# Patient Record
Sex: Female | Born: 1970
Health system: Southern US, Community
[De-identification: ages and names within clinical notes are randomized; demographics above are authoritative.]

## PROBLEM LIST (undated history)

## (undated) DIAGNOSIS — G2581 Restless legs syndrome: Secondary | ICD-10-CM

## (undated) DIAGNOSIS — M1990 Unspecified osteoarthritis, unspecified site: Secondary | ICD-10-CM

## (undated) DIAGNOSIS — K219 Gastro-esophageal reflux disease without esophagitis: Secondary | ICD-10-CM

## (undated) DIAGNOSIS — F319 Bipolar disorder, unspecified: Secondary | ICD-10-CM

## (undated) DIAGNOSIS — M719 Bursopathy, unspecified: Secondary | ICD-10-CM

## (undated) DIAGNOSIS — K589 Irritable bowel syndrome without diarrhea: Secondary | ICD-10-CM

## (undated) DIAGNOSIS — G43909 Migraine, unspecified, not intractable, without status migrainosus: Secondary | ICD-10-CM

## (undated) DIAGNOSIS — E559 Vitamin D deficiency, unspecified: Secondary | ICD-10-CM

## (undated) DIAGNOSIS — M797 Fibromyalgia: Principal | ICD-10-CM

## (undated) DIAGNOSIS — G4761 Periodic limb movement disorder: Secondary | ICD-10-CM

## (undated) DIAGNOSIS — G5622 Lesion of ulnar nerve, left upper limb: Secondary | ICD-10-CM

## (undated) HISTORY — DX: Restless legs syndrome: G25.81

## (undated) HISTORY — PX: ABDOMINAL HYSTERECTOMY: SHX81

## (undated) HISTORY — DX: Unspecified osteoarthritis, unspecified site: M19.90

## (undated) HISTORY — DX: Bursopathy, unspecified: M71.9

## (undated) HISTORY — DX: Periodic limb movement disorder: G47.61

## (undated) HISTORY — DX: Lesion of ulnar nerve, left upper limb: G56.22

## (undated) HISTORY — DX: Vitamin D deficiency, unspecified: E55.9

## (undated) HISTORY — DX: Gastro-esophageal reflux disease without esophagitis: K21.9

## (undated) HISTORY — DX: Fibromyalgia: M79.7

## (undated) HISTORY — DX: Bipolar disorder, unspecified: F31.9

## (undated) HISTORY — DX: Irritable bowel syndrome without diarrhea: K58.9

## (undated) HISTORY — DX: Migraine, unspecified, not intractable, without status migrainosus: G43.909

---

## 1999-03-05 ENCOUNTER — Ambulatory Visit: Admission: RE | Admit: 1999-03-05 | Discharge: 1999-03-05 | Payer: Self-pay | Admitting: Family Medicine

## 1999-12-13 ENCOUNTER — Other Ambulatory Visit: Admission: RE | Admit: 1999-12-13 | Discharge: 1999-12-13 | Payer: Self-pay | Admitting: Family Medicine

## 2000-04-02 ENCOUNTER — Other Ambulatory Visit: Admission: RE | Admit: 2000-04-02 | Discharge: 2000-04-02 | Payer: Self-pay | Admitting: Family Medicine

## 2000-05-27 ENCOUNTER — Encounter: Payer: Self-pay | Admitting: *Deleted

## 2000-05-27 ENCOUNTER — Ambulatory Visit (HOSPITAL_COMMUNITY): Admission: RE | Admit: 2000-05-27 | Discharge: 2000-05-27 | Payer: Self-pay | Admitting: *Deleted

## 2000-09-06 ENCOUNTER — Other Ambulatory Visit: Admission: RE | Admit: 2000-09-06 | Discharge: 2000-09-06 | Payer: Self-pay | Admitting: Family Medicine

## 2001-12-16 ENCOUNTER — Other Ambulatory Visit: Admission: RE | Admit: 2001-12-16 | Discharge: 2001-12-16 | Payer: Self-pay | Admitting: Obstetrics and Gynecology

## 2002-06-11 ENCOUNTER — Other Ambulatory Visit: Admission: RE | Admit: 2002-06-11 | Discharge: 2002-06-11 | Payer: Self-pay | Admitting: Obstetrics and Gynecology

## 2002-10-27 ENCOUNTER — Emergency Department (HOSPITAL_COMMUNITY): Admission: EM | Admit: 2002-10-27 | Discharge: 2002-10-27 | Payer: Self-pay | Admitting: Emergency Medicine

## 2003-06-18 ENCOUNTER — Other Ambulatory Visit: Admission: RE | Admit: 2003-06-18 | Discharge: 2003-06-18 | Payer: Self-pay | Admitting: Obstetrics and Gynecology

## 2003-10-06 ENCOUNTER — Emergency Department (HOSPITAL_COMMUNITY): Admission: EM | Admit: 2003-10-06 | Discharge: 2003-10-06 | Payer: Self-pay | Admitting: Emergency Medicine

## 2004-07-27 ENCOUNTER — Other Ambulatory Visit: Admission: RE | Admit: 2004-07-27 | Discharge: 2004-07-27 | Payer: Self-pay | Admitting: Obstetrics and Gynecology

## 2004-10-03 ENCOUNTER — Encounter (INDEPENDENT_AMBULATORY_CARE_PROVIDER_SITE_OTHER): Payer: Self-pay | Admitting: *Deleted

## 2004-10-03 ENCOUNTER — Ambulatory Visit (HOSPITAL_BASED_OUTPATIENT_CLINIC_OR_DEPARTMENT_OTHER): Admission: RE | Admit: 2004-10-03 | Discharge: 2004-10-03 | Payer: Self-pay | Admitting: Obstetrics and Gynecology

## 2004-10-03 ENCOUNTER — Ambulatory Visit (HOSPITAL_COMMUNITY): Admission: RE | Admit: 2004-10-03 | Discharge: 2004-10-03 | Payer: Self-pay | Admitting: Obstetrics and Gynecology

## 2004-10-24 ENCOUNTER — Encounter (INDEPENDENT_AMBULATORY_CARE_PROVIDER_SITE_OTHER): Payer: Self-pay | Admitting: *Deleted

## 2004-10-24 ENCOUNTER — Inpatient Hospital Stay (HOSPITAL_COMMUNITY): Admission: RE | Admit: 2004-10-24 | Discharge: 2004-10-27 | Payer: Self-pay | Admitting: Obstetrics and Gynecology

## 2005-08-31 ENCOUNTER — Emergency Department (HOSPITAL_COMMUNITY): Admission: EM | Admit: 2005-08-31 | Discharge: 2005-08-31 | Payer: Self-pay | Admitting: Emergency Medicine

## 2005-11-15 ENCOUNTER — Other Ambulatory Visit: Admission: RE | Admit: 2005-11-15 | Discharge: 2005-11-15 | Payer: Self-pay | Admitting: Obstetrics and Gynecology

## 2006-09-26 ENCOUNTER — Encounter: Admission: RE | Admit: 2006-09-26 | Discharge: 2006-09-26 | Payer: Self-pay | Admitting: Gastroenterology

## 2006-11-06 ENCOUNTER — Encounter: Admission: RE | Admit: 2006-11-06 | Discharge: 2006-11-06 | Payer: Self-pay | Admitting: Obstetrics and Gynecology

## 2007-08-23 ENCOUNTER — Emergency Department (HOSPITAL_COMMUNITY): Admission: EM | Admit: 2007-08-23 | Discharge: 2007-08-23 | Payer: Self-pay | Admitting: Emergency Medicine

## 2010-09-15 NOTE — Op Note (Signed)
Felicia Rivera, ROSSA                ACCOUNT NO.:  [id number]   MEDICAL RECORD NO.:  [id number]          PATIENT TYPE:  INP   LOCATION:  9317                          FACILITY:  [facility]   PHYSICIAN:  Huel Cote, M.D. DATE OF BIRTH:  [birth year]   DATE OF PROCEDURE:  10/24/2004  DATE OF DISCHARGE:                                 OPERATIVE REPORT   PREOPERATIVE DIAGNOSES:  1.  Adenocarcinoma in situ of cervix.  2.  Dysmenorrhea.  3.  Menorrhagia.   POSTOPERATIVE DIAGNOSES:  1.  Adenocarcinoma in situ of cervix.  2.  Dysmenorrhea.  3.  Menorrhagia.   PROCEDURE:  Total abdominal hysterectomy.   SURGEON:  Huel Cote, M.D.   ASSISTANT:  Malachi Pro. Ambrose Mantle, M.D.   ANESTHESIA:  General.   SPECIMENS:  Uterus and cervix were sent.   ESTIMATED BLOOD LOSS:  100 cc.   URINE OUTPUT:  125 cc.   FLUIDS REPLACED:  1700 cc.   FINDINGS:  There was a small uterus.  Ovaries, tubes, appendix, and pelvis  were all normal.  There was no pathology noted.  No endometriosis noted.   DESCRIPTION OF PROCEDURE:  The patient was taken to the operating room where  general anesthesia was obtained without difficulty.  She was then prepped  and draped in the usual sterile fashion in the dorsal supine position.  A  Pfannenstiel incision was then made with a scalpel and carried through to  the underlying layer of fascia with Bovie dissection and sharp dissection.  The fascia was nicked in the midline and the incision was extended laterally  with Mayo scissors.  The inferior aspect was grasped with Kocher clamps,  elevated, and dissected off the rectus muscles.  In a similar fashion, the  superior aspect was dissected off the rectus muscles.  The rectus muscles  were separated and the peritoneal cavity entered bluntly.  The peritoneal  incision was then extended both superiorly and inferiorly with careful  attention to avoid both bowel and bladder.  The wound retractor, Alexis self-  retaining  retractor, was then placed in the incision and the bowel packed  away with moist laparotomy sponges.  The uterus and ovaries were inspected  and found to be normal in appearance.  Therefore, the uterus was grasped at  each cornu with a Kelly clamp.  The round ligament was transected with Bovie  cautery.  A window was made in the broad ligament and the Kleppinger clamps  placed across the utero-ovarian ligaments.  The utero-ovarian ligaments were  then transected bilaterally and suture ligated x2 with 0 Vicryl.  These were  found to be hemostatic.  The uterine arteries were then skeletonized  bilaterally and clamped with Kleppinger clamps.  The bladder flap was  created with Metzenbaum scissors and pushed away from the underlying cervix.  The uterine arteries were then transected and secured with a suture ligature  of 0 Vicryl.  Additional clamps of the uterosacral ligament and remaining  paracervical tissue were taken with slightly curved Kleppinger clamps.  Each  step was secured with a suture of 0 Vicryl.  The cervix was then clamped  across with slightly curved Zeplin clamps and completely amputated.  The  cuff was then closed with 0 Vicryl suture ligatures and the midline closed  with a figure-of-eight suture as well.  There was no active bleeding noted  at this point.  The ureters were inspected and found to be normal in caliber  and well away from the surgical field and, therefore, the uterosacral  ligaments were reapproximated with a suture of 0 Vicryl.  All sutures were  then removed from the abdomen.  All instruments were removed including the  retractor and one area of bleeding controlled on the omentum with Bovie  cautery.  The fascia was then closed with 0 Vicryl in a running fashion.  The subcutaneous tissue was reapproximated with 0 plain and the skin was  closed with staples.  Sponge, laparotomy pad, and needle counts were correct  x2.  The patient was taken to the recovery  room in stable condition.       KR/MEDQ  D:  10/24/2004  T:  10/24/2004  Job:  [id number]

## 2010-09-15 NOTE — Discharge Summary (Signed)
Felicia Rivera, PEPPERMAN                ACCOUNT NO.:  [id number]   MEDICAL RECORD NO.:  [id number]          PATIENT TYPE:  INP   LOCATION:  9317                          FACILITY:  [facility]   PHYSICIAN:  Huel Cote, M.D. DATE OF BIRTH:  June 18, 1970   DATE OF ADMISSION:  10/24/2004  DATE OF DISCHARGE:  10/27/2004                                 DISCHARGE SUMMARY   DISCHARGE DIAGNOSES:  1.  Adenocarcinoma in situ of the cervix status post cold knife conization      with a questionable margin.  2.  Menorrhagia and dysmenorrhea.  3.  Status post total abdominal hysterectomy.   DISCHARGE MEDICATIONS:  1.  Percocet one to two tablets p.o. every 4 hours p.r.n.  2.  Motrin 600 mg p.o. every 6 hours p.r.n.  3.  The patient also will resume several preoperative medications, Effexor      and Klonopin, as she had previously took them.   DISCHARGE FOLLOWUP:  The patient is to follow up in the office in  approximately 2 weeks for incision check.   HOSPITAL COURSE:  The patient is a 40 year old nulligravida female who  underwent a total abdominal hysterectomy on October 24, 2004 for a finding of  adenocarcinoma in situ of the cervix which was questionably still present on  a cold knife conization. The patient also had a significant history of  menorrhagia and dysmenorrhea and for this reason had requested an abdominal  hysterectomy to treat both that condition and the possible residual  adenocarcinoma in situ. She underwent a total abdominal hysterectomy without  difficulty and was admitted for routine postoperative care. She did  extremely well and on postoperative day #3 was tolerating regular diet,  ambulating without difficulty. Abdomen was soft. Her incision was clear with  Steri-Strips. Her postoperative hemoglobin was 10.7 and she was felt stable  for discharge home. She was discharged home with follow-up as previously  stated and will be seen in the office at that time.      Huel Cote, M.D.  Electronically Signed     KR/MEDQ  D:  12/12/2004  T:  12/12/2004  Job:  [id number]

## 2010-09-15 NOTE — H&P (Signed)
NAME:  Felicia, Rivera NO.:  [id number]   MEDICAL RECORD NO.:  [id number]           PATIENT TYPE:   LOCATION:                                 FACILITY:   PHYSICIAN:  Huel Cote, M.D.      DATE OF BIRTH:   DATE OF ADMISSION:  10/24/2004  DATE OF DISCHARGE:                                HISTORY & PHYSICAL   HISTORY OF PRESENT ILLNESS:  The patient is a 40 year old nulligravida  female who has come again for a scheduled total abdominal hysterectomy,  given a finding of adenocarcinoma in situ on her cervix.  The patient has  had this followed up with a cold knife conization which had a questionable  margin in the deep endocervix area.  Previous to her cold knife cone, the  patient had expressed a desire to proceeding with hysterectomy, irregardless  of her pathological diagnosis on the conization, not only for prevention of  any further cervical cancer issues, but also for a long history of  dysmenorrhea and menorrhagia, which has not responded well to medical  treatment.   PAST MEDICAL HISTORY:  1.  Significant for migraines.  2.  Fibromyalgia.  3.  Restless leg syndrome.   PAST SURGICAL HISTORY:  1.  She has had her wisdom teeth removed.  2.  The cold knife conization on October 03, 2004.   OBSTETRICAL HISTORY:  None.   GYNECOLOGICAL HISTORY:  The AGUS Pap smear was noted with colposcopy and  cold knife conization confirming adenocarcinoma in situ of the endocervix.   FAMILY HISTORY:  Significant for breast cancer in her mother in her 48s.   CURRENT MEDICATIONS:  1.  Effexor.  2.  Klonopin.  3.  Celebrex.  4.  Darvocet.  5.  Midrin.  6.  Lo/Ovral continuously.   ALLERGIES:  She has no known drug allergies.   PHYSICAL EXAMINATION:  VITAL SIGNS:  Her weight is 120 pounds.  Blood  pressure is 115/70.  CARDIAC:  Regular rate and rhythm.  LUNGS:  Clear.  ABDOMEN:  Soft and nontender.  PELVIC:  She has normal genitalia noted.  The uterus is small  and  anteverted.  No adnexal masses.  She had a cold knife conization, which was  well healing at her postoperative appointment; however, the cervix does  deviate substantially to the patient's right, making visualization somewhat  difficult.   The patient was counseled of the risks and benefits of proceeding with  surgery including bleeding, infection, possible damage to bowel and bladder.  At the time of her conization, it was noted that her cervix was very soft in  consistency and was difficult to place downward traction on without pulling  through.  Also, with her nulligravida status and the desire to acquire  negative margins on her cervical tissue, the decision was made to proceed  abdominally.  The patient understands the risks and benefits, and will  proceed as stated.       KR/MEDQ  D:  10/19/2004  T:  10/19/2004  Job:  [id number]

## 2010-09-15 NOTE — H&P (Signed)
NAMECARTER, Felicia Rivera NO.:  [id number]   MEDICAL RECORD NO.:  [id number]          PATIENT TYPE:  AMB   LOCATION:  [facility]                         FACILITY:  [facility]   PHYSICIAN:  Huel Cote, M.D. DATE OF BIRTH:  [birth year]   DATE OF ADMISSION:  10/03/2004  DATE OF DISCHARGE:                                HISTORY & PHYSICAL   Surgery is scheduled for October 03, 2004 at 10:30 a.m.   The patient is a 40 year old nulligravida female who is coming in for a cold  knife conization, given a recent finding of adenocarcinoma in situ on a  colposcopy specimen.  The patient had a Pap smear, which was consistent with  AGUS and had a follow-up colposcopy and endometrial biopsy.  Her endometrial  biopsy returned normal; however, she did have adenocarcinoma in situ on a  cervical biopsy.  On this same specimen, she had an insufficient  endocervical curettage.  For that reason, we are proceeding with a cold  knife conization to rule out any invasive disease prior to a probable  treatment with hysterectomy, per the patient's request.  The patient also  has a history of significant dysmenorrhea and menorrhagia; for which she is  on continuous birth control, and for this reason, the patient desires to  proceed with hysterectomy to treat both her cycle problems and  adenocarcinoma in situ.   PAST MEDICAL HISTORY:  1.  Migraines.  2.  Fibromyalgia.  3.  Restless legs syndrome.   PAST SURGICAL HISTORY:  She has only wisdom teeth removal.   OBSTETRICAL HISTORY:  None.   GYNECOLOGIC HISTORY:  History of the AGUS Pap smear and history of low-grade  SIL on previous Pap smears with a current workup consistent with  adenocarcinoma in situ.   FAMILY HISTORY:  Patient has breast cancer in her mother in her 8s.  No  colon cancer or heart disease.   CURRENT MEDICATIONS:  Include Effexor, Klonopin, Celebrex, Darvocet, Midrin,  and Lo-Ovral continuously.   She has no known drug  allergies.   PHYSICAL EXAMINATION:  VITAL SIGNS:  Weight is 120 pounds.  Blood pressure  120/80.  LUNGS:  Clear.  CARDIAC:  Regular rate and rhythm.  ABDOMEN:  Soft and nontender.  PELVIC:  She has normal external genitalia.  Uterus is small and anteverted.  Adnexa have no masses.  Her cervical colposcopy was performed with a biopsy  done in approximately the 7 o'clock position, which revealed the  adenocarcinoma in situ.  The cervix was deviated to the patient's right,  making visualization somewhat difficult.  Given her recent findings of  adenocarcinoma in situ, all issues were discussed with the patient in  detail, and she strongly wants to proceed with hysterectomy.   This was case was discussed with Dr. De Blanch, who agreed that  she should have a cold knife conization prior to hysterectomy to rule out  any invasive disease further back in the canal, given an inadequate  endocervical curettage.  Patient was counseled of the risks and benefits of  proceeding with cold knife conization, including bleeding, infection,  and  desires to proceed with the surgery as stated.  We will undergo this  procedure, and pending the pathology, likely be scheduling a hysterectomy in  the near future.       KR/MEDQ  D:  10/02/2004  T:  10/02/2004  Job:  [id number]

## 2010-09-15 NOTE — Op Note (Signed)
NAMECYNETHIA, Felicia Rivera                ACCOUNT NO.:  [id number]   MEDICAL RECORD NO.:  [id number]          PATIENT TYPE:  AMB   LOCATION:  N[facility]C                         FACILITY:  [facility]   PHYSICIAN:  Huel Cote, M.D. DATE OF BIRTH:  [birth year]   DATE OF PROCEDURE:  10/03/2004  DATE OF DISCHARGE:                                 OPERATIVE REPORT   PREOPERATIVE DIAGNOSIS:  Adenocarcinoma in situ of cervix.   POSTOPERATIVE DIAGNOSIS:  Adenocarcinoma in situ of cervix, pending final  pathology.   PROCEDURE:  Cold knife conization.   SURGEON:  Dr. Huel Cote.   ANESTHESIA:  General.   SPECIMENS:  Ectocervical colon and endocervical fragments were sent.   ESTIMATED BLOOD LOSS:  100 cc.   COMPLICATIONS:  None.   FINDINGS:  Cervix was small in caliber and the ectocervical specimen  obtained in entirety. The endocervix itself was noted to be quite friable  and mushy in content and was very difficult to obtain one cohesive piece.  Therefore, multiple fragments were obtained and sent to final pathology.  These were mixed with endocervical mucus as well.   PROCEDURE:  The patient was taken to operating room where LMA anesthesia was  obtained without difficulty. She was then prepped and draped in the normal  sterile fashion in dorsal lithotomy position.  A speculum was placed within  the vagina. The cervix identified and grasped with single-tooth tenaculum on  the anterior lip. Two stay sutures of 0 Vicryl were placed, both at the 2  o'clock and 10 o'clock position, and used for retraction on the cervix. The  cervix itself was then incised with the scalpel and a cone shaped specimen  surrounding the ectocervical os. The ectocervical portion came off in one  specimen. However, the endocervical portion, as stated, was quite difficult  to remove in entirety due to its consistency. Therefore, the ectocervical  specimen was removed, handed off to pathology, and remaining endocervical  fragments were taken both sharply and bluntly with pickups. These were sent  also and the cone base carefully inspected. There was some active bleeding  in the base which was controlled with a suture on the anterior lip of the  cervix. The remainder of the base was cauterized with rollerball cautery  extensively. One additional area of bleeding was controlled with figure-of-  eight suture of 0 Vicryl. At the conclusion of the  procedure, there was no active bleeding noted.  An additional layer of  Monsel solution was placed within the cervical bed to assist with any  postoperative bleeding. At this point, the stay sutures were removed and no  active bleeding noted. Therefore, the patient was awakened and taken to the  recovery room in stable condition.       KR/MEDQ  D:  10/03/2004  T:  10/03/2004  Job:  [id number]

## 2010-11-01 ENCOUNTER — Emergency Department (HOSPITAL_COMMUNITY)
Admission: EM | Admit: 2010-11-01 | Discharge: 2010-11-01 | Disposition: A | Discharge status: Home / Self Care | Payer: BC Managed Care – PPO | Attending: Emergency Medicine | Admitting: Emergency Medicine

## 2010-11-01 ENCOUNTER — Emergency Department (HOSPITAL_COMMUNITY): Payer: BC Managed Care – PPO

## 2010-11-01 DIAGNOSIS — K5900 Constipation, unspecified: Secondary | ICD-10-CM | POA: Insufficient documentation

## 2010-11-01 DIAGNOSIS — R109 Unspecified abdominal pain: Secondary | ICD-10-CM | POA: Insufficient documentation

## 2010-11-01 DIAGNOSIS — R197 Diarrhea, unspecified: Secondary | ICD-10-CM | POA: Insufficient documentation

## 2010-11-01 DIAGNOSIS — F341 Dysthymic disorder: Secondary | ICD-10-CM | POA: Insufficient documentation

## 2010-11-01 DIAGNOSIS — IMO0001 Reserved for inherently not codable concepts without codable children: Secondary | ICD-10-CM | POA: Insufficient documentation

## 2010-11-01 DIAGNOSIS — R112 Nausea with vomiting, unspecified: Secondary | ICD-10-CM | POA: Insufficient documentation

## 2010-11-01 DIAGNOSIS — K5289 Other specified noninfective gastroenteritis and colitis: Secondary | ICD-10-CM | POA: Insufficient documentation

## 2010-11-01 LAB — DIFFERENTIAL
Basophils Absolute: 0 10*3/uL (ref 0.0–0.1)
Eosinophils Relative: 2 % (ref 0–5)
Lymphocytes Relative: 13 % (ref 12–46)
Lymphs Abs: 1.6 10*3/uL (ref 0.7–4.0)
Monocytes Absolute: 0.7 10*3/uL (ref 0.1–1.0)
Neutro Abs: 9.5 10*3/uL — ABNORMAL HIGH (ref 1.7–7.7)

## 2010-11-01 LAB — CBC
HCT: 39.5 % (ref 36.0–46.0)
Hemoglobin: 13.1 g/dL (ref 12.0–15.0)
MCV: 89.6 fL (ref 78.0–100.0)
RDW: 12.3 % (ref 11.5–15.5)
WBC: 12 10*3/uL — ABNORMAL HIGH (ref 4.0–10.5)

## 2010-11-01 LAB — COMPREHENSIVE METABOLIC PANEL
Alkaline Phosphatase: 65 U/L (ref 39–117)
BUN: 12 mg/dL (ref 6–23)
Chloride: 104 mEq/L (ref 96–112)
Creatinine, Ser: 1.04 mg/dL (ref 0.50–1.10)
GFR calc Af Amer: >60 mL/min (ref >60)
GFR calc non Af Amer: 59 mL/min — ABNORMAL LOW (ref >60)
Glucose, Bld: 131 mg/dL — ABNORMAL HIGH (ref 70–99)
Potassium: 3.5 mEq/L (ref 3.5–5.1)
Total Bilirubin: 0.4 mg/dL (ref 0.3–1.2)

## 2011-01-23 LAB — URINALYSIS, ROUTINE W REFLEX MICROSCOPIC
Ketones, ur: 15 — AB
Nitrite: NEGATIVE
Protein, ur: NEGATIVE
Urobilinogen, UA: 0.2

## 2011-01-23 LAB — DIFFERENTIAL
Basophils Relative: 0
Eosinophils Absolute: 0.1
Lymphs Abs: 0.8
Neutro Abs: 11.3 — ABNORMAL HIGH
Neutrophils Relative %: 88 — ABNORMAL HIGH

## 2011-01-23 LAB — BASIC METABOLIC PANEL
BUN: 16
Calcium: 10.1
Chloride: 104
Creatinine, Ser: 1
GFR calc Af Amer: >60

## 2011-01-23 LAB — CBC
MCV: 90
Platelets: 236
WBC: 12.8 — ABNORMAL HIGH

## 2011-05-23 ENCOUNTER — Other Ambulatory Visit: Payer: Self-pay | Admitting: Obstetrics and Gynecology

## 2011-05-23 DIAGNOSIS — Z1231 Encounter for screening mammogram for malignant neoplasm of breast: Secondary | ICD-10-CM

## 2011-06-07 ENCOUNTER — Ambulatory Visit
Admission: RE | Admit: 2011-06-07 | Discharge: 2011-06-07 | Disposition: A | Discharge status: Home / Self Care | Payer: BC Managed Care – PPO | Source: Ambulatory Visit | Attending: Obstetrics and Gynecology | Admitting: Obstetrics and Gynecology

## 2011-06-07 DIAGNOSIS — Z1231 Encounter for screening mammogram for malignant neoplasm of breast: Secondary | ICD-10-CM

## 2011-06-27 ENCOUNTER — Other Ambulatory Visit: Payer: Self-pay | Admitting: Orthopaedic Surgery

## 2011-06-27 DIAGNOSIS — M25511 Pain in right shoulder: Secondary | ICD-10-CM

## 2011-07-05 ENCOUNTER — Other Ambulatory Visit: Payer: BC Managed Care – PPO

## 2012-05-27 ENCOUNTER — Encounter (HOSPITAL_COMMUNITY): Payer: Self-pay | Admitting: Emergency Medicine

## 2012-05-27 ENCOUNTER — Emergency Department (HOSPITAL_COMMUNITY)
Admission: EM | Admit: 2012-05-27 | Discharge: 2012-05-27 | Disposition: A | Discharge status: Home / Self Care | Payer: BC Managed Care – PPO | Attending: Emergency Medicine | Admitting: Emergency Medicine

## 2012-05-27 DIAGNOSIS — R5381 Other malaise: Secondary | ICD-10-CM | POA: Diagnosis not present

## 2012-05-27 DIAGNOSIS — R404 Transient alteration of awareness: Secondary | ICD-10-CM | POA: Diagnosis not present

## 2012-05-27 DIAGNOSIS — R109 Unspecified abdominal pain: Secondary | ICD-10-CM | POA: Insufficient documentation

## 2012-05-27 DIAGNOSIS — Z8541 Personal history of malignant neoplasm of cervix uteri: Secondary | ICD-10-CM | POA: Insufficient documentation

## 2012-05-27 DIAGNOSIS — E860 Dehydration: Secondary | ICD-10-CM

## 2012-05-27 DIAGNOSIS — Z79899 Other long term (current) drug therapy: Secondary | ICD-10-CM | POA: Insufficient documentation

## 2012-05-27 LAB — CBC
Platelets: 181 10*3/uL (ref 150–400)
RBC: 4.17 MIL/uL (ref 3.87–5.11)
RDW: 12.5 % (ref 11.5–15.5)
WBC: 6.4 10*3/uL (ref 4.0–10.5)

## 2012-05-27 LAB — URINALYSIS, ROUTINE W REFLEX MICROSCOPIC
Glucose, UA: NEGATIVE mg/dL
Leukocytes, UA: NEGATIVE
Protein, ur: NEGATIVE mg/dL
Specific Gravity, Urine: 1.011 (ref 1.005–1.030)
Urobilinogen, UA: 0.2 mg/dL (ref 0.0–1.0)

## 2012-05-27 LAB — COMPREHENSIVE METABOLIC PANEL
ALT: 5 U/L (ref 0–35)
AST: 15 U/L (ref 0–37)
Albumin: 3.9 g/dL (ref 3.5–5.2)
Alkaline Phosphatase: 58 U/L (ref 39–117)
Chloride: 104 mEq/L (ref 96–112)
Potassium: 3.8 mEq/L (ref 3.5–5.1)
Total Bilirubin: 0.3 mg/dL (ref 0.3–1.2)

## 2012-05-27 MED ORDER — SODIUM CHLORIDE 0.9 % IV BOLUS (SEPSIS)
1000.0000 mL | Freq: Once | INTRAVENOUS | Status: AC
  Administered 2012-05-27: 1000 mL via INTRAVENOUS

## 2012-05-27 MED ORDER — SODIUM CHLORIDE 0.9 % IV BOLUS (SEPSIS): 500.0000 mL | Freq: Once | INTRAVENOUS | Status: DC

## 2012-05-27 MED ORDER — BENZTROPINE MESYLATE 1 MG PO TABS: 0.5000 mg | ORAL_TABLET | Freq: Every day | ORAL | Status: DC

## 2012-05-27 NOTE — ED Notes (Signed)
XBM:[id number]<KG> Expected date:<BR> Expected time:<BR> Means of arrival:<BR> Comments:<BR> ems-weakness

## 2012-05-27 NOTE — ED Notes (Signed)
MD at bedside. 

## 2012-05-27 NOTE — ED Notes (Signed)
Attempted a urine sample but it was unsuccessful will try again. Pt is aware that a urine sample is needed.

## 2012-05-27 NOTE — ED Notes (Signed)
PER EMS- pt picked up from [facility], with c/o dehydration. Pt has also been difficulty voiding x17 hours, and feels weak and dizzy since this morning.

## 2012-05-27 NOTE — ED Provider Notes (Addendum)
History     CSN: [id number]  Arrival date & time 05/27/12  1415   First MD Initiated Contact with Patient 05/27/12 1457      Chief Complaint  Patient presents with  . Dehydration    (Consider location/radiation/quality/duration/timing/severity/associated sxs/prior treatment) The history is provided by the patient.  pt states since this morning has had pain left flank. Constant. Dull. Moderate. No specific exacerbating or alleviating factors. Non radiating. States has had in past, but unsure of cause. No injury or strain. No skin changes, lesions or erythema to area. No hx kidney stones or kidney infection. Pain is not pleuritic. No cough or uri c/o. No chest pain. No vaginal discharge or bleeding. Hx hysterectomy due to cervical ca, states clear for 5 years. No dysuria or hematuria. No nv.   History reviewed. No pertinent past medical history.  History reviewed. No pertinent past surgical history.  No family history on file.  History  Substance Use Topics  . Smoking status: Not on file  . Smokeless tobacco: Not on file  . Alcohol Use: Not on file    OB History    Grav Para Term Preterm Abortions TAB SAB Ect Mult Living                  Review of Systems  Constitutional: Negative for fever and chills.  HENT: Negative for neck pain.   Eyes: Negative for redness.  Respiratory: Negative for cough and shortness of breath.   Cardiovascular: Negative for chest pain and leg swelling.  Gastrointestinal: Negative for vomiting, abdominal pain and diarrhea.  Genitourinary: Positive for flank pain. Negative for dysuria, hematuria, vaginal bleeding and vaginal discharge.  Musculoskeletal: Negative for back pain.  Skin: Negative for rash.  Neurological: Negative for headaches.  Hematological: Does not bruise/bleed easily.  Psychiatric/Behavioral: Negative for confusion.    Allergies  Review of patient's allergies indicates no known allergies.  Home Medications   Current  Outpatient Rx  Name  Route  Sig  Dispense  Refill  . BENZTROPINE MESYLATE 1 MG PO TABS   Oral   Take 1 mg by mouth 2 (two) times daily.         Marland Kitchen CLONAZEPAM 1 MG PO TABS   Oral   Take 1 mg by mouth 3 (three) times daily as needed. Anxiety         . LAMOTRIGINE 200 MG PO TABS   Oral   Take 200 mg by mouth daily.         Marland Kitchen LORATADINE 10 MG PO TABS   Oral   Take 10 mg by mouth daily.         . QUETIAPINE FUMARATE 300 MG PO TABS   Oral   Take 300 mg by mouth at bedtime.         . TOPIRAMATE 25 MG PO TABS   Oral   Take 75 mg by mouth at bedtime.           BP 104/74  Pulse 86  Temp 98.2 F (36.8 C) (Oral)  Resp 18  SpO2 100%  Physical Exam  Nursing note and vitals reviewed. Constitutional: She is oriented to person, place, and time. She appears well-developed. No distress.  HENT:  Nose: Nose normal.  Mouth/Throat: Oropharynx is clear and moist.  Eyes: Conjunctivae normal are normal. Pupils are equal, round, and reactive to light. No scleral icterus.  Neck: Neck supple. No tracheal deviation present. No thyromegaly present.  Cardiovascular: Normal rate, regular rhythm,  normal heart sounds and intact distal pulses.   Pulmonary/Chest: Effort normal and breath sounds normal. No respiratory distress.  Abdominal: Soft. Normal appearance and bowel sounds are normal. She exhibits no distension and no mass. There is no tenderness. There is no rebound and no guarding.  Genitourinary:       No cva tenderness.  Musculoskeletal: She exhibits no edema and no tenderness.  Neurological: She is alert and oriented to person, place, and time.  Skin: Skin is warm and dry. No rash noted. She is not diaphoretic.       No skin changes or rash in area of pain  Psychiatric: She has a normal mood and affect.    ED Course  Procedures (including critical care time)   Results for orders placed during the hospital encounter of 05/27/12  URINALYSIS, ROUTINE W REFLEX MICROSCOPIC       Component Value Range   Color, Urine YELLOW  YELLOW   APPearance CLOUDY (*) CLEAR   Specific Gravity, Urine 1.011  1.005 - 1.030   pH 6.5  5.0 - 8.0   Glucose, UA NEGATIVE  NEGATIVE mg/dL   Hgb urine dipstick NEGATIVE  NEGATIVE   Bilirubin Urine NEGATIVE  NEGATIVE   Ketones, ur NEGATIVE  NEGATIVE mg/dL   Protein, ur NEGATIVE  NEGATIVE mg/dL   Urobilinogen, UA 0.2  0.0 - 1.0 mg/dL   Nitrite NEGATIVE  NEGATIVE   Leukocytes, UA NEGATIVE  NEGATIVE  CBC      Component Value Range   WBC 6.4  4.0 - 10.5 K/uL   RBC 4.17  3.87 - 5.11 MIL/uL   Hemoglobin 12.6  12.0 - 15.0 g/dL   HCT 16.1  09.6 - 04.5 %   MCV 90.4  78.0 - 100.0 fL   MCH 30.2  26.0 - 34.0 pg   MCHC 33.4  30.0 - 36.0 g/dL   RDW 40.9  81.1 - 91.4 %   Platelets 181  150 - 400 K/uL  COMPREHENSIVE METABOLIC PANEL      Component Value Range   Sodium 138  135 - 145 mEq/L   Potassium 3.8  3.5 - 5.1 mEq/L   Chloride 104  96 - 112 mEq/L   CO2 24  19 - 32 mEq/L   Glucose, Bld 95  70 - 99 mg/dL   BUN 8  6 - 23 mg/dL   Creatinine, Ser 7.82 (*) 0.50 - 1.10 mg/dL   Calcium 9.2  8.4 - 95.6 mg/dL   Total Protein 7.1  6.0 - 8.3 g/dL   Albumin 3.9  3.5 - 5.2 g/dL   AST 15  0 - 37 U/L   ALT 5  0 - 35 U/L   Alkaline Phosphatase 58  39 - 117 U/L   Total Bilirubin 0.3  0.3 - 1.2 mg/dL   GFR calc non Af Amer 48 (*) >90 mL/min   GFR calc Af Amer 56 (*) >90 mL/min       MDM  Iv ns bolus.   Labs.  Reviewed nursing notes and prior charts for additional history.    2.5 liters fluid given in ed.  Pt taking po fluids.  Urinated x 2 in ed. No nvd on recheck. Denies any abd or flank pain. abd soft nt on recheck.  Tolerating fluids well, states feels much better, ready for d/c.   Given only recent new med is cogentin, and ?whether antichol effects related to current presentation (pt takes 1 mg bid), discussed decreasing dose to .5  mg qhs, and close f/u with her doctor.      Suzi Roots, MD 05/27/12 1856  Suzi Roots,  MD 05/27/12 [phone]

## 2012-06-18 DIAGNOSIS — T887XXA Unspecified adverse effect of drug or medicament, initial encounter: Secondary | ICD-10-CM | POA: Diagnosis not present

## 2012-06-18 DIAGNOSIS — H6120 Impacted cerumen, unspecified ear: Secondary | ICD-10-CM | POA: Diagnosis not present

## 2012-06-18 DIAGNOSIS — R42 Dizziness and giddiness: Secondary | ICD-10-CM | POA: Diagnosis not present

## 2012-06-18 DIAGNOSIS — I951 Orthostatic hypotension: Secondary | ICD-10-CM | POA: Diagnosis not present

## 2012-07-03 DIAGNOSIS — F3161 Bipolar disorder, current episode mixed, mild: Secondary | ICD-10-CM | POA: Diagnosis not present

## 2012-07-17 DIAGNOSIS — F3161 Bipolar disorder, current episode mixed, mild: Secondary | ICD-10-CM | POA: Diagnosis not present

## 2012-07-31 DIAGNOSIS — F3161 Bipolar disorder, current episode mixed, mild: Secondary | ICD-10-CM | POA: Diagnosis not present

## 2012-08-14 DIAGNOSIS — F3161 Bipolar disorder, current episode mixed, mild: Secondary | ICD-10-CM | POA: Diagnosis not present

## 2012-09-04 DIAGNOSIS — F3161 Bipolar disorder, current episode mixed, mild: Secondary | ICD-10-CM | POA: Diagnosis not present

## 2012-09-17 DIAGNOSIS — M25579 Pain in unspecified ankle and joints of unspecified foot: Secondary | ICD-10-CM | POA: Diagnosis not present

## 2012-09-17 DIAGNOSIS — R5381 Other malaise: Secondary | ICD-10-CM | POA: Diagnosis not present

## 2012-09-17 DIAGNOSIS — IMO0001 Reserved for inherently not codable concepts without codable children: Secondary | ICD-10-CM | POA: Diagnosis not present

## 2012-09-17 DIAGNOSIS — R5383 Other fatigue: Secondary | ICD-10-CM | POA: Diagnosis not present

## 2012-09-17 DIAGNOSIS — G4700 Insomnia, unspecified: Secondary | ICD-10-CM | POA: Diagnosis not present

## 2012-09-25 DIAGNOSIS — F3161 Bipolar disorder, current episode mixed, mild: Secondary | ICD-10-CM | POA: Diagnosis not present

## 2012-12-23 ENCOUNTER — Other Ambulatory Visit: Payer: Self-pay

## 2012-12-23 DIAGNOSIS — Z1231 Encounter for screening mammogram for malignant neoplasm of breast: Secondary | ICD-10-CM

## 2013-01-14 ENCOUNTER — Ambulatory Visit
Admission: RE | Admit: 2013-01-14 | Discharge: 2013-01-14 | Disposition: A | Discharge status: Home / Self Care | Payer: Medicare Other | Source: Ambulatory Visit

## 2013-01-14 DIAGNOSIS — Z1231 Encounter for screening mammogram for malignant neoplasm of breast: Secondary | ICD-10-CM

## 2013-03-18 DIAGNOSIS — IMO0001 Reserved for inherently not codable concepts without codable children: Secondary | ICD-10-CM | POA: Diagnosis not present

## 2013-03-18 DIAGNOSIS — M25579 Pain in unspecified ankle and joints of unspecified foot: Secondary | ICD-10-CM | POA: Diagnosis not present

## 2013-03-18 DIAGNOSIS — M357 Hypermobility syndrome: Secondary | ICD-10-CM | POA: Diagnosis not present

## 2013-03-18 DIAGNOSIS — R5381 Other malaise: Secondary | ICD-10-CM | POA: Diagnosis not present

## 2013-03-30 DIAGNOSIS — Z8541 Personal history of malignant neoplasm of cervix uteri: Secondary | ICD-10-CM | POA: Diagnosis not present

## 2013-03-30 DIAGNOSIS — Z0000 Encounter for general adult medical examination without abnormal findings: Secondary | ICD-10-CM | POA: Diagnosis not present

## 2013-03-30 DIAGNOSIS — Z01419 Encounter for gynecological examination (general) (routine) without abnormal findings: Secondary | ICD-10-CM | POA: Diagnosis not present

## 2013-03-30 DIAGNOSIS — R87620 Atypical squamous cells of undetermined significance on cytologic smear of vagina (ASC-US): Secondary | ICD-10-CM | POA: Diagnosis not present

## 2013-03-31 DIAGNOSIS — K219 Gastro-esophageal reflux disease without esophagitis: Secondary | ICD-10-CM | POA: Diagnosis not present

## 2013-03-31 DIAGNOSIS — K589 Irritable bowel syndrome without diarrhea: Secondary | ICD-10-CM | POA: Diagnosis not present

## 2013-03-31 DIAGNOSIS — R112 Nausea with vomiting, unspecified: Secondary | ICD-10-CM | POA: Diagnosis not present

## 2013-03-31 DIAGNOSIS — R197 Diarrhea, unspecified: Secondary | ICD-10-CM | POA: Diagnosis not present

## 2013-08-27 DIAGNOSIS — M461 Sacroiliitis, not elsewhere classified: Secondary | ICD-10-CM | POA: Diagnosis not present

## 2013-08-27 DIAGNOSIS — R5381 Other malaise: Secondary | ICD-10-CM | POA: Diagnosis not present

## 2013-08-27 DIAGNOSIS — IMO0001 Reserved for inherently not codable concepts without codable children: Secondary | ICD-10-CM | POA: Diagnosis not present

## 2013-08-27 DIAGNOSIS — R5383 Other fatigue: Secondary | ICD-10-CM | POA: Diagnosis not present

## 2013-08-27 DIAGNOSIS — G4700 Insomnia, unspecified: Secondary | ICD-10-CM | POA: Diagnosis not present

## 2013-09-02 DIAGNOSIS — G43009 Migraine without aura, not intractable, without status migrainosus: Secondary | ICD-10-CM | POA: Diagnosis not present

## 2013-09-02 DIAGNOSIS — J309 Allergic rhinitis, unspecified: Secondary | ICD-10-CM | POA: Diagnosis not present

## 2013-09-02 DIAGNOSIS — R21 Rash and other nonspecific skin eruption: Secondary | ICD-10-CM | POA: Diagnosis not present

## 2013-09-24 DIAGNOSIS — J45909 Unspecified asthma, uncomplicated: Secondary | ICD-10-CM | POA: Diagnosis not present

## 2013-09-24 DIAGNOSIS — J310 Chronic rhinitis: Secondary | ICD-10-CM | POA: Diagnosis not present

## 2013-09-24 DIAGNOSIS — H1045 Other chronic allergic conjunctivitis: Secondary | ICD-10-CM | POA: Diagnosis not present

## 2013-09-24 DIAGNOSIS — R1110 Vomiting, unspecified: Secondary | ICD-10-CM | POA: Diagnosis not present

## 2013-09-28 DIAGNOSIS — L708 Other acne: Secondary | ICD-10-CM | POA: Diagnosis not present

## 2013-09-28 DIAGNOSIS — L259 Unspecified contact dermatitis, unspecified cause: Secondary | ICD-10-CM | POA: Diagnosis not present

## 2013-09-28 DIAGNOSIS — D235 Other benign neoplasm of skin of trunk: Secondary | ICD-10-CM | POA: Diagnosis not present

## 2013-09-28 DIAGNOSIS — L719 Rosacea, unspecified: Secondary | ICD-10-CM | POA: Diagnosis not present

## 2014-01-26 ENCOUNTER — Other Ambulatory Visit: Payer: Self-pay

## 2014-01-26 DIAGNOSIS — Z1231 Encounter for screening mammogram for malignant neoplasm of breast: Secondary | ICD-10-CM

## 2014-02-16 ENCOUNTER — Ambulatory Visit
Admission: RE | Admit: 2014-02-16 | Discharge: 2014-02-16 | Disposition: A | Discharge status: Home / Self Care | Payer: Medicare Other | Source: Ambulatory Visit

## 2014-02-16 DIAGNOSIS — Z1231 Encounter for screening mammogram for malignant neoplasm of breast: Secondary | ICD-10-CM

## 2014-02-19 DIAGNOSIS — R5383 Other fatigue: Secondary | ICD-10-CM | POA: Diagnosis not present

## 2014-02-19 DIAGNOSIS — R635 Abnormal weight gain: Secondary | ICD-10-CM | POA: Diagnosis not present

## 2014-02-24 DIAGNOSIS — R109 Unspecified abdominal pain: Secondary | ICD-10-CM | POA: Diagnosis not present

## 2014-02-25 DIAGNOSIS — G4700 Insomnia, unspecified: Secondary | ICD-10-CM | POA: Diagnosis not present

## 2014-02-25 DIAGNOSIS — M357 Hypermobility syndrome: Secondary | ICD-10-CM | POA: Diagnosis not present

## 2014-02-25 DIAGNOSIS — M461 Sacroiliitis, not elsewhere classified: Secondary | ICD-10-CM | POA: Diagnosis not present

## 2014-02-25 DIAGNOSIS — M6080 Other myositis, unspecified site: Secondary | ICD-10-CM | POA: Diagnosis not present

## 2014-04-02 DIAGNOSIS — Z8541 Personal history of malignant neoplasm of cervix uteri: Secondary | ICD-10-CM | POA: Diagnosis not present

## 2014-04-02 DIAGNOSIS — Z0142 Encounter for cervical smear to confirm findings of recent normal smear following initial abnormal smear: Secondary | ICD-10-CM | POA: Diagnosis not present

## 2014-04-02 DIAGNOSIS — Z0000 Encounter for general adult medical examination without abnormal findings: Secondary | ICD-10-CM | POA: Diagnosis not present

## 2014-04-02 DIAGNOSIS — Z01419 Encounter for gynecological examination (general) (routine) without abnormal findings: Secondary | ICD-10-CM | POA: Diagnosis not present

## 2014-04-02 DIAGNOSIS — Z124 Encounter for screening for malignant neoplasm of cervix: Secondary | ICD-10-CM | POA: Diagnosis not present

## 2014-04-02 DIAGNOSIS — K5900 Constipation, unspecified: Secondary | ICD-10-CM | POA: Diagnosis not present

## 2014-08-30 DIAGNOSIS — K210 Gastro-esophageal reflux disease with esophagitis: Secondary | ICD-10-CM | POA: Diagnosis not present

## 2014-08-30 DIAGNOSIS — R5383 Other fatigue: Secondary | ICD-10-CM | POA: Diagnosis not present

## 2014-08-30 DIAGNOSIS — M797 Fibromyalgia: Secondary | ICD-10-CM | POA: Diagnosis not present

## 2014-08-30 DIAGNOSIS — G4700 Insomnia, unspecified: Secondary | ICD-10-CM | POA: Diagnosis not present

## 2014-12-22 DIAGNOSIS — L820 Inflamed seborrheic keratosis: Secondary | ICD-10-CM | POA: Diagnosis not present

## 2014-12-22 DIAGNOSIS — K130 Diseases of lips: Secondary | ICD-10-CM | POA: Diagnosis not present

## 2015-01-13 ENCOUNTER — Other Ambulatory Visit: Payer: Self-pay

## 2015-01-13 DIAGNOSIS — Z1231 Encounter for screening mammogram for malignant neoplasm of breast: Secondary | ICD-10-CM

## 2015-02-23 DIAGNOSIS — J4520 Mild intermittent asthma, uncomplicated: Secondary | ICD-10-CM | POA: Diagnosis not present

## 2015-02-23 DIAGNOSIS — J310 Chronic rhinitis: Secondary | ICD-10-CM | POA: Diagnosis not present

## 2015-02-23 DIAGNOSIS — H1045 Other chronic allergic conjunctivitis: Secondary | ICD-10-CM | POA: Diagnosis not present

## 2015-02-23 DIAGNOSIS — J019 Acute sinusitis, unspecified: Secondary | ICD-10-CM | POA: Diagnosis not present

## 2015-02-24 ENCOUNTER — Ambulatory Visit
Admission: RE | Admit: 2015-02-24 | Discharge: 2015-02-24 | Disposition: A | Discharge status: Home / Self Care | Payer: Medicare Other | Source: Ambulatory Visit

## 2015-02-24 DIAGNOSIS — Z1231 Encounter for screening mammogram for malignant neoplasm of breast: Secondary | ICD-10-CM

## 2015-03-01 DIAGNOSIS — M7071 Other bursitis of hip, right hip: Secondary | ICD-10-CM | POA: Diagnosis not present

## 2015-03-01 DIAGNOSIS — G4709 Other insomnia: Secondary | ICD-10-CM | POA: Diagnosis not present

## 2015-03-01 DIAGNOSIS — R5381 Other malaise: Secondary | ICD-10-CM | POA: Diagnosis not present

## 2015-03-16 DIAGNOSIS — E7800 Pure hypercholesterolemia, unspecified: Secondary | ICD-10-CM | POA: Diagnosis not present

## 2015-03-16 DIAGNOSIS — F319 Bipolar disorder, unspecified: Secondary | ICD-10-CM | POA: Diagnosis not present

## 2015-03-16 DIAGNOSIS — J4520 Mild intermittent asthma, uncomplicated: Secondary | ICD-10-CM | POA: Diagnosis not present

## 2015-03-16 DIAGNOSIS — R1110 Vomiting, unspecified: Secondary | ICD-10-CM | POA: Diagnosis not present

## 2015-03-16 DIAGNOSIS — Z0000 Encounter for general adult medical examination without abnormal findings: Secondary | ICD-10-CM | POA: Diagnosis not present

## 2015-03-16 DIAGNOSIS — G43909 Migraine, unspecified, not intractable, without status migrainosus: Secondary | ICD-10-CM | POA: Diagnosis not present

## 2015-03-16 DIAGNOSIS — M797 Fibromyalgia: Secondary | ICD-10-CM | POA: Diagnosis not present

## 2015-04-07 DIAGNOSIS — Z6822 Body mass index (BMI) 22.0-22.9, adult: Secondary | ICD-10-CM | POA: Diagnosis not present

## 2015-04-07 DIAGNOSIS — R875 Abnormal microbiological findings in specimens from female genital organs: Secondary | ICD-10-CM | POA: Diagnosis not present

## 2015-04-07 DIAGNOSIS — Z01419 Encounter for gynecological examination (general) (routine) without abnormal findings: Secondary | ICD-10-CM | POA: Diagnosis not present

## 2015-04-07 DIAGNOSIS — D069 Carcinoma in situ of cervix, unspecified: Secondary | ICD-10-CM | POA: Diagnosis not present

## 2015-04-07 DIAGNOSIS — R87610 Atypical squamous cells of undetermined significance on cytologic smear of cervix (ASC-US): Secondary | ICD-10-CM | POA: Diagnosis not present

## 2015-05-31 DIAGNOSIS — N289 Disorder of kidney and ureter, unspecified: Secondary | ICD-10-CM | POA: Diagnosis not present

## 2015-08-29 DIAGNOSIS — M7071 Other bursitis of hip, right hip: Secondary | ICD-10-CM | POA: Diagnosis not present

## 2015-08-29 DIAGNOSIS — M25562 Pain in left knee: Secondary | ICD-10-CM | POA: Diagnosis not present

## 2015-08-29 DIAGNOSIS — M79642 Pain in left hand: Secondary | ICD-10-CM | POA: Diagnosis not present

## 2015-08-31 DIAGNOSIS — J4520 Mild intermittent asthma, uncomplicated: Secondary | ICD-10-CM | POA: Diagnosis not present

## 2015-08-31 DIAGNOSIS — J310 Chronic rhinitis: Secondary | ICD-10-CM | POA: Diagnosis not present

## 2015-08-31 DIAGNOSIS — H1045 Other chronic allergic conjunctivitis: Secondary | ICD-10-CM | POA: Diagnosis not present

## 2015-09-25 DIAGNOSIS — M545 Low back pain: Secondary | ICD-10-CM | POA: Diagnosis not present

## 2016-01-31 ENCOUNTER — Other Ambulatory Visit: Payer: Self-pay | Admitting: Family Medicine

## 2016-01-31 DIAGNOSIS — Z1231 Encounter for screening mammogram for malignant neoplasm of breast: Secondary | ICD-10-CM

## 2016-02-28 ENCOUNTER — Encounter: Payer: Self-pay | Admitting: *Deleted

## 2016-02-28 DIAGNOSIS — G5622 Lesion of ulnar nerve, left upper limb: Secondary | ICD-10-CM

## 2016-02-28 DIAGNOSIS — K219 Gastro-esophageal reflux disease without esophagitis: Secondary | ICD-10-CM

## 2016-02-28 DIAGNOSIS — G43909 Migraine, unspecified, not intractable, without status migrainosus: Secondary | ICD-10-CM

## 2016-02-28 DIAGNOSIS — G2581 Restless legs syndrome: Secondary | ICD-10-CM

## 2016-02-28 DIAGNOSIS — F319 Bipolar disorder, unspecified: Secondary | ICD-10-CM

## 2016-02-28 DIAGNOSIS — K589 Irritable bowel syndrome without diarrhea: Secondary | ICD-10-CM

## 2016-02-28 DIAGNOSIS — E559 Vitamin D deficiency, unspecified: Secondary | ICD-10-CM

## 2016-02-28 DIAGNOSIS — M797 Fibromyalgia: Secondary | ICD-10-CM

## 2016-02-28 HISTORY — DX: Fibromyalgia: M79.7

## 2016-02-28 HISTORY — DX: Restless legs syndrome: G25.81

## 2016-02-28 HISTORY — DX: Vitamin D deficiency, unspecified: E55.9

## 2016-02-28 HISTORY — DX: Irritable bowel syndrome, unspecified: K58.9

## 2016-02-28 HISTORY — DX: Lesion of ulnar nerve, left upper limb: G56.22

## 2016-02-28 HISTORY — DX: Bipolar disorder, unspecified: F31.9

## 2016-02-28 HISTORY — DX: Migraine, unspecified, not intractable, without status migrainosus: G43.909

## 2016-02-28 HISTORY — DX: Gastro-esophageal reflux disease without esophagitis: K21.9

## 2016-02-28 NOTE — Progress Notes (Signed)
*IMAGE* Office Visit Note  Patient: Felicia Rivera             Date of Birth: May 28, 1970           MRN: [id number]             PCP: Lynne Logan, MD Referring: No ref. provider found Visit Date: 02/29/2016 Occupation:@GUAROCC @    Subjective:  Follow-up Follow-up on fibromyalgia.  History of Present Illness: Felicia Rivera is a 45 y.o. female last seen 08/29/2015 for fibromyalgia. Patient at 23 out of 18 tender points. She was also complaining of bilateral greater trochanter bursa pain. She ended up using Voltaren gel. She has ongoing left hip joint pain at that visit that was radiating to the groin. She did range of motion exercises including dancing which helped her groin pain. She was also complaining of left thumb pain but we did an x-ray 3 views of the left thumb and it proved to be negative. There is no fracture.   Today patient states having some right shoulder joint pain to the right anterior shoulder. It started last night. Decreased range of motion secondary to pain. Currently she rates her discomfort as 7 on a scale of 0-10.  Also complaining of right wrist pain that radiates down to her right thumb. She did get a brace but that's a cock-up splint and that is not giving her good relief yet. The pain comes and goes. It's been happening off and on over 2 weeks. She cannot really recall the mechanism of injury. 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  Patient is also complaining of left hip pain. It radiates from the left lateral back down to her left SI joint and the left greater trochanteric bursa area.    Activities of Daily Living:  Patient reports morning stiffness for 15 minutes.   Patient Reports nocturnal pain.  Difficulty dressing/grooming: Denies Difficulty climbing stairs: Reports Difficulty getting out of chair: Reports Difficulty using hands for taps,  buttons, cutlery, and/or writing: Reports   Review of Systems  Constitutional: Positive for fatigue.  HENT: Negative for mouth sores and mouth dryness.   Eyes: Negative for dryness.  Respiratory: Negative for shortness of breath.   Gastrointestinal: Negative for constipation and diarrhea.  Musculoskeletal: Positive for myalgias and myalgias.  Skin: Negative for sensitivity to sunlight.  Psychiatric/Behavioral: Positive for sleep disturbance. Negative for decreased concentration.    PMFS History:  Patient Active Problem List   Diagnosis Date Noted  . Fibromyalgia 02/28/2016  . Ulnar neuropathy at elbow, left 02/28/2016  . Bipolar disorder ([place]) 02/28/2016  . Vitamin D deficiency 02/28/2016  . Migraine 02/28/2016  . RLS (restless legs syndrome) 02/28/2016  . IBS (irritable bowel syndrome) 02/28/2016  . GERD (gastroesophageal reflux disease) 02/28/2016    Past Medical History:  Diagnosis Date  . Bipolar 1 disorder ([place]) 02/28/2016  . Bipolar disorder ([place]) 02/28/2016  . Fibromyalgia 02/28/2016  . GERD (gastroesophageal reflux disease) 02/28/2016  . IBS (irritable bowel syndrome) 02/28/2016  . Migraine 02/28/2016  . RLS (restless legs syndrome) 02/28/2016  . Ulnar neuropathy at elbow, left 02/28/2016  . Vitamin D deficiency 02/28/2016    History reviewed. No pertinent family history. Past Surgical History:  Procedure Laterality Date  . ABDOMINAL HYSTERECTOMY     Social History   Social History Narrative  . No narrative on file  Objective: Vital Signs: BP 104/78 (BP Location: Right Arm, Patient Position: Sitting, Cuff Size: Large)   Pulse 95   Resp 13   Ht 5\' 4"  (1.626 m)   Wt 123 lb (55.8 kg)   BMI 21.11 kg/m    Physical Exam  Constitutional: She is oriented to person, place, and time. She appears well-developed and well-nourished.  HENT:  Head: Normocephalic and atraumatic.  Eyes: EOM are normal. Pupils are equal, round, and reactive to light.   Cardiovascular: Normal rate, regular rhythm and normal heart sounds.  Exam reveals no gallop and no friction rub.   No murmur heard. Pulmonary/Chest: Effort normal and breath sounds normal. She has no wheezes. She has no rales.  Abdominal: Soft. Bowel sounds are normal. She exhibits no distension. There is no tenderness. There is no guarding. No hernia.  Musculoskeletal: Normal range of motion. She exhibits no edema, tenderness or deformity.  Lymphadenopathy:    She has no cervical adenopathy.  Neurological: She is alert and oriented to person, place, and time. Coordination normal.  Skin: Skin is warm and dry. Capillary refill takes less than 2 seconds. No rash noted.  Psychiatric: She has a normal mood and affect. Her behavior is normal.  Nursing note and vitals reviewed.    Musculoskeletal Exam:  Full range of motion of all joints  grips strength is equal and strong bilaterally Fibromyalgia tender points are 18 out of 18 positive.  CDAI Exam: No CDAI exam completed.    Investigation: No additional findings.   Imaging: No results found.  Speciality Comments: No specialty comments available.    Procedures:  No procedures performed Allergies: Cogentin [benztropine]; Hydrocodone; Lyrica [pregabalin]; and Omnicef [cefdinir]   Assessment / Plan: Visit Diagnoses: Fibromyalgia  Fatigue, unspecified type  Insomnia, unspecified type  Bilateral sacroiliitis (HCC)  Trochanteric bursitis of both hips   Patient rates her fibromyalgia pain is 7 on a scale of 0-10 and fatigue is rated 9 on a scale of 10.  She has trochanteric pain bilaterally sacroiliitis bilaterally. I offered her cortisone shot but she declines. She wants to use Voltaren gel which is fine.  She is not using a muscle relaxer and I encouraged her to use her muscle relaxer. She does not have it on the med list because she cannot recall the name of the muscle relaxer. She thinks it might be  methocarbamol.  With the use of Voltaren gel and methocarbamol she may receive all the benefit that she needs.  We also discussed that her knees occasionally hurt and that could be contributing to her hip pain and back pain lumbar spine pain ankle and feet pain. She may benefit from Visco supplementation in the future. She has to qualify for the Visco supplementation we discussed conservative treatments to use. She does not need to lose weight but she does need to have updated x-rays of her knees, cortisone injection in one knee at one time, feel NSAIDS.  Return to clinic in 5 months as scheduled    Orders: No orders of the defined types were placed in this encounter.  Meds ordered this encounter  Medications  . diclofenac sodium (VOLTAREN) 1 % GEL    Sig: Voltaren Gel 3 grams to 3 large joints upto TID 3 TUBES with 3 refills    Dispense:  3 Tube    Refill:  3    Voltaren Gel 3 grams to 3 large joints upto TID 3 TUBES with 3 refills  . ARNUITY ELLIPTA 100 MCG/ACT  AEPB    Face-to-face time spent with patient was 30 minutes. 50% of time was spent in counseling and coordination of care.  Follow-Up Instructions: Return in about 5 months (around 07/29/2016) for Fibromyalga, B. SI JT pain, B. gr tr bursa pain, B trap muscle pain.

## 2016-02-29 ENCOUNTER — Encounter: Payer: Self-pay | Admitting: Rheumatology

## 2016-02-29 ENCOUNTER — Ambulatory Visit (INDEPENDENT_AMBULATORY_CARE_PROVIDER_SITE_OTHER): Payer: Medicare Other | Admitting: Rheumatology

## 2016-02-29 VITALS — BP 104/78 | HR 95 | Resp 13 | Ht 64.0 in | Wt 123.0 lb

## 2016-02-29 DIAGNOSIS — M461 Sacroiliitis, not elsewhere classified: Secondary | ICD-10-CM | POA: Diagnosis not present

## 2016-02-29 DIAGNOSIS — G4700 Insomnia, unspecified: Secondary | ICD-10-CM

## 2016-02-29 DIAGNOSIS — M797 Fibromyalgia: Secondary | ICD-10-CM | POA: Diagnosis not present

## 2016-02-29 DIAGNOSIS — M7062 Trochanteric bursitis, left hip: Secondary | ICD-10-CM

## 2016-02-29 DIAGNOSIS — R5383 Other fatigue: Secondary | ICD-10-CM

## 2016-02-29 DIAGNOSIS — M7061 Trochanteric bursitis, right hip: Secondary | ICD-10-CM

## 2016-02-29 MED ORDER — DICLOFENAC SODIUM 1 % TD GEL: TRANSDERMAL | 3 refills | Status: DC

## 2016-03-06 DIAGNOSIS — J4520 Mild intermittent asthma, uncomplicated: Secondary | ICD-10-CM | POA: Diagnosis not present

## 2016-03-06 DIAGNOSIS — J310 Chronic rhinitis: Secondary | ICD-10-CM | POA: Diagnosis not present

## 2016-03-06 DIAGNOSIS — H1045 Other chronic allergic conjunctivitis: Secondary | ICD-10-CM | POA: Diagnosis not present

## 2016-03-07 ENCOUNTER — Ambulatory Visit
Admission: RE | Admit: 2016-03-07 | Discharge: 2016-03-07 | Disposition: A | Discharge status: Home / Self Care | Payer: Medicare Other | Source: Ambulatory Visit | Attending: Family Medicine | Admitting: Family Medicine

## 2016-03-07 DIAGNOSIS — Z1231 Encounter for screening mammogram for malignant neoplasm of breast: Secondary | ICD-10-CM

## 2016-05-07 DIAGNOSIS — Z01419 Encounter for gynecological examination (general) (routine) without abnormal findings: Secondary | ICD-10-CM | POA: Diagnosis not present

## 2016-05-07 DIAGNOSIS — D069 Carcinoma in situ of cervix, unspecified: Secondary | ICD-10-CM | POA: Diagnosis not present

## 2016-05-07 DIAGNOSIS — Z130 Encounter for screening for diseases of the blood and blood-forming organs and certain disorders involving the immune mechanism: Secondary | ICD-10-CM | POA: Diagnosis not present

## 2016-05-07 DIAGNOSIS — Z6822 Body mass index (BMI) 22.0-22.9, adult: Secondary | ICD-10-CM | POA: Diagnosis not present

## 2016-05-07 DIAGNOSIS — R875 Abnormal microbiological findings in specimens from female genital organs: Secondary | ICD-10-CM | POA: Diagnosis not present

## 2016-07-25 DIAGNOSIS — M461 Sacroiliitis, not elsewhere classified: Secondary | ICD-10-CM | POA: Insufficient documentation

## 2016-07-25 DIAGNOSIS — M7062 Trochanteric bursitis, left hip: Secondary | ICD-10-CM | POA: Insufficient documentation

## 2016-07-25 DIAGNOSIS — G4709 Other insomnia: Secondary | ICD-10-CM | POA: Insufficient documentation

## 2016-07-25 DIAGNOSIS — M7061 Trochanteric bursitis, right hip: Secondary | ICD-10-CM | POA: Insufficient documentation

## 2016-07-25 DIAGNOSIS — R5383 Other fatigue: Secondary | ICD-10-CM | POA: Insufficient documentation

## 2016-07-25 NOTE — Progress Notes (Signed)
Office Visit Note  Patient: Felicia Rivera             Date of Birth: May 27, 1970           MRN: [id number]             PCP: Lynne Logan, MD Referring: Donald Prose, MD Visit Date: 07/31/2016 Occupation: @GUAROCC @    Subjective:  Follow-up   History of Present Illness: Felicia Rivera is a 46 y.o. female  Follow up on fibromyalgia. Has been flaring with her fibromyalgia. 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 This extra activity has allowed her fibromyalgia flare. Her pain is rated 8 on a scale of 0-10.  She thought she was on a muscle relaxer but it turns out that she is not on any. We discussed the importance of using the right medication for the right reason and she is agreeable to try muscle relaxer   Patient is also having ongoing fatigue. She states that her mother and friends feel like she might need to have her hormones checked. She has not discussed this with her doctor yet and she plans to do so soon.  For her fatigue, I offered to check her vitamin D levels and thyroid panel and she is agreeable. She also needs updated CBC with differential and CMP with GFR which we'll get today.  Activities of Daily Living:  Patient reports morning stiffness for 60 minutes.   Patient Reports nocturnal pain.  Difficulty dressing/grooming: Reports Difficulty climbing stairs: Reports Difficulty getting out of chair: Reports Difficulty using hands for taps, buttons, cutlery, and/or writing: Reports   Review of Systems  Constitutional: Positive for fatigue.  HENT: Negative for mouth sores and mouth dryness.   Eyes: Negative for dryness.  Respiratory: Negative for shortness of breath.   Gastrointestinal: Negative for constipation and diarrhea.  Musculoskeletal: Positive for myalgias and myalgias.  Skin: Negative for sensitivity to sunlight.  Psychiatric/Behavioral: Positive for sleep disturbance. Negative for decreased concentration.    PMFS  History:  Patient Active Problem List   Diagnosis Date Noted  . Other fatigue 07/25/2016  . Other insomnia 07/25/2016  . Bilateral sacroiliitis ([place]) 07/25/2016  . Trochanteric bursitis of both hips 07/25/2016  . Fibromyalgia 02/28/2016  . Ulnar neuropathy at elbow, left 02/28/2016  . Bipolar disorder ([place]) 02/28/2016  . Vitamin D deficiency 02/28/2016  . Migraine 02/28/2016  . RLS (restless legs syndrome) 02/28/2016  . IBS (irritable bowel syndrome) 02/28/2016  . GERD (gastroesophageal reflux disease) 02/28/2016    Past Medical History:  Diagnosis Date  . Bipolar 1 disorder ([place]) 02/28/2016  . Bipolar disorder ([place]) 02/28/2016  . Fibromyalgia 02/28/2016  . GERD (gastroesophageal reflux disease) 02/28/2016  . IBS (irritable bowel syndrome) 02/28/2016  . Migraine 02/28/2016  . RLS (restless legs syndrome) 02/28/2016  . Ulnar neuropathy at elbow, left 02/28/2016  . Vitamin D deficiency 02/28/2016    History reviewed. No pertinent family history. Past Surgical History:  Procedure Laterality Date  . ABDOMINAL HYSTERECTOMY     Social History   Social History Narrative  . No narrative on file     Objective: Vital Signs: BP 108/70   Pulse 79   Resp 13   Ht 5' 4"  (1.626 m)   Wt 115 lb (52.2 kg)   BMI 19.74 kg/m    Physical Exam  Constitutional: She is oriented to person, place, and time. She appears well-developed and well-nourished.  HENT:  Head: Normocephalic and atraumatic.  Eyes: EOM are normal. Pupils are equal, round, and reactive to light.  Cardiovascular: Normal rate, regular rhythm and normal heart sounds.  Exam reveals no gallop and no friction rub.   No murmur heard. Pulmonary/Chest: Effort normal and breath sounds normal. She has no wheezes. She has no rales.  Abdominal: Soft. Bowel sounds are normal. She exhibits no distension. There is no tenderness. There is no guarding. No hernia.  Musculoskeletal: Normal range of motion. She exhibits no edema,  tenderness or deformity.  Lymphadenopathy:    She has no cervical adenopathy.  Neurological: She is alert and oriented to person, place, and time. Coordination normal.  Skin: Skin is warm and dry. Capillary refill takes less than 2 seconds. No rash noted.  Psychiatric: She has a normal mood and affect. Her behavior is normal.  Nursing note and vitals reviewed.    Musculoskeletal Exam: FROM of all joints Grip strength & equal strong bilaterally FMS active w/ generalized pain 8 of 0 - 10 pain   CDAI Exam: CDAI Homunculus Exam:   Joint Counts:  CDAI Tender Joint count: 0 CDAI Swollen Joint count: 0  Global Assessments:  Patient Global Assessment: 8 Provider Global Assessment: 8  CDAI Calculated Score: 16    Investigation: No additional findings. No visits with results within 6 Month(s) from this visit.  Latest known visit with results is:  Admission on 05/27/2012, Discharged on 05/27/2012  Component Date Value Ref Range Status  . Color, Urine 05/27/2012 YELLOW  YELLOW Final  . APPearance 05/27/2012 CLOUDY* CLEAR Final  . Specific Gravity, Urine 05/27/2012 1.011  1.005 - 1.030 Final  . pH 05/27/2012 6.5  5.0 - 8.0 Final  . Glucose, UA 05/27/2012 NEGATIVE  NEGATIVE mg/dL Final  . Hgb urine dipstick 05/27/2012 NEGATIVE  NEGATIVE Final  . Bilirubin Urine 05/27/2012 NEGATIVE  NEGATIVE Final  . Ketones, ur 05/27/2012 NEGATIVE  NEGATIVE mg/dL Final  . Protein, ur 05/27/2012 NEGATIVE  NEGATIVE mg/dL Final  . Urobilinogen, UA 05/27/2012 0.2  0.0 - 1.0 mg/dL Final  . Nitrite 05/27/2012 NEGATIVE  NEGATIVE Final  . Leukocytes, UA 05/27/2012 NEGATIVE  NEGATIVE Final  . WBC 05/27/2012 6.4  4.0 - 10.5 K/uL Final  . RBC 05/27/2012 4.17  3.87 - 5.11 MIL/uL Final  . Hemoglobin 05/27/2012 12.6  12.0 - 15.0 g/dL Final  . HCT 05/27/2012 37.7  36.0 - 46.0 % Final  . MCV 05/27/2012 90.4  78.0 - 100.0 fL Final  . MCH 05/27/2012 30.2  26.0 - 34.0 pg Final  . MCHC 05/27/2012 33.4  30.0 -  36.0 g/dL Final  . RDW 05/27/2012 12.5  11.5 - 15.5 % Final  . Platelets 05/27/2012 181  150 - 400 K/uL Final  . Sodium 05/27/2012 138  135 - 145 mEq/L Final  . Potassium 05/27/2012 3.8  3.5 - 5.1 mEq/L Final  . Chloride 05/27/2012 104  96 - 112 mEq/L Final  . CO2 05/27/2012 24  19 - 32 mEq/L Final  . Glucose, Bld 05/27/2012 95  70 - 99 mg/dL Final  . BUN 05/27/2012 8  6 - 23 mg/dL Final  . Creatinine, Ser 05/27/2012 1.34* 0.50 - 1.10 mg/dL Final  . Calcium 05/27/2012 9.2  8.4 - 10.5 mg/dL Final  . Total Protein 05/27/2012 7.1  6.0 - 8.3 g/dL Final  . Albumin 05/27/2012 3.9  3.5 - 5.2 g/dL Final  . AST 05/27/2012 15  0 - 37 U/L Final  . ALT 05/27/2012 5  0 - 35 U/L Final  .  Alkaline Phosphatase 05/27/2012 58  39 - 117 U/L Final  . Total Bilirubin 05/27/2012 0.3  0.3 - 1.2 mg/dL Final  . GFR calc non Af Amer 05/27/2012 48* >90 mL/min Final  . GFR calc Af Amer 05/27/2012 56* >90 mL/min Final   Comment:                                 The eGFR has been calculated                          using the CKD EPI equation.                          This calculation has not been                          validated in all clinical                          situations.                          eGFR's persistently                          <90 mL/min signify                          possible Chronic Kidney Disease.     Imaging: No results found.  Speciality Comments: No specialty comments available.    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  Other fatigue - Plan: CBC with Differential/Platelet, COMPLETE METABOLIC PANEL WITH GFR, VITAMIN D 25 Hydroxy (Vit-D Deficiency, Fractures), Thyroid Panel With TSH  Other insomnia  Bilateral sacroiliitis  (HCC)  Trochanteric bursitis of both hips   Plan: #1: Fibromyalgia. Has an exacerbation lately due to extra activity (preparing for a ballroom dancing gala event and patient has tremendous amount of work for that.).  #2: Fatigue and insomnia. I'll order vitamin D and thyroid panel to see if the fatigue might be coming from any of those common issues She'll need a CBC with differential and CMP with GFR today.  #3: New prescription: Baclofen 10 mg 1 by mouth up to 3 times a day when necessary muscle spasms. Dispense 30 days supply with 5 refills  #4: Return to clinic in 5 months   Orders: Orders Placed This Encounter  Procedures  . CBC with Differential/Platelet  . COMPLETE METABOLIC PANEL WITH GFR  . VITAMIN D 25 Hydroxy (Vit-D Deficiency, Fractures)  . Thyroid Panel With TSH   Meds ordered this encounter  Medications  . baclofen (LIORESAL) 10 MG tablet    Sig: Take 1 tablet (10 mg total) by mouth 3 (three) times daily.    Dispense:  90 tablet    Refill:  5    Order Specific Question:   Supervising Provider    Answer:   Bo Merino [phone]    Face-to-face time spent with patient was 30 minutes. 50% of time was spent in counseling  and coordination of care.  Follow-Up Instructions: Return in about 5 months (around 12/31/2016) for [place], [place].   Eliezer Lofts, PA-C  Note - This record has been created using [employer].  Chart creation errors have been sought, but may not always  have been located. Such creation errors do not reflect on  the standard of medical care.

## 2016-07-31 ENCOUNTER — Ambulatory Visit (INDEPENDENT_AMBULATORY_CARE_PROVIDER_SITE_OTHER): Payer: Medicare Other | Admitting: Rheumatology

## 2016-07-31 ENCOUNTER — Encounter: Payer: Self-pay | Admitting: Rheumatology

## 2016-07-31 ENCOUNTER — Ambulatory Visit: Payer: Medicare Other | Admitting: Rheumatology

## 2016-07-31 VITALS — BP 108/70 | HR 79 | Resp 13 | Ht 64.0 in | Wt 115.0 lb

## 2016-07-31 DIAGNOSIS — R5383 Other fatigue: Secondary | ICD-10-CM

## 2016-07-31 DIAGNOSIS — M797 Fibromyalgia: Secondary | ICD-10-CM

## 2016-07-31 DIAGNOSIS — M7062 Trochanteric bursitis, left hip: Secondary | ICD-10-CM | POA: Diagnosis not present

## 2016-07-31 DIAGNOSIS — M7061 Trochanteric bursitis, right hip: Secondary | ICD-10-CM

## 2016-07-31 DIAGNOSIS — G4709 Other insomnia: Secondary | ICD-10-CM

## 2016-07-31 DIAGNOSIS — M461 Sacroiliitis, not elsewhere classified: Secondary | ICD-10-CM | POA: Diagnosis not present

## 2016-07-31 LAB — CBC WITH DIFFERENTIAL/PLATELET
Basophils Absolute: 65 cells/uL (ref 0–200)
Basophils Relative: 1 %
EOS ABS: 195 {cells}/uL (ref 15–500)
Eosinophils Relative: 3 %
HEMATOCRIT: 40.3 % (ref 35.0–45.0)
HEMOGLOBIN: 13.2 g/dL (ref 11.7–15.5)
LYMPHS ABS: 1625 {cells}/uL (ref 850–3900)
LYMPHS PCT: 25 %
MCH: 30.8 pg (ref 27.0–33.0)
MCHC: 32.8 g/dL (ref 32.0–36.0)
MCV: 94.2 fL (ref 80.0–100.0)
MONO ABS: 455 {cells}/uL (ref 200–950)
MPV: 11.3 fL (ref 7.5–12.5)
Monocytes Relative: 7 %
Neutro Abs: 4160 cells/uL (ref 1500–7800)
Neutrophils Relative %: 64 %
Platelets: 226 10*3/uL (ref 140–400)
RBC: 4.28 MIL/uL (ref 3.80–5.10)
RDW: 13.7 % (ref 11.0–15.0)
WBC: 6.5 10*3/uL (ref 3.8–10.8)

## 2016-07-31 LAB — COMPLETE METABOLIC PANEL WITH GFR
ALT: 10 U/L (ref 6–29)
AST: 19 U/L (ref 10–35)
Albumin: 4.3 g/dL (ref 3.6–5.1)
Alkaline Phosphatase: 47 U/L (ref 33–115)
BUN: 9 mg/dL (ref 7–25)
CALCIUM: 10.2 mg/dL (ref 8.6–10.2)
CHLORIDE: 110 mmol/L (ref 98–110)
CO2: 19 mmol/L — AB (ref 20–31)
CREATININE: 1.42 mg/dL — AB (ref 0.50–1.10)
GFR, Est African American: 51 mL/min — ABNORMAL LOW (ref >=60)
GFR, Est Non African American: 45 mL/min — ABNORMAL LOW (ref >=60)
GLUCOSE: 105 mg/dL — AB (ref 65–99)
POTASSIUM: 5.4 mmol/L — AB (ref 3.5–5.3)
Sodium: 142 mmol/L (ref 135–146)
Total Bilirubin: 0.4 mg/dL (ref 0.2–1.2)
Total Protein: 7.2 g/dL (ref 6.1–8.1)

## 2016-07-31 MED ORDER — BACLOFEN 10 MG PO TABS: 10.0000 mg | ORAL_TABLET | Freq: Three times a day (TID) | ORAL | 5 refills | Status: DC

## 2016-08-01 LAB — THYROID PANEL WITH TSH
FREE THYROXINE INDEX: 1.5 (ref 1.4–3.8)
T3 Uptake: 31 % (ref 22–35)
T4, Total: 4.7 ug/dL (ref 4.5–12.0)
TSH: 2.2 m[IU]/L

## 2016-08-01 LAB — VITAMIN D 25 HYDROXY (VIT D DEFICIENCY, FRACTURES): Vit D, 25-Hydroxy: 34 ng/mL (ref 30–100)

## 2016-08-04 ENCOUNTER — Other Ambulatory Visit: Payer: Self-pay | Admitting: Rheumatology

## 2016-08-06 NOTE — Telephone Encounter (Addendum)
Last Visit: 07/31/16 Next Visit: 01/02/17  Okay to refill Topiramate?

## 2016-08-08 DIAGNOSIS — F319 Bipolar disorder, unspecified: Secondary | ICD-10-CM | POA: Diagnosis not present

## 2016-08-08 DIAGNOSIS — J4520 Mild intermittent asthma, uncomplicated: Secondary | ICD-10-CM | POA: Diagnosis not present

## 2016-08-08 DIAGNOSIS — Z23 Encounter for immunization: Secondary | ICD-10-CM | POA: Diagnosis not present

## 2016-08-08 DIAGNOSIS — F411 Generalized anxiety disorder: Secondary | ICD-10-CM | POA: Diagnosis not present

## 2016-08-08 DIAGNOSIS — N951 Menopausal and female climacteric states: Secondary | ICD-10-CM | POA: Diagnosis not present

## 2016-08-08 DIAGNOSIS — Z1389 Encounter for screening for other disorder: Secondary | ICD-10-CM | POA: Diagnosis not present

## 2016-12-19 ENCOUNTER — Encounter: Payer: Self-pay | Admitting: Rheumatology

## 2016-12-19 ENCOUNTER — Ambulatory Visit (INDEPENDENT_AMBULATORY_CARE_PROVIDER_SITE_OTHER): Payer: Medicare Other | Admitting: Rheumatology

## 2016-12-19 VITALS — BP 104/60 | HR 70 | Resp 14 | Ht 64.0 in | Wt 120.0 lb

## 2016-12-19 DIAGNOSIS — M19042 Primary osteoarthritis, left hand: Secondary | ICD-10-CM

## 2016-12-19 DIAGNOSIS — R5383 Other fatigue: Secondary | ICD-10-CM

## 2016-12-19 DIAGNOSIS — M19041 Primary osteoarthritis, right hand: Secondary | ICD-10-CM

## 2016-12-19 DIAGNOSIS — G4709 Other insomnia: Secondary | ICD-10-CM | POA: Diagnosis not present

## 2016-12-19 DIAGNOSIS — M797 Fibromyalgia: Secondary | ICD-10-CM

## 2016-12-19 MED ORDER — DICLOFENAC SODIUM 1 % TD GEL: TRANSDERMAL | 3 refills | Status: DC

## 2016-12-19 NOTE — Progress Notes (Signed)
Office Visit Note  Patient: Felicia Rivera   Y she uses          Date of Birth: 1971/01/29           MRN: [id number]             PCP: Donald Prose, MD Referring: Donald Prose, MD Visit Date: 12/19/2016 Occupation: @GUAROCC @    Subjective:  Medication Management   History of Present Illness: Felicia Rivera is a 46 y.o. female  Was last seen in our office on 07/31/2016 for fibromyalgia.  Patient states that she is doing about the same with her fibromyalgia. She rates her discomfort a little less than 8 on this visit versus the last visit. She does not need any injections at this time for her trigger points. She is alert how to manage her fibromyalgia quite well. She does need a refill on Voltaren gel. She states that that really helps her manage her discomfort well. She uses it as prescribed on a when necessary basis  She does well with her sleep as well as her fatigue. She paces her activities to not overdo it under do it and thereby managing her fibromyalgia better.  Activities of Daily Living:  Patient reports morning stiffness for 15 minutes.   Patient Reports nocturnal pain.  Difficulty dressing/grooming: Denies Difficulty climbing stairs: Denies Difficulty getting out of chair: Denies Difficulty using hands for taps, buttons, cutlery, and/or writing: Reports   Review of Systems  Constitutional: Positive for fatigue.  HENT: Negative for mouth sores and mouth dryness.   Eyes: Negative for dryness.  Respiratory: Negative for shortness of breath.   Gastrointestinal: Negative for constipation and diarrhea.  Musculoskeletal: Positive for myalgias and myalgias.  Skin: Negative for sensitivity to sunlight.  Psychiatric/Behavioral: Positive for sleep disturbance. Negative for decreased concentration.    PMFS History:  Patient Active Problem List   Diagnosis Date Noted  . Other fatigue 07/25/2016  . Other insomnia 07/25/2016  . Bilateral sacroiliitis ([place]) 07/25/2016  .  Trochanteric bursitis of both hips 07/25/2016  . Fibromyalgia 02/28/2016  . Ulnar neuropathy at elbow, left 02/28/2016  . Bipolar disorder ([place]) 02/28/2016  . Vitamin D deficiency 02/28/2016  . Migraine 02/28/2016  . RLS (restless legs syndrome) 02/28/2016  . IBS (irritable bowel syndrome) 02/28/2016  . GERD (gastroesophageal reflux disease) 02/28/2016    Past Medical History:  Diagnosis Date  . Bipolar 1 disorder ([place]) 02/28/2016  . Bipolar disorder ([place]) 02/28/2016  . Fibromyalgia 02/28/2016  . GERD (gastroesophageal reflux disease) 02/28/2016  . IBS (irritable bowel syndrome) 02/28/2016  . Migraine 02/28/2016  . RLS (restless legs syndrome) 02/28/2016  . Ulnar neuropathy at elbow, left 02/28/2016  . Vitamin D deficiency 02/28/2016    No family history on file. Past Surgical History:  Procedure Laterality Date  . ABDOMINAL HYSTERECTOMY     Social History   Social History Narrative  . No narrative on file     Objective: Vital Signs: BP 104/60   Pulse 70   Resp 14   Ht 5' 4"  (1.626 m)   Wt 120 lb (54.4 kg)   BMI 20.60 kg/m    Physical Exam  Constitutional: She is oriented to person, place, and time. She appears well-developed and well-nourished.  HENT:  Head: Normocephalic and atraumatic.  Eyes: Pupils are equal, round, and reactive to light. EOM are normal.  Cardiovascular: Normal rate, regular rhythm and normal heart sounds.  Exam reveals no gallop and no friction rub.  No murmur heard. Pulmonary/Chest: Effort normal and breath sounds normal. She has no wheezes. She has no rales.  Abdominal: Soft. Bowel sounds are normal. She exhibits no distension. There is no tenderness. There is no guarding. No hernia.  Musculoskeletal: Normal range of motion. She exhibits no edema, tenderness or deformity.  Lymphadenopathy:    She has no cervical adenopathy.  Neurological: She is alert and oriented to person, place, and time. Coordination normal.  Skin: Skin is warm and  dry. Capillary refill takes less than 2 seconds. No rash noted.  Psychiatric: She has a normal mood and affect. Her behavior is normal.  Nursing note and vitals reviewed.    Musculoskeletal Exam:  Full range of motion of all joints Grip strength is equal and strong bilaterally Fiber myalgia tender points are 4 out of 18 positive  CDAI Exam: CDAI Homunculus Exam:   Joint Counts:  CDAI Tender Joint count: 0 CDAI Swollen Joint count: 0  Global Assessments:  Patient Global Assessment: 7 Provider Global Assessment: 7  CDAI Calculated Score: 14    Investigation: No additional findings. Office Visit on 07/31/2016  Component Date Value Ref Range Status  . WBC 07/31/2016 6.5  3.8 - 10.8 K/uL Final  . RBC 07/31/2016 4.28  3.80 - 5.10 MIL/uL Final  . Hemoglobin 07/31/2016 13.2  11.7 - 15.5 g/dL Final  . HCT 07/31/2016 40.3  35.0 - 45.0 % Final  . MCV 07/31/2016 94.2  80.0 - 100.0 fL Final  . MCH 07/31/2016 30.8  27.0 - 33.0 pg Final  . MCHC 07/31/2016 32.8  32.0 - 36.0 g/dL Final  . RDW 07/31/2016 13.7  11.0 - 15.0 % Final  . Platelets 07/31/2016 226  140 - 400 K/uL Final  . MPV 07/31/2016 11.3  7.5 - 12.5 fL Final  . Neutro Abs 07/31/2016 4160  1,500 - 7,800 cells/uL Final  . Lymphs Abs 07/31/2016 1625  850 - 3,900 cells/uL Final  . Monocytes Absolute 07/31/2016 455  200 - 950 cells/uL Final  . Eosinophils Absolute 07/31/2016 195  15 - 500 cells/uL Final  . Basophils Absolute 07/31/2016 65  0 - 200 cells/uL Final  . Neutrophils Relative % 07/31/2016 64  % Final  . Lymphocytes Relative 07/31/2016 25  % Final  . Monocytes Relative 07/31/2016 7  % Final  . Eosinophils Relative 07/31/2016 3  % Final  . Basophils Relative 07/31/2016 1  % Final  . Smear Review 07/31/2016 Criteria for review not met   Final  . Sodium 07/31/2016 142  135 - 146 mmol/L Final  . Potassium 07/31/2016 5.4* 3.5 - 5.3 mmol/L Final  . Chloride 07/31/2016 110  98 - 110 mmol/L Final  . CO2 07/31/2016 19* 20  - 31 mmol/L Final  . Glucose, Bld 07/31/2016 105* 65 - 99 mg/dL Final  . BUN 07/31/2016 9  7 - 25 mg/dL Final  . Creat 07/31/2016 1.42* 0.50 - 1.10 mg/dL Final  . Total Bilirubin 07/31/2016 0.4  0.2 - 1.2 mg/dL Final  . Alkaline Phosphatase 07/31/2016 47  33 - 115 U/L Final  . AST 07/31/2016 19  10 - 35 U/L Final  . ALT 07/31/2016 10  6 - 29 U/L Final  . Total Protein 07/31/2016 7.2  6.1 - 8.1 g/dL Final  . Albumin 07/31/2016 4.3  3.6 - 5.1 g/dL Final  . Calcium 07/31/2016 10.2  8.6 - 10.2 mg/dL Final  . GFR, Est African American 07/31/2016 51* >=60 mL/min Final  . GFR, Est Non African  American 07/31/2016 45* >=60 mL/min Final  . Vit D, 25-Hydroxy 07/31/2016 34  30 - 100 ng/mL Final   Comment: Vitamin D Status           25-OH Vitamin D        Deficiency                <20 ng/mL        Insufficiency         20 - 29 ng/mL        Optimal             > or = 30 ng/mL   For 25-OH Vitamin D testing on patients on D2-supplementation and patients for whom quantitation of D2 and D3 fractions is required, the QuestAssureD 25-OH VIT D, (D2,D3), LC/MS/MS is recommended: order code [id number] (patients > 2 yrs).   . T4, Total 07/31/2016 4.7  4.5 - 12.0 ug/dL Final  . T3 Uptake 07/31/2016 31  22 - 35 % Final  . Free Thyroxine Index 07/31/2016 1.5  1.4 - 3.8 Final  . TSH 07/31/2016 2.20  mIU/L Final   Comment:   Reference Range   > or = 20 Years  0.40-4.50   Pregnancy Range First trimester  0.26-2.66 Second trimester 0.55-2.73 Third trimester  0.43-2.91        Imaging: No results found.  Speciality Comments: No specialty comments available.    Procedures:  No procedures performed Allergies: Cogentin [benztropine]; Hydrocodone; Lyrica [pregabalin]; and Omnicef [cefdinir]   Assessment / Plan:     Visit Diagnoses: Fibromyalgia  Other fatigue  Other insomnia  Primary osteoarthritis of both hands   Plan: #1: Fibromyalgia. Active disease with generalized pain and 4 out of 18  tender points.  #2: Fatigue and insomnia. Ongoing. Patient managing it well now.  #3: OA of bilateral hands. Occasional pain. Uses Voltaren gel.  #4: Return to clinic in December 2018( per patient's request.)   Orders: No orders of the defined types were placed in this encounter.  Meds ordered this encounter  Medications  . diclofenac sodium (VOLTAREN) 1 % GEL    Sig: Voltaren Gel 3 grams to 3 large joints upto TID 5 TUBES with 3 refills    Dispense:  5 Tube    Refill:  3    Voltaren Gel 3 grams to 3 large joints upto TID 5 TUBES with 3 refills    Order Specific Question:   Supervising Provider    Answer:   Bo Merino [phone]    Face-to-face time spent with patient was 30 minutes. 50% of time was spent in counseling and coordination of care.  Follow-Up Instructions: Return in about 4 months (around 04/20/2017) for [place], [place].   Eliezer Lofts, PA-C  Note - This record has been created using [employer].  Chart creation errors have been sought, but may not always  have been located. Such creation errors do not reflect on  the standard of medical care.

## 2017-01-02 ENCOUNTER — Ambulatory Visit: Payer: Medicare Other | Admitting: Rheumatology

## 2017-01-16 ENCOUNTER — Other Ambulatory Visit: Payer: Self-pay | Admitting: Rheumatology

## 2017-01-16 NOTE — Telephone Encounter (Signed)
Last Visit: 12/19/16 Next Visit: 05/14/17  Okay to refill per Dr. Estanislado Pandy

## 2017-01-27 ENCOUNTER — Other Ambulatory Visit: Payer: Self-pay | Admitting: Rheumatology

## 2017-01-28 NOTE — Telephone Encounter (Signed)
Last Visit: 12/19/16 Next Visit: 05/14/17  Okay to refill per Dr. Estanislado Pandy

## 2017-02-01 DIAGNOSIS — M797 Fibromyalgia: Secondary | ICD-10-CM | POA: Diagnosis not present

## 2017-02-01 DIAGNOSIS — Z23 Encounter for immunization: Secondary | ICD-10-CM | POA: Diagnosis not present

## 2017-02-14 ENCOUNTER — Other Ambulatory Visit: Payer: Self-pay | Admitting: Family Medicine

## 2017-02-14 DIAGNOSIS — Z1231 Encounter for screening mammogram for malignant neoplasm of breast: Secondary | ICD-10-CM

## 2017-03-06 DIAGNOSIS — H1045 Other chronic allergic conjunctivitis: Secondary | ICD-10-CM | POA: Diagnosis not present

## 2017-03-06 DIAGNOSIS — J310 Chronic rhinitis: Secondary | ICD-10-CM | POA: Diagnosis not present

## 2017-03-06 DIAGNOSIS — J4520 Mild intermittent asthma, uncomplicated: Secondary | ICD-10-CM | POA: Diagnosis not present

## 2017-03-13 ENCOUNTER — Ambulatory Visit
Admission: RE | Admit: 2017-03-13 | Discharge: 2017-03-13 | Disposition: A | Discharge status: Home / Self Care | Payer: Medicare Other | Source: Ambulatory Visit | Attending: Family Medicine | Admitting: Family Medicine

## 2017-03-13 DIAGNOSIS — Z1231 Encounter for screening mammogram for malignant neoplasm of breast: Secondary | ICD-10-CM

## 2017-04-16 HISTORY — PX: OTHER SURGICAL HISTORY: SHX169

## 2017-04-30 NOTE — Progress Notes (Signed)
Office Visit Note  Patient: Felicia Rivera             Date of Birth: 1971/04/16           MRN: [id number]             PCP: Donald Prose, MD Referring: Donald Prose, MD Visit Date: 05/14/2017 Occupation: @GUAROCC @    Subjective:  Neck pain and lower back pain    History of Present Illness: Felicia Rivera is a 47 y.o. female with a history of fibromyalgia and trochanteric bursitis.  Patient states that she has been having a fibromyalgia flare over the past few weeks worsened by a recent URI.  She states she is having pain in her lower back, neck, and trapezius muscles.  She states she continues to take Baclofen.  She also goes to acupuncture once weekly and has a massage once a month.  She also ballroom dances for exercise about 1-2 times per week.   She continues to have significant insomnia and fatigue.  She states the Seroquel has not been helping her sleep.  Her psychiatrist is trying to wean her off of Klonopin.  She continues to have frequent tension headaches, but she denies any recent migraines.  She is still taking Topamax and sumatriptan as needed.  She states that her bilateral knees hurt occasionally but she denies any joint swelling.  She also has occasional hand pain.  She uses Voltaren gel on her joints, which helps.   Activities of Daily Living:  Patient reports morning stiffness for 2-4 hours.   Patient Reports nocturnal pain.  Difficulty dressing/grooming: Denies Difficulty climbing stairs: Reports Difficulty getting out of chair: Denies Difficulty using hands for taps, buttons, cutlery, and/or writing: Reports   Review of Systems  Constitutional: Positive for fatigue. Negative for weakness.  HENT: Negative for mouth sores, mouth dryness and nose dryness.   Eyes: Negative for redness, visual disturbance and dryness.  Respiratory: Negative for cough, hemoptysis, shortness of breath and difficulty breathing.   Cardiovascular: Negative for chest pain, palpitations,  hypertension, irregular heartbeat and swelling in legs/feet.  Gastrointestinal: Positive for constipation. Negative for blood in stool and diarrhea.  Endocrine: Negative for increased urination.  Genitourinary: Negative for painful urination.  Musculoskeletal: Positive for arthralgias, joint pain, myalgias, muscle weakness, morning stiffness, muscle tenderness and myalgias. Negative for joint swelling.  Skin: Negative for color change, pallor, rash, hair loss, nodules/bumps, redness, skin tightness, ulcers and sensitivity to sunlight.  Neurological: Positive for headaches. Negative for dizziness and numbness.  Hematological: Negative for swollen glands.  Psychiatric/Behavioral: Positive for depressed mood and sleep disturbance. The patient is nervous/anxious.     PMFS History:  Patient Active Problem List   Diagnosis Date Noted  . Other fatigue 07/25/2016  . Other insomnia 07/25/2016  . Bilateral sacroiliitis ([place]) 07/25/2016  . Trochanteric bursitis of both hips 07/25/2016  . Fibromyalgia 02/28/2016  . Ulnar neuropathy at elbow, left 02/28/2016  . Bipolar disorder ([place]) 02/28/2016  . Vitamin D deficiency 02/28/2016  . Migraine 02/28/2016  . RLS (restless legs syndrome) 02/28/2016  . IBS (irritable bowel syndrome) 02/28/2016  . GERD (gastroesophageal reflux disease) 02/28/2016    Past Medical History:  Diagnosis Date  . Bipolar 1 disorder ([place]) 02/28/2016  . Bipolar disorder ([place]) 02/28/2016  . Fibromyalgia 02/28/2016  . GERD (gastroesophageal reflux disease) 02/28/2016  . IBS (irritable bowel syndrome) 02/28/2016  . Migraine 02/28/2016  . RLS (restless legs syndrome) 02/28/2016  . Ulnar neuropathy at elbow, left  02/28/2016  . Vitamin D deficiency 02/28/2016    Family History  Problem Relation Age of Onset  . Diabetes Mother   . Glaucoma Mother   . Heart Problems Mother   . Hypertension Mother   . Congestive Heart Failure Mother   . Hypertension Father   . High  Cholesterol Father   . Heart Problems Father    Past Surgical History:  Procedure Laterality Date  . ABDOMINAL HYSTERECTOMY    . dental work  04/16/2017   Social History   Social History Narrative  . Not on file     Objective: Vital Signs: BP 107/74 (BP Location: Left Arm, Patient Position: Sitting, Cuff Size: Normal)   Pulse 92   Resp 13   Ht 5\' 4"  (1.626 m)   Wt 109 lb (49.4 kg)   BMI 18.71 kg/m    Physical Exam  Constitutional: She is oriented to person, place, and time. She appears well-developed and well-nourished.  HENT:  Head: Normocephalic and atraumatic.  Eyes: Conjunctivae and EOM are normal.  Neck: Normal range of motion.  Cardiovascular: Normal rate, regular rhythm, normal heart sounds and intact distal pulses.  Pulmonary/Chest: Effort normal and breath sounds normal.  Abdominal: Soft. Bowel sounds are normal.  Lymphadenopathy:    She has no cervical adenopathy.  Neurological: She is alert and oriented to person, place, and time.  Skin: Skin is warm and dry. Capillary refill takes less than 2 seconds.  Psychiatric: She has a normal mood and affect. Her behavior is normal.  Nursing note and vitals reviewed.    Musculoskeletal Exam: C-spine, thoracic, and lumbar limited ROM due to discomfort.  Shoulder joints, elbow joints, wrist joints, MCPs, PIPs, and DIPs good ROM with no synovitis.  Hip joints, knee joints, ankle joints, MTPs, PIPs, and DIPs good ROM with no synovitis.  Mild trochanteric bursa tenderness.  She has generalized hyperalgesia on exam.  18 out of 18 tender points.  No SI joint tenderness.   CDAI Exam: No CDAI exam completed.    Investigation: No additional findings. CBC Latest Ref Rng & Units 07/31/2016 05/27/2012 11/01/2010  WBC 3.8 - 10.8 K/uL 6.5 6.4 12.0(H)  Hemoglobin 11.7 - 15.5 g/dL 13.2 12.6 13.1  Hematocrit 35.0 - 45.0 % 40.3 37.7 39.5  Platelets 140 - 400 K/uL 226 181 228   CMP Latest Ref Rng & Units 07/31/2016 05/27/2012 11/01/2010    Glucose 65 - 99 mg/dL 105(H) 95 131(H)  BUN 7 - 25 mg/dL 9 8 12   Creatinine 0.50 - 1.10 mg/dL 1.42(H) 1.34(H) 1.04  Sodium 135 - 146 mmol/L 142 138 137  Potassium 3.5 - 5.3 mmol/L 5.4(H) 3.8 3.5  Chloride 98 - 110 mmol/L 110 104 104  CO2 20 - 31 mmol/L 19(L) 24 22  Calcium 8.6 - 10.2 mg/dL 10.2 9.2 9.4  Total Protein 6.1 - 8.1 g/dL 7.2 7.1 8.1  Total Bilirubin 0.2 - 1.2 mg/dL 0.4 0.3 0.4  Alkaline Phos 33 - 115 U/L 47 58 65  AST 10 - 35 U/L 19 15 19   ALT 6 - 29 U/L 10 5 16     Imaging: No results found.  Speciality Comments: No specialty comments available.    Procedures:  No procedures performed Allergies: Cogentin [benztropine]; Hydrocodone; Lyrica [pregabalin]; and Omnicef [cefdinir]   Assessment / Plan:     Visit Diagnoses: Fibromyalgia: She has been having increased generalized pain over the past few weeks.  She has significant muscle tension and tenderness in the trapezius region bilaterally.  She also is having neck stiffness.  She takes Baclofen 1 tablet three times a day as needed.  She is also having increased insomnia and fatigue.  She does not feel the Seroquel has been helping her sleep.  Her psychiatrist is trying to wean her off of Klonopin.  She continues to get acupuncture once weekly and massages once monthly.  She does ballroom dancing as a form of exercise.  We discussed the importance of good sleep hygiene and decreasing caffeine intake to help with insomnia.  She is also going to work on eating a healthier diet more regularly.   Ulnar neuropathy at elbow, left: She continues to have symptoms of neuropathy in her left hand.   Trochanteric bursitis of both hips: Only mild tenderness at this time.      Other fatigue: Chronic and related to insomnia.    Other insomnia: she takes Seroquel to help her sleep at night.  She is tapering off Klonopin. Discussed good sleep hygiene.     History of migraine: She continues to take Topamax daily and Sumatriptan as needed.    History of bipolar disorder  History of vitamin D deficiency   History of IBS  RLS (restless legs syndrome)  History of gastroesophageal reflux (GERD)    Orders: No orders of the defined types were placed in this encounter.  No orders of the defined types were placed in this encounter.   Face-to-face time spent with patient was 30 minutes. Greater than 50% of time was spent in counseling and coordination of care.  Follow-Up Instructions: Return in about 6 months (around 11/11/2017) for Fibromyalgia.   Bo Merino, MD  Note - This record has been created using [employer].  Chart creation errors have been sought, but may not always  have been located. Such creation errors do not reflect on  the standard of medical care.

## 2017-05-11 ENCOUNTER — Other Ambulatory Visit: Payer: Self-pay | Admitting: Rheumatology

## 2017-05-13 NOTE — Telephone Encounter (Signed)
Last Visit: 12/19/16 Next Visit: 05/14/17  Okay to refill per Dr. Estanislado Pandy

## 2017-05-14 ENCOUNTER — Ambulatory Visit (INDEPENDENT_AMBULATORY_CARE_PROVIDER_SITE_OTHER): Payer: Medicare Other | Admitting: Rheumatology

## 2017-05-14 ENCOUNTER — Encounter: Payer: Self-pay | Admitting: Rheumatology

## 2017-05-14 ENCOUNTER — Encounter (INDEPENDENT_AMBULATORY_CARE_PROVIDER_SITE_OTHER): Payer: Self-pay

## 2017-05-14 VITALS — BP 107/74 | HR 92 | Resp 13 | Ht 64.0 in | Wt 109.0 lb

## 2017-05-14 DIAGNOSIS — M797 Fibromyalgia: Secondary | ICD-10-CM | POA: Diagnosis not present

## 2017-05-14 DIAGNOSIS — M7061 Trochanteric bursitis, right hip: Secondary | ICD-10-CM

## 2017-05-14 DIAGNOSIS — G5622 Lesion of ulnar nerve, left upper limb: Secondary | ICD-10-CM | POA: Diagnosis not present

## 2017-05-14 DIAGNOSIS — Z8639 Personal history of other endocrine, nutritional and metabolic disease: Secondary | ICD-10-CM

## 2017-05-14 DIAGNOSIS — Z8659 Personal history of other mental and behavioral disorders: Secondary | ICD-10-CM

## 2017-05-14 DIAGNOSIS — G4709 Other insomnia: Secondary | ICD-10-CM | POA: Diagnosis not present

## 2017-05-14 DIAGNOSIS — M461 Sacroiliitis, not elsewhere classified: Secondary | ICD-10-CM | POA: Diagnosis not present

## 2017-05-14 DIAGNOSIS — Z8719 Personal history of other diseases of the digestive system: Secondary | ICD-10-CM

## 2017-05-14 DIAGNOSIS — M7062 Trochanteric bursitis, left hip: Secondary | ICD-10-CM | POA: Diagnosis not present

## 2017-05-14 DIAGNOSIS — G2581 Restless legs syndrome: Secondary | ICD-10-CM | POA: Diagnosis not present

## 2017-05-14 DIAGNOSIS — R5383 Other fatigue: Secondary | ICD-10-CM | POA: Diagnosis not present

## 2017-05-14 DIAGNOSIS — Z8669 Personal history of other diseases of the nervous system and sense organs: Secondary | ICD-10-CM

## 2017-05-17 DIAGNOSIS — Z0000 Encounter for general adult medical examination without abnormal findings: Secondary | ICD-10-CM | POA: Diagnosis not present

## 2017-05-17 DIAGNOSIS — Z23 Encounter for immunization: Secondary | ICD-10-CM | POA: Diagnosis not present

## 2017-05-17 DIAGNOSIS — Z1389 Encounter for screening for other disorder: Secondary | ICD-10-CM | POA: Diagnosis not present

## 2017-05-17 DIAGNOSIS — G43909 Migraine, unspecified, not intractable, without status migrainosus: Secondary | ICD-10-CM | POA: Diagnosis not present

## 2017-05-17 DIAGNOSIS — M797 Fibromyalgia: Secondary | ICD-10-CM | POA: Diagnosis not present

## 2017-05-17 DIAGNOSIS — F319 Bipolar disorder, unspecified: Secondary | ICD-10-CM | POA: Diagnosis not present

## 2017-05-17 DIAGNOSIS — E559 Vitamin D deficiency, unspecified: Secondary | ICD-10-CM | POA: Diagnosis not present

## 2017-05-17 DIAGNOSIS — E7800 Pure hypercholesterolemia, unspecified: Secondary | ICD-10-CM | POA: Diagnosis not present

## 2017-05-17 DIAGNOSIS — J4520 Mild intermittent asthma, uncomplicated: Secondary | ICD-10-CM | POA: Diagnosis not present

## 2017-06-03 DIAGNOSIS — Z113 Encounter for screening for infections with a predominantly sexual mode of transmission: Secondary | ICD-10-CM | POA: Diagnosis not present

## 2017-06-03 DIAGNOSIS — Z124 Encounter for screening for malignant neoplasm of cervix: Secondary | ICD-10-CM | POA: Diagnosis not present

## 2017-06-03 DIAGNOSIS — Z01419 Encounter for gynecological examination (general) (routine) without abnormal findings: Secondary | ICD-10-CM | POA: Diagnosis not present

## 2017-06-03 DIAGNOSIS — D069 Carcinoma in situ of cervix, unspecified: Secondary | ICD-10-CM | POA: Diagnosis not present

## 2017-06-04 DIAGNOSIS — Z124 Encounter for screening for malignant neoplasm of cervix: Secondary | ICD-10-CM | POA: Diagnosis not present

## 2017-07-28 ENCOUNTER — Other Ambulatory Visit: Payer: Self-pay | Admitting: Rheumatology

## 2017-07-29 NOTE — Telephone Encounter (Signed)
Last Visit: 05/14/17 Next visit: 11/19/17  Okay to refill per Dr. Estanislado Pandy

## 2017-08-05 ENCOUNTER — Other Ambulatory Visit: Payer: Self-pay | Admitting: Rheumatology

## 2017-08-12 DIAGNOSIS — F319 Bipolar disorder, unspecified: Secondary | ICD-10-CM | POA: Diagnosis not present

## 2017-10-07 DIAGNOSIS — F319 Bipolar disorder, unspecified: Secondary | ICD-10-CM | POA: Diagnosis not present

## 2017-11-05 NOTE — Progress Notes (Signed)
Office Visit Note  Patient: Felicia Rivera             Date of Birth: 04-27-1971           MRN: [id number]             PCP: Donald Prose, MD Referring: Donald Prose, MD Visit Date: 11/19/2017 Occupation: @GUAROCC @    Subjective:  Right foot pain   History of Present Illness: Felicia Rivera is a 47 y.o. female with history of fibromyalgia.  She takes baclofen 10 mg 1 tablet by mouth 3 times daily as needed for muscle spasms.  She is on Topamax 25 mg 3 tablets by mouth daily.  She uses Voltaren gel PRN.  She continues to have generalized muscle aches and muscle tenderness due to fibromyalgia.  She is Biofreeze on a daily basis for trapezius muscle tension muscle tenderness.  She states that about 2 months ago she had a flare that lasted 2 weeks.  She denies any recent full-blown flare since then.  She states she has not had any recent migraines.  She reports that she continues to ballroom dance on a regular basis.  She states she has a performance coming up in mid-September.  She states she continues to have chronic fatigue and insomnia.  She also is having tenderness of bilateral trochanteric bursa.   Activities of Daily Living:  Patient reports morning stiffness for 1 hour.   Patient Reports nocturnal pain.  Difficulty dressing/grooming: Denies Difficulty climbing stairs: Denies Difficulty getting out of chair: Denies Difficulty using hands for taps, buttons, cutlery, and/or writing: Reports   Review of Systems  Constitutional: Positive for fatigue.  HENT: Positive for mouth dryness. Negative for mouth sores and nose dryness.   Eyes: Positive for dryness. Negative for pain and visual disturbance.  Respiratory: Negative for cough, hemoptysis, shortness of breath and difficulty breathing.   Cardiovascular: Negative for chest pain, palpitations, hypertension and swelling in legs/feet.  Gastrointestinal: Positive for constipation. Negative for blood in stool and diarrhea.  Endocrine:  Negative for increased urination.  Genitourinary: Negative for painful urination.  Musculoskeletal: Positive for arthralgias, joint pain, myalgias, morning stiffness, muscle tenderness and myalgias. Negative for joint swelling and muscle weakness.  Skin: Negative for color change, pallor, rash, hair loss, nodules/bumps, skin tightness, ulcers and sensitivity to sunlight.  Allergic/Immunologic: Negative for susceptible to infections.  Neurological: Negative for dizziness, numbness, headaches and weakness.  Hematological: Negative for swollen glands.  Psychiatric/Behavioral: Positive for depressed mood and sleep disturbance. The patient is nervous/anxious.     PMFS History:  Patient Active Problem List   Diagnosis Date Noted  . Other fatigue 07/25/2016  . Other insomnia 07/25/2016  . Bilateral sacroiliitis ([place]) 07/25/2016  . Trochanteric bursitis of both hips 07/25/2016  . Fibromyalgia 02/28/2016  . Ulnar neuropathy at elbow, left 02/28/2016  . Bipolar disorder ([place]) 02/28/2016  . Vitamin D deficiency 02/28/2016  . Migraine 02/28/2016  . RLS (restless legs syndrome) 02/28/2016  . IBS (irritable bowel syndrome) 02/28/2016  . GERD (gastroesophageal reflux disease) 02/28/2016    Past Medical History:  Diagnosis Date  . Bipolar 1 disorder ([place]) 02/28/2016  . Bipolar disorder ([place]) 02/28/2016  . Fibromyalgia 02/28/2016  . GERD (gastroesophageal reflux disease) 02/28/2016  . IBS (irritable bowel syndrome) 02/28/2016  . Migraine 02/28/2016  . RLS (restless legs syndrome) 02/28/2016  . Ulnar neuropathy at elbow, left 02/28/2016  . Vitamin D deficiency 02/28/2016    Family History  Problem Relation Age of Onset  .  Diabetes Mother   . Glaucoma Mother   . Heart Problems Mother   . Hypertension Mother   . Congestive Heart Failure Mother   . Rheum arthritis Mother        possibly  . Hypertension Father   . High Cholesterol Father   . Heart Problems Father    Past Surgical  History:  Procedure Laterality Date  . ABDOMINAL HYSTERECTOMY    . dental work  04/16/2017   x2   Social History   Social History Narrative  . Not on file     Objective: Vital Signs: BP 95/67 (BP Location: Left Arm, Patient Position: Sitting, Cuff Size: Normal)   Pulse (!) 109   Resp 14   Ht 5\' 4"  (1.626 m)   Wt 112 lb (50.8 kg)   BMI 19.22 kg/m    Physical Exam  Constitutional: She is oriented to person, place, and time. She appears well-developed and well-nourished.  HENT:  Head: Normocephalic and atraumatic.  Eyes: Conjunctivae and EOM are normal.  Neck: Normal range of motion.  Cardiovascular: Normal rate, regular rhythm, normal heart sounds and intact distal pulses.  Pulmonary/Chest: Effort normal and breath sounds normal.  Abdominal: Soft. Bowel sounds are normal.  Lymphadenopathy:    She has no cervical adenopathy.  Neurological: She is alert and oriented to person, place, and time.  Skin: Skin is warm and dry. Capillary refill takes less than 2 seconds.  Psychiatric: She has a normal mood and affect. Her behavior is normal.  Nursing note and vitals reviewed.    Musculoskeletal Exam: C-spine limited range of motion with discomfort. Tension and tenderness in bilateral trapezius muscles. Thoracic and lumbar spine good range of motion.  No midline spinal tenderness.  She has tenderness of bilateral SI joints.  Shoulder joints, elbow joints, wrist joints, MCPs, PIPs, and DIPs good ROM with no synovitis.  She is complete fist formation bilaterally.  Hip joints, knee joints, ankle joints, MTPs, PIPs, DIPs good range of motion no synovitis.  No warmth or effusion of knee joints.  Tenderness of bilateral trochanteric bursa.    CDAI Exam: No CDAI exam completed.    Investigation: No additional findings.   Imaging: Xr Foot Complete Right  Result Date: 11/19/2017 Minimal MTP PIP DIP narrowing was noted.  No fracture was noted.  No intertarsal or tibiotalar joint space  narrowing was noted. Impression: Unremarkable x-ray of the foot.   Speciality Comments: No specialty comments available.    Procedures:  No procedures performed Allergies: Cogentin [benztropine]; Hydrocodone; Lyrica [pregabalin]; and Omnicef [cefdinir]   Assessment / Plan:     Visit Diagnoses: Fibromyalgia: She continues to have generalized muscle aches and muscle tenderness due to fibromyalgia.  She had a fibromyalgia flare about 2 months ago lasted for 2 weeks.  She has not had any recent fibromyalgia flare since.  She has tenderness and tension in bilateral trapezius muscles.  She declined trigger point injections today.  She uses Biofreeze, heating pad, and a TENS unit daily.  She also gets massages periodically.  She continues to take baclofen 10 mg as needed for muscle spasms.  She also takes Topamax 25 mg 3 times daily.  She uses Voltaren gel topically as needed.  She does not need any refills of her medications today.  She was encouraged to continue to perform a exercise and stretches on a daily basis.  She continues to ballroom dance on a weekly basis.  We discussed stress relieving habits and good sleep hygiene.  Trochanteric bursitis of both hips: She has tenderness of bilateral trochanteric bursa.  Ulnar neuropathy at elbow, left: Chronic.  Other fatigue: Chronic.  Other insomnia: She continues to have insomnia.  She has very interrupted sleep at night.  Good sleep hygiene was discussed.  Pain in right foot -She has been having increased pain at the head of the second metatarsal.  No synovitis was noted.  A x-ray of the right foot was obtained today.  X-ray was unremarkable.  She was recommended to use Voltaren gel.   We discussed proper fitting shoes. Plan: XR Foot Complete Right   Other medical conditions are listed as follows:   History of bipolar disorder  History of vitamin D deficiency  RLS (restless legs syndrome)  History of gastroesophageal reflux  (GERD)  History of IBS  History of migraine - She continues to take Topamax daily and Sumatriptan as needed.      Orders: Orders Placed This Encounter  Procedures  . XR Foot Complete Right   No orders of the defined types were placed in this encounter.   Face-to-face time spent with patient was 30 minutes. Greater than 50% of time was spent in counseling and coordination of care.  Follow-Up Instructions: Return in about 6 months (around 05/22/2018) for Fibromyalgia.   Hazel Sams PA-C  I examined and evaluated the patient with Hazel Sams PA.  Patient had no synovitis on examination.  She had tenderness over right second digit of her foot.  The x-ray was unremarkable.  The plan of care was discussed as noted above.   Bo Merino, MD  Note - This record has been created using Editor, commissioning.  Chart creation errors have been sought, but may not always  have been located. Such creation errors do not reflect on  the standard of medical care.

## 2017-11-19 ENCOUNTER — Encounter: Payer: Self-pay | Admitting: Rheumatology

## 2017-11-19 ENCOUNTER — Ambulatory Visit (INDEPENDENT_AMBULATORY_CARE_PROVIDER_SITE_OTHER): Payer: Medicare Other | Admitting: Rheumatology

## 2017-11-19 ENCOUNTER — Ambulatory Visit (INDEPENDENT_AMBULATORY_CARE_PROVIDER_SITE_OTHER): Payer: Medicare Other

## 2017-11-19 VITALS — BP 95/67 | HR 109 | Resp 14 | Ht 64.0 in | Wt 112.0 lb

## 2017-11-19 DIAGNOSIS — Z8659 Personal history of other mental and behavioral disorders: Secondary | ICD-10-CM

## 2017-11-19 DIAGNOSIS — M79671 Pain in right foot: Secondary | ICD-10-CM | POA: Diagnosis not present

## 2017-11-19 DIAGNOSIS — G2581 Restless legs syndrome: Secondary | ICD-10-CM

## 2017-11-19 DIAGNOSIS — M7062 Trochanteric bursitis, left hip: Secondary | ICD-10-CM | POA: Diagnosis not present

## 2017-11-19 DIAGNOSIS — Z8669 Personal history of other diseases of the nervous system and sense organs: Secondary | ICD-10-CM

## 2017-11-19 DIAGNOSIS — Z8639 Personal history of other endocrine, nutritional and metabolic disease: Secondary | ICD-10-CM | POA: Diagnosis not present

## 2017-11-19 DIAGNOSIS — Z8719 Personal history of other diseases of the digestive system: Secondary | ICD-10-CM | POA: Diagnosis not present

## 2017-11-19 DIAGNOSIS — G5622 Lesion of ulnar nerve, left upper limb: Secondary | ICD-10-CM

## 2017-11-19 DIAGNOSIS — R5383 Other fatigue: Secondary | ICD-10-CM | POA: Diagnosis not present

## 2017-11-19 DIAGNOSIS — M7061 Trochanteric bursitis, right hip: Secondary | ICD-10-CM

## 2017-11-19 DIAGNOSIS — G4709 Other insomnia: Secondary | ICD-10-CM | POA: Diagnosis not present

## 2017-11-19 DIAGNOSIS — M797 Fibromyalgia: Secondary | ICD-10-CM | POA: Diagnosis not present

## 2017-12-10 DIAGNOSIS — F319 Bipolar disorder, unspecified: Secondary | ICD-10-CM | POA: Diagnosis not present

## 2018-01-23 ENCOUNTER — Other Ambulatory Visit: Payer: Self-pay | Admitting: Rheumatology

## 2018-01-23 NOTE — Telephone Encounter (Signed)
Last Visit: 11/19/17 Next Visit: 05/21/18  Okay to refill per Dr. Estanislado Pandy

## 2018-01-31 ENCOUNTER — Other Ambulatory Visit: Payer: Self-pay | Admitting: Family Medicine

## 2018-01-31 DIAGNOSIS — Z1231 Encounter for screening mammogram for malignant neoplasm of breast: Secondary | ICD-10-CM

## 2018-02-19 DIAGNOSIS — Z23 Encounter for immunization: Secondary | ICD-10-CM | POA: Diagnosis not present

## 2018-03-05 DIAGNOSIS — J4520 Mild intermittent asthma, uncomplicated: Secondary | ICD-10-CM | POA: Diagnosis not present

## 2018-03-05 DIAGNOSIS — H1045 Other chronic allergic conjunctivitis: Secondary | ICD-10-CM | POA: Diagnosis not present

## 2018-03-05 DIAGNOSIS — J310 Chronic rhinitis: Secondary | ICD-10-CM | POA: Diagnosis not present

## 2018-03-11 DIAGNOSIS — F319 Bipolar disorder, unspecified: Secondary | ICD-10-CM | POA: Diagnosis not present

## 2018-03-19 ENCOUNTER — Ambulatory Visit
Admission: RE | Admit: 2018-03-19 | Discharge: 2018-03-19 | Disposition: A | Discharge status: Home / Self Care | Payer: Medicare Other | Source: Ambulatory Visit | Attending: Family Medicine | Admitting: Family Medicine

## 2018-03-19 DIAGNOSIS — Z1231 Encounter for screening mammogram for malignant neoplasm of breast: Secondary | ICD-10-CM

## 2018-05-08 NOTE — Progress Notes (Signed)
Office Visit Note  Patient: Felicia Rivera             Date of Birth: [birth year]           MRN: [id number]             PCP: Donald Prose, MD Referring: Donald Prose, MD Visit Date: 05/21/2018 Occupation: @GUAROCC @  Subjective:  Lower back pain   History of Present Illness: Felicia Rivera is a 48 y.o. female with history of fibromyalgia.  She continues to take Baclofen 10 mg TID PRN.  She takes Topamax 25 mg 3 tablets at bedtime.  She has chronic fatigue and insomnia.  She reports she has had a cough for over 1 month and was taking Mucinex for symptomatic relief. She states she was evaluated by her PCP.  Her cough has resolved.  She has been more sedentary lately due to recovering from the cough.  She states she is having increased generalized muscle aches and muscle tenderness.  She states her pain is a 7/10.  She continues to have trochanteric bursitis bilaterally.  She uses a heating pad and biofreeze.  She uses a TENS unit occasionally. She has been stretching on a regular basis. She has restarted dancing on a regular basis.  She has tried to work on core strengthening to improve her lower back pain.   Activities of Daily Living:  Patient reports morning stiffness for 10-15 minutes.   Patient Reports nocturnal pain.  Difficulty dressing/grooming: Denies Difficulty climbing stairs: Denies Difficulty getting out of chair: Denies Difficulty using hands for taps, buttons, cutlery, and/or writing: Reports  Review of Systems  Constitutional: Positive for fatigue.  HENT: Positive for mouth dryness. Negative for mouth sores and nose dryness.   Eyes: Negative for pain, visual disturbance and dryness.  Respiratory: Negative for cough, hemoptysis, shortness of breath and difficulty breathing.   Cardiovascular: Negative for chest pain, palpitations, hypertension and swelling in legs/feet.  Gastrointestinal: Negative for blood in stool, constipation and diarrhea.  Endocrine: Negative for  increased urination.  Genitourinary: Negative for painful urination.  Musculoskeletal: Positive for arthralgias, joint pain, myalgias, muscle tenderness and myalgias. Negative for joint swelling, muscle weakness and morning stiffness.  Skin: Negative for color change, pallor, rash, hair loss, nodules/bumps, skin tightness, ulcers and sensitivity to sunlight.  Allergic/Immunologic: Negative for susceptible to infections.  Neurological: Negative for dizziness, numbness, headaches and weakness.  Hematological: Negative for swollen glands.  Psychiatric/Behavioral: Positive for sleep disturbance. Negative for depressed mood. The patient is not nervous/anxious.     PMFS History:  Patient Active Problem List   Diagnosis Date Noted  . Other fatigue 07/25/2016  . Other insomnia 07/25/2016  . Bilateral sacroiliitis ([place]) 07/25/2016  . Trochanteric bursitis of both hips 07/25/2016  . Fibromyalgia 02/28/2016  . Ulnar neuropathy at elbow, left 02/28/2016  . Bipolar disorder ([place]) 02/28/2016  . Vitamin D deficiency 02/28/2016  . Migraine 02/28/2016  . RLS (restless legs syndrome) 02/28/2016  . IBS (irritable bowel syndrome) 02/28/2016  . GERD (gastroesophageal reflux disease) 02/28/2016    Past Medical History:  Diagnosis Date  . Bipolar 1 disorder ([place]) 02/28/2016  . Bipolar disorder ([place]) 02/28/2016  . Fibromyalgia 02/28/2016  . GERD (gastroesophageal reflux disease) 02/28/2016  . IBS (irritable bowel syndrome) 02/28/2016  . Migraine 02/28/2016  . RLS (restless legs syndrome) 02/28/2016  . Ulnar neuropathy at elbow, left 02/28/2016  . Vitamin D deficiency 02/28/2016    Family History  Problem Relation Age of Onset  .  Diabetes Mother   . Glaucoma Mother   . Heart Problems Mother   . Hypertension Mother   . Congestive Heart Failure Mother   . Rheum arthritis Mother        possibly  . Hypertension Father   . High Cholesterol Father   . Heart Problems Father    Past Surgical  History:  Procedure Laterality Date  . ABDOMINAL HYSTERECTOMY    . dental work  04/16/2017   x2   Social History   Social History Narrative  . Not on file    There is no immunization history on file for this patient.   Objective: Vital Signs: BP 97/66 (BP Location: Left Arm, Patient Position: Sitting, Cuff Size: Normal)   Pulse 88   Resp 14   Ht 5' 3.25" (1.607 m)   Wt 106 lb 12.8 oz (48.4 kg)   BMI 18.77 kg/m    Physical Exam Vitals signs and nursing note reviewed.  Constitutional:      Appearance: She is well-developed.  HENT:     Head: Normocephalic and atraumatic.  Eyes:     Conjunctiva/sclera: Conjunctivae normal.  Neck:     Musculoskeletal: Normal range of motion.  Cardiovascular:     Rate and Rhythm: Normal rate and regular rhythm.     Heart sounds: Normal heart sounds.  Pulmonary:     Effort: Pulmonary effort is normal.     Breath sounds: Normal breath sounds.  Abdominal:     General: Bowel sounds are normal.     Palpations: Abdomen is soft.  Lymphadenopathy:     Cervical: No cervical adenopathy.  Skin:    General: Skin is warm and dry.     Capillary Refill: Capillary refill takes less than 2 seconds.  Neurological:     Mental Status: She is alert and oriented to person, place, and time.  Psychiatric:        Behavior: Behavior normal.      Musculoskeletal Exam: C-spine limited ROM with lateral rotation with some discomfort.  Thoracic and lumbar spine good ROM.  Discomfort with lumbar ROM. Shoulder joints, elbow joints, wrist joints, MCPs, PIPs,and DIPs good ROM with no synovitis.  Mild PIP and DIP synovial thickening consistent with osteoarthritis.  Hip joints, knee joints, ankle joints, MTPs, PIPs, and DIPs good ROM with no synovitis.  No warmth or effusion of knee joints.  Mild tenderness over bilateral trochanteric bursa.   CDAI Exam: CDAI Score: Not documented Patient Global Assessment: Not documented; Provider Global Assessment: Not  documented Swollen: Not documented; Tender: Not documented Joint Exam   Not documented   There is currently no information documented on the homunculus. Go to the Rheumatology activity and complete the homunculus joint exam.  Investigation: No additional findings.  Imaging: No results found.  Recent Labs: Lab Results  Component Value Date   WBC 6.5 07/31/2016   HGB 13.2 07/31/2016   PLT 226 07/31/2016   NA 142 07/31/2016   K 5.4 (H) 07/31/2016   CL 110 07/31/2016   CO2 19 (L) 07/31/2016   GLUCOSE 105 (H) 07/31/2016   BUN 9 07/31/2016   CREATININE 1.42 (H) 07/31/2016   BILITOT 0.4 07/31/2016   ALKPHOS 47 07/31/2016   AST 19 07/31/2016   ALT 10 07/31/2016   PROT 7.2 07/31/2016   ALBUMIN 4.3 07/31/2016   CALCIUM 10.2 07/31/2016   GFRAA 51 (L) 07/31/2016    Speciality Comments: No specialty comments available.  Procedures:  No procedures performed Allergies: Cogentin [benztropine];  Hydrocodone; Lyrica [pregabalin]; and Omnicef [cefdinir]   Assessment / Plan:     Visit Diagnoses: Fibromyalgia -she continues to have generalized hyperalgesia on exam.  She has positive tender points on exam today.  She has been more sedentary recently due to having a cough for over 1 month.  She is having increased muscle aches and muscle tenderness. She takes baclofen 10 mg 3 times daily as needed for muscle spasms.  She has been having increased lower back pain.  We discussed the importance of core strengthening and performing lower back exercises.  She was given a handout of exercises that she can perform at home.  She is been having some increased discomfort in the left knee joint but no warmth or effusion was noted.  She was given a handout of knee exercises that she can perform at home.  We discussed the importance of quad strengthening.   She continues to have chronic fatigue and insomnia.  She takes Topamax 25 mg 3 tablets by mouth daily.  She continues to use Biofreeze and salon pause  patches on a regular basis.  She uses a heating pad to help with muscle tension.  She continues to use the TENS unit on a regular basis.  She is not needing refills of her medications at this time.  She was encouraged to stay active and exercise on a regular basis.  Good sleep hygiene was discussed.  She will follow-up in the office in 6 months.  Biofreeze, heating pad, and a TENS unit daily  Trochanteric bursitis of both hips: She has mild tenderness over bilateral trochanteric bursa.  She perform stretching exercises on a regular basis.  Chronic pain of left knee: She has been having some increased discomfort in the left knee joint.  She has been dancing on a regular basis.  No warmth or effusion was noted on exam today.  She has no mechanical symptoms.  She was given a handout of knee exercises that she can perform at home.  She was encouraged to use Voltaren gel topically for pain relief.  Ulnar neuropathy at elbow, left: She experiences intermittent discomfort.  Other fatigue: She has chronic fatigue related to insomnia.  She takes Topamax 25 mg 3 tablets by mouth at bedtime.  Other insomnia: She continues to have chronic insomnia.  She is interrupted sleep at night due to the discomfort she experiences.  Other medical conditions are listed as follows:  History of vitamin D deficiency  RLS (restless legs syndrome)  History of gastroesophageal reflux (GERD)  History of bipolar disorder  History of IBS  History of migraine   Orders: No orders of the defined types were placed in this encounter.  No orders of the defined types were placed in this encounter.   Face-to-face time spent with patient was 30 minutes. Greater than 50% of time was spent in counseling and coordination of care.  Follow-Up Instructions: Return in about 6 months (around 11/19/2018) for Fibromyalgia.   [name], PA-C  Note - This record has been created using Dragon software.  Chart creation errors  have been sought, but may not always  have been located. Such creation errors do not reflect on  the standard of medical care.

## 2018-05-15 DIAGNOSIS — F319 Bipolar disorder, unspecified: Secondary | ICD-10-CM | POA: Diagnosis not present

## 2018-05-20 DIAGNOSIS — F319 Bipolar disorder, unspecified: Secondary | ICD-10-CM | POA: Diagnosis not present

## 2018-05-20 DIAGNOSIS — E559 Vitamin D deficiency, unspecified: Secondary | ICD-10-CM | POA: Diagnosis not present

## 2018-05-20 DIAGNOSIS — Z0000 Encounter for general adult medical examination without abnormal findings: Secondary | ICD-10-CM | POA: Diagnosis not present

## 2018-05-20 DIAGNOSIS — J4520 Mild intermittent asthma, uncomplicated: Secondary | ICD-10-CM | POA: Diagnosis not present

## 2018-05-20 DIAGNOSIS — E7800 Pure hypercholesterolemia, unspecified: Secondary | ICD-10-CM | POA: Diagnosis not present

## 2018-05-20 DIAGNOSIS — G43909 Migraine, unspecified, not intractable, without status migrainosus: Secondary | ICD-10-CM | POA: Diagnosis not present

## 2018-05-20 DIAGNOSIS — M797 Fibromyalgia: Secondary | ICD-10-CM | POA: Diagnosis not present

## 2018-05-20 DIAGNOSIS — Z1389 Encounter for screening for other disorder: Secondary | ICD-10-CM | POA: Diagnosis not present

## 2018-05-21 ENCOUNTER — Encounter: Payer: Self-pay | Admitting: Physician Assistant

## 2018-05-21 ENCOUNTER — Ambulatory Visit (INDEPENDENT_AMBULATORY_CARE_PROVIDER_SITE_OTHER): Payer: Medicare Other | Admitting: Physician Assistant

## 2018-05-21 VITALS — BP 97/66 | HR 88 | Resp 14 | Ht 63.25 in | Wt 106.8 lb

## 2018-05-21 DIAGNOSIS — G8929 Other chronic pain: Secondary | ICD-10-CM

## 2018-05-21 DIAGNOSIS — G4709 Other insomnia: Secondary | ICD-10-CM | POA: Diagnosis not present

## 2018-05-21 DIAGNOSIS — Z8719 Personal history of other diseases of the digestive system: Secondary | ICD-10-CM | POA: Diagnosis not present

## 2018-05-21 DIAGNOSIS — M797 Fibromyalgia: Secondary | ICD-10-CM

## 2018-05-21 DIAGNOSIS — R5383 Other fatigue: Secondary | ICD-10-CM

## 2018-05-21 DIAGNOSIS — Z8659 Personal history of other mental and behavioral disorders: Secondary | ICD-10-CM | POA: Diagnosis not present

## 2018-05-21 DIAGNOSIS — Z8639 Personal history of other endocrine, nutritional and metabolic disease: Secondary | ICD-10-CM

## 2018-05-21 DIAGNOSIS — M7061 Trochanteric bursitis, right hip: Secondary | ICD-10-CM | POA: Diagnosis not present

## 2018-05-21 DIAGNOSIS — Z8669 Personal history of other diseases of the nervous system and sense organs: Secondary | ICD-10-CM

## 2018-05-21 DIAGNOSIS — M25562 Pain in left knee: Secondary | ICD-10-CM

## 2018-05-21 DIAGNOSIS — G5622 Lesion of ulnar nerve, left upper limb: Secondary | ICD-10-CM

## 2018-05-21 DIAGNOSIS — G2581 Restless legs syndrome: Secondary | ICD-10-CM | POA: Diagnosis not present

## 2018-05-21 DIAGNOSIS — M7062 Trochanteric bursitis, left hip: Secondary | ICD-10-CM

## 2018-05-21 NOTE — Patient Instructions (Signed)
Back Exercises If you have pain in your back, do these exercises 2-3 times each day or as told by your doctor. When the pain goes away, do the exercises once each day, but repeat the steps more times for each exercise (do more repetitions). If you do not have pain in your back, do these exercises once each day or as told by your doctor. Exercises Single Knee to Chest Do these steps 3-5 times in a row for each leg: 1. Lie on your back on a firm bed or the floor with your legs stretched out. 2. Bring one knee to your chest. 3. Hold your knee to your chest by grabbing your knee or thigh. 4. Pull on your knee until you feel a gentle stretch in your lower back. 5. Keep doing the stretch for 10-30 seconds. 6. Slowly let go of your leg and straighten it. Pelvic Tilt Do these steps 5-10 times in a row: 1. Lie on your back on a firm bed or the floor with your legs stretched out. 2. Bend your knees so they point up to the ceiling. Your feet should be flat on the floor. 3. Tighten your lower belly (abdomen) muscles to press your lower back against the floor. This will make your tailbone point up to the ceiling instead of pointing down to your feet or the floor. 4. Stay in this position for 5-10 seconds while you gently tighten your muscles and breathe evenly. Cat-Cow Do these steps until your lower back bends more easily: 1. Get on your hands and knees on a firm surface. Keep your hands under your shoulders, and keep your knees under your hips. You may put padding under your knees. 2. Let your head hang down, and make your tailbone point down to the floor so your lower back is round like the back of a cat. 3. Stay in this position for 5 seconds. 4. Slowly lift your head and make your tailbone point up to the ceiling so your back hangs low (sags) like the back of a cow. 5. Stay in this position for 5 seconds.  Press-Ups Do these steps 5-10 times in a row: 1. Lie on your belly (face-down) on the  floor. 2. Place your hands near your head, about shoulder-width apart. 3. While you keep your back relaxed and keep your hips on the floor, slowly straighten your arms to raise the top half of your body and lift your shoulders. Do not use your back muscles. To make yourself more comfortable, you may change where you place your hands. 4. Stay in this position for 5 seconds. 5. Slowly return to lying flat on the floor.  Bridges Do these steps 10 times in a row: 1. Lie on your back on a firm surface. 2. Bend your knees so they point up to the ceiling. Your feet should be flat on the floor. 3. Tighten your butt muscles and lift your butt off of the floor until your waist is almost as high as your knees. If you do not feel the muscles working in your butt and the back of your thighs, slide your feet 1-2 inches farther away from your butt. 4. Stay in this position for 3-5 seconds. 5. Slowly lower your butt to the floor, and let your butt muscles relax. If this exercise is too easy, try doing it with your arms crossed over your chest. Belly Crunches Do these steps 5-10 times in a row: 1. Lie on your back on a  firm bed or the floor with your legs stretched out. 2. Bend your knees so they point up to the ceiling. Your feet should be flat on the floor. 3. Cross your arms over your chest. 4. Tip your chin a little bit toward your chest but do not bend your neck. 5. Tighten your belly muscles and slowly raise your chest just enough to lift your shoulder blades a tiny bit off of the floor. 6. Slowly lower your chest and your head to the floor. Back Lifts Do these steps 5-10 times in a row: 1. Lie on your belly (face-down) with your arms at your sides, and rest your forehead on the floor. 2. Tighten the muscles in your legs and your butt. 3. Slowly lift your chest off of the floor while you keep your hips on the floor. Keep the back of your head in line with the curve in your back. Look at the floor  while you do this. 4. Stay in this position for 3-5 seconds. 5. Slowly lower your chest and your face to the floor. Contact a doctor if:  Your back pain gets a lot worse when you do an exercise.  Your back pain does not lessen 2 hours after you exercise. If you have any of these problems, stop doing the exercises. Do not do them again unless your doctor says it is okay. Get help right away if:  You have sudden, very bad back pain. If this happens, stop doing the exercises. Do not do them again unless your doctor says it is okay. This information is not intended to replace advice given to you by your health care provider. Make sure you discuss any questions you have with your health care provider. Document Released: 05/19/2010 Document Revised: 01/08/2018 Document Reviewed: 06/10/2014 Elsevier Interactive Patient Education  2019 [employer]. Knee Exercises              Ask your health care provider which exercises are safe for you. Do exercises exactly as told by your health care provider and adjust them as directed. It is normal to feel mild stretching, pulling, tightness, or discomfort as you do these exercises, but you should stop right away if you feel sudden pain or your pain gets worse.Do not begin these exercises until told by your health care provider. STRETCHING AND RANGE OF MOTION EXERCISES These exercises warm up your muscles and joints and improve the movement and flexibility of your knee. These exercises also help to relieve pain, numbness, and tingling. Exercise A: Knee Extension, Prone 1. Lie on your abdomen on a bed. 2. Place your left / right knee just beyond the edge of the surface so your knee is not on the bed. You can put a towel under your left / right thigh just above your knee for comfort. 3. Relax your leg muscles and allow gravity to straighten your knee. You should feel a stretch behind your left / right knee. 4. Hold this position for __________  seconds. 5. Scoot up so your knee is supported between repetitions. Repeat __________ times. Complete this stretch __________ times a day. Exercise B: Knee Flexion, Active 1. Lie on your back with both knees straight. If this causes back discomfort, bend your left / right knee so your foot is flat on the floor. 2. Slowly slide your left / right heel back toward your buttocks until you feel a gentle stretch in the front of your knee or thigh. 3. Hold this position for __________  seconds. 4. Slowly slide your left / right heel back to the starting position. Repeat __________ times. Complete this exercise __________ times a day. Exercise C: Quadriceps, Prone 1. Lie on your abdomen on a firm surface, such as a bed or padded floor. 2. Bend your left / right knee and hold your ankle. If you cannot reach your ankle or pant leg, loop a belt around your foot and grab the belt instead. 3. Gently pull your heel toward your buttocks. Your knee should not slide out to the side. You should feel a stretch in the front of your thigh and knee. 4. Hold this position for __________ seconds. Repeat __________ times. Complete this stretch __________ times a day. Exercise D: Hamstring, Supine 1. Lie on your back. 2. Loop a belt or towel over the ball of your left / right foot. The ball of your foot is on the walking surface, right under your toes. 3. Straighten your left / right knee and slowly pull on the belt to raise your leg until you feel a gentle stretch behind your knee. ? Do not let your left / right knee bend while you do this. ? Keep your other leg flat on the floor. 4. Hold this position for __________ seconds. Repeat __________ times. Complete this stretch __________ times a day. STRENGTHENING EXERCISES These exercises build strength and endurance in your knee. Endurance is the ability to use your muscles for a long time, even after they get tired. Exercise E: Quadriceps, Isometric 1. Lie on your  back with your left / right leg extended and your other knee bent. Put a rolled towel or small pillow under your knee if told by your health care provider. 2. Slowly tense the muscles in the front of your left / right thigh. You should see your kneecap slide up toward your hip or see increased dimpling just above the knee. This motion will push the back of the knee toward the floor. 3. For __________ seconds, keep the muscle as tight as you can without increasing your pain. 4. Relax the muscles slowly and completely. Repeat __________ times. Complete this exercise __________ times a day. Exercise F: Straight Leg Raises - Quadriceps 1. Lie on your back with your left / right leg extended and your other knee bent. 2. Tense the muscles in the front of your left / right thigh. You should see your kneecap slide up or see increased dimpling just above the knee. Your thigh may even shake a bit. 3. Keep these muscles tight as you raise your leg 4-6 inches (10-15 cm) off the floor. Do not let your knee bend. 4. Hold this position for __________ seconds. 5. Keep these muscles tense as you lower your leg. 6. Relax your muscles slowly and completely after each repetition. Repeat __________ times. Complete this exercise __________ times a day. Exercise G: Hamstring, Isometric 1. Lie on your back on a firm surface. 2. Bend your left / right knee approximately __________ degrees. 3. Dig your left / right heel into the surface as if you are trying to pull it toward your buttocks. Tighten the muscles in the back of your thighs to dig as hard as you can without increasing any pain. 4. Hold this position for __________ seconds. 5. Release the tension gradually and allow your muscles to relax completely for __________ seconds after each repetition. Repeat __________ times. Complete this exercise __________ times a day. Exercise H: Hamstring Curls If told by your health care provider, do  this exercise while wearing  ankle weights. Begin with __________ weights. Then increase the weight by 1 lb (0.5 kg) increments. Do not wear ankle weights that are more than __________. 1. Lie on your abdomen with your legs straight. 2. Bend your left / right knee as far as you can without feeling pain. Keep your hips flat against the floor. 3. Hold this position for __________ seconds. 4. Slowly lower your leg to the starting position. Repeat __________ times. Complete this exercise __________ times a day. Exercise I: Squats (Quadriceps) 1. Stand in front of a table, with your feet and knees pointing straight ahead. You may rest your hands on the table for balance but not for support. 2. Slowly bend your knees and lower your hips like you are going to sit in a chair. ? Keep your weight over your heels, not over your toes. ? Keep your lower legs upright so they are parallel with the table legs. ? Do not let your hips go lower than your knees. ? Do not bend lower than told by your health care provider. ? If your knee pain increases, do not bend as low. 3. Hold the squat position for __________ seconds. 4. Slowly push with your legs to return to standing. Do not use your hands to pull yourself to standing. Repeat __________ times. Complete this exercise __________ times a day. Exercise J: Wall Slides (Quadriceps) 1. Lean your back against a smooth wall or door while you walk your feet out 18-24 inches (46-61 cm) from it. 2. Place your feet hip-width apart. 3. Slowly slide down the wall or door until your knees bend __________ degrees. Keep your knees over your heels, not over your toes. Keep your knees in line with your hips. 4. Hold for __________ seconds. Repeat __________ times. Complete this exercise __________ times a day. Exercise K: Straight Leg Raises - Hip Abductors 1. Lie on your side with your left / right leg in the top position. Lie so your head, shoulder, knee, and hip line up. You may bend your bottom knee  to help you keep your balance. 2. Roll your hips slightly forward so your hips are stacked directly over each other and your left / right knee is facing forward. 3. Leading with your heel, lift your top leg 4-6 inches (10-15 cm). You should feel the muscles in your outer hip lifting. ? Do not let your foot drift forward. ? Do not let your knee roll toward the ceiling. 4. Hold this position for __________ seconds. 5. Slowly return your leg to the starting position. 6. Let your muscles relax completely after each repetition. Repeat __________ times. Complete this exercise __________ times a day. Exercise L: Straight Leg Raises - Hip Extensors 1. Lie on your abdomen on a firm surface. You can put a pillow under your hips if that is more comfortable. 2. Tense the muscles in your buttocks and lift your left / right leg about 4-6 inches (10-15 cm). Keep your knee straight as you lift your leg. 3. Hold this position for __________ seconds. 4. Slowly lower your leg to the starting position. 5. Let your leg relax completely after each repetition. Repeat __________ times. Complete this exercise __________ times a day. This information is not intended to replace advice given to you by your health care provider. Make sure you discuss any questions you have with your health care provider. Document Released: 02/28/2005 Document Revised: 01/09/2016 Document Reviewed: 02/20/2015 Elsevier Interactive Patient Education  2019 [employer]  

## 2018-07-22 ENCOUNTER — Other Ambulatory Visit: Payer: Self-pay | Admitting: Rheumatology

## 2018-07-22 NOTE — Telephone Encounter (Signed)
Last Visit: 05/21/18 Next visit: 11/19/18  Okay to refill per Dr. Estanislado Pandy

## 2018-08-14 DIAGNOSIS — F319 Bipolar disorder, unspecified: Secondary | ICD-10-CM | POA: Diagnosis not present

## 2018-11-07 DIAGNOSIS — H1045 Other chronic allergic conjunctivitis: Secondary | ICD-10-CM | POA: Diagnosis not present

## 2018-11-07 DIAGNOSIS — J310 Chronic rhinitis: Secondary | ICD-10-CM | POA: Diagnosis not present

## 2018-11-07 DIAGNOSIS — J4540 Moderate persistent asthma, uncomplicated: Secondary | ICD-10-CM | POA: Diagnosis not present

## 2018-11-07 NOTE — Progress Notes (Deleted)
Office Visit Note  Patient: Felicia Rivera             Date of Birth: [birth year]           MRN: [id number]             PCP: Donald Prose, MD Referring: Donald Prose, MD Visit Date: 11/19/2018 Occupation: @GUAROCC @  Subjective:  No chief complaint on file.   History of Present Illness: Felicia Rivera is a 48 y.o. female ***   Activities of Daily Living:  Patient reports morning stiffness for *** {minute/hour:19697}.   Patient {ACTIONS;DENIES/REPORTS:21021675::"Denies"} nocturnal pain.  Difficulty dressing/grooming: {ACTIONS;DENIES/REPORTS:21021675::"Denies"} Difficulty climbing stairs: {ACTIONS;DENIES/REPORTS:21021675::"Denies"} Difficulty getting out of chair: {ACTIONS;DENIES/REPORTS:21021675::"Denies"} Difficulty using hands for taps, buttons, cutlery, and/or writing: {ACTIONS;DENIES/REPORTS:21021675::"Denies"}  No Rheumatology ROS completed.   PMFS History:  Patient Active Problem List   Diagnosis Date Noted  . Other fatigue 07/25/2016  . Other insomnia 07/25/2016  . Bilateral sacroiliitis ([place]) 07/25/2016  . Trochanteric bursitis of both hips 07/25/2016  . Fibromyalgia 02/28/2016  . Ulnar neuropathy at elbow, left 02/28/2016  . Bipolar disorder ([place]) 02/28/2016  . Vitamin D deficiency 02/28/2016  . Migraine 02/28/2016  . RLS (restless legs syndrome) 02/28/2016  . IBS (irritable bowel syndrome) 02/28/2016  . GERD (gastroesophageal reflux disease) 02/28/2016    Past Medical History:  Diagnosis Date  . Bipolar 1 disorder ([place]) 02/28/2016  . Bipolar disorder ([place]) 02/28/2016  . Fibromyalgia 02/28/2016  . GERD (gastroesophageal reflux disease) 02/28/2016  . IBS (irritable bowel syndrome) 02/28/2016  . Migraine 02/28/2016  . RLS (restless legs syndrome) 02/28/2016  . Ulnar neuropathy at elbow, left 02/28/2016  . Vitamin D deficiency 02/28/2016    Family History  Problem Relation Age of Onset  . Diabetes Mother   . Glaucoma Mother   . Heart Problems Mother   .  Hypertension Mother   . Congestive Heart Failure Mother   . Rheum arthritis Mother        possibly  . Hypertension Father   . High Cholesterol Father   . Heart Problems Father    Past Surgical History:  Procedure Laterality Date  . ABDOMINAL HYSTERECTOMY    . dental work  04/16/2017   x2   Social History   Social History Narrative  . Not on file    There is no immunization history on file for this patient.   Objective: Vital Signs: There were no vitals taken for this visit.   Physical Exam   Musculoskeletal Exam: ***  CDAI Exam: CDAI Score: - Patient Global: -; Provider Global: - Swollen: -; Tender: - Joint Exam   No joint exam has been documented for this visit   There is currently no information documented on the homunculus. Go to the Rheumatology activity and complete the homunculus joint exam.  Investigation: No additional findings.  Imaging: No results found.  Recent Labs: Lab Results  Component Value Date   WBC 6.5 07/31/2016   HGB 13.2 07/31/2016   PLT 226 07/31/2016   NA 142 07/31/2016   K 5.4 (H) 07/31/2016   CL 110 07/31/2016   CO2 19 (L) 07/31/2016   GLUCOSE 105 (H) 07/31/2016   BUN 9 07/31/2016   CREATININE 1.42 (H) 07/31/2016   BILITOT 0.4 07/31/2016   ALKPHOS 47 07/31/2016   AST 19 07/31/2016   ALT 10 07/31/2016   PROT 7.2 07/31/2016   ALBUMIN 4.3 07/31/2016   CALCIUM 10.2 07/31/2016   GFRAA 51 (L) 07/31/2016    Speciality Comments: No  specialty comments available.  Procedures:  No procedures performed Allergies: Cogentin [benztropine], Hydrocodone, Lyrica [pregabalin], and Omnicef [cefdinir]   Assessment / Plan:     Visit Diagnoses: No diagnosis found.  Orders: No orders of the defined types were placed in this encounter.  No orders of the defined types were placed in this encounter.   Face-to-face time spent with patient was *** minutes. Greater than 50% of time was spent in counseling and coordination of care.   Follow-Up Instructions: No follow-ups on file.   [name], PA-C  Note - This record has been created using Dragon software.  Chart creation errors have been sought, but may not always  have been located. Such creation errors do not reflect on  the standard of medical care.

## 2018-11-13 DIAGNOSIS — F319 Bipolar disorder, unspecified: Secondary | ICD-10-CM | POA: Diagnosis not present

## 2018-11-19 ENCOUNTER — Ambulatory Visit: Payer: Medicare Other | Admitting: Physician Assistant

## 2018-11-19 NOTE — Progress Notes (Signed)
Office Visit Note  Patient: Felicia Rivera             Date of Birth: Aug 28, 1970           MRN: [id number]             PCP: Donald Prose, MD Referring: Donald Prose, MD Visit Date: 11/27/2018 Occupation: @GUAROCC @  Subjective:  Generalized pain   History of Present Illness: Felicia Rivera is a 48 y.o. female with history of fibromyalgia and osteoarthritis.  She has been having more frequent and severe fibromyalgia flares.  She has not had a massage or acupuncture since February 2020 due to the pandemic.  She continues to take Topamax 25 mg 2 tablets by mouth at bedtime.  She has cut back on the frequency of taking baclofen and is only taking it as needed for muscle spasms.  She continues to use her TENS unit on a regular basis but does any new electrodes.  She is having trapezius muscle tension and muscle tenderness bilaterally.  She has bilateral trochanteric bursitis.  She has been performing home exercises.  She continues to have chronic fatigue despite sleeping about 10 hours per night  Activities of Daily Living:  Patient reports joint stiffness all day  Patient Reports nocturnal pain.  Difficulty dressing/grooming: Denies Difficulty climbing stairs: Denies Difficulty getting out of chair: Denies Difficulty using hands for taps, buttons, cutlery, and/or writing: Denies  Review of Systems  Constitutional: Positive for fatigue.  HENT: Positive for mouth dryness. Negative for mouth sores and nose dryness.   Eyes: Negative for pain, visual disturbance and dryness.  Respiratory: Negative for cough, hemoptysis, shortness of breath and difficulty breathing.   Cardiovascular: Positive for palpitations. Negative for chest pain, hypertension and swelling in legs/feet.  Gastrointestinal: Positive for constipation. Negative for blood in stool and diarrhea.  Endocrine: Negative for increased urination.  Genitourinary: Negative for painful urination.  Musculoskeletal: Positive for myalgias,  morning stiffness, muscle tenderness and myalgias. Negative for arthralgias, joint pain, joint swelling and muscle weakness.  Skin: Negative for color change, pallor, rash, hair loss, nodules/bumps, skin tightness, ulcers and sensitivity to sunlight.  Allergic/Immunologic: Negative for susceptible to infections.  Neurological: Negative for dizziness, numbness, headaches and weakness.  Hematological: Negative for swollen glands.  Psychiatric/Behavioral: Positive for depressed mood and sleep disturbance. The patient is nervous/anxious.     PMFS History:  Patient Active Problem List   Diagnosis Date Noted   Other fatigue 07/25/2016   Other insomnia 07/25/2016   Bilateral sacroiliitis ([place]) 07/25/2016   Trochanteric bursitis of both hips 07/25/2016   Fibromyalgia 02/28/2016   Ulnar neuropathy at elbow, left 02/28/2016   Bipolar disorder ([place]) 02/28/2016   Vitamin D deficiency 02/28/2016   Migraine 02/28/2016   RLS (restless legs syndrome) 02/28/2016   IBS (irritable bowel syndrome) 02/28/2016   GERD (gastroesophageal reflux disease) 02/28/2016    Past Medical History:  Diagnosis Date   Bipolar 1 disorder ([place]) 02/28/2016   Bipolar disorder ([place]) 02/28/2016   Fibromyalgia 02/28/2016   GERD (gastroesophageal reflux disease) 02/28/2016   IBS (irritable bowel syndrome) 02/28/2016   Migraine 02/28/2016   RLS (restless legs syndrome) 02/28/2016   Ulnar neuropathy at elbow, left 02/28/2016   Vitamin D deficiency 02/28/2016    Family History  Problem Relation Age of Onset   Diabetes Mother    Glaucoma Mother    Heart Problems Mother    Hypertension Mother    Congestive Heart Failure Mother    Rheum arthritis  Mother        possibly   Hypertension Father    High Cholesterol Father    Heart Problems Father    Past Surgical History:  Procedure Laterality Date   ABDOMINAL HYSTERECTOMY     dental work  04/16/2017   x2   Social History   Social  History Narrative   Not on file    There is no immunization history on file for this patient.   Objective: Vital Signs: BP 102/70 (BP Location: Left Arm, Patient Position: Sitting, Cuff Size: Small)    Pulse (!) 105    Resp 12    Wt 109 lb 6.4 oz (49.6 kg)    BMI 19.23 kg/m    Physical Exam Vitals signs and nursing note reviewed.  Constitutional:      Appearance: She is well-developed.  HENT:     Head: Normocephalic and atraumatic.  Eyes:     Conjunctiva/sclera: Conjunctivae normal.  Neck:     Musculoskeletal: Normal range of motion.  Cardiovascular:     Rate and Rhythm: Normal rate and regular rhythm.     Heart sounds: Normal heart sounds.  Pulmonary:     Effort: Pulmonary effort is normal.     Breath sounds: Normal breath sounds.  Abdominal:     General: Bowel sounds are normal.     Palpations: Abdomen is soft.  Lymphadenopathy:     Cervical: No cervical adenopathy.  Skin:    General: Skin is warm and dry.     Capillary Refill: Capillary refill takes less than 2 seconds.  Neurological:     Mental Status: She is alert and oriented to person, place, and time.  Psychiatric:        Behavior: Behavior normal.      Musculoskeletal Exam: Generalized hyperalgesia and positive tender points.  She has limited range of motion of the C-spine.  Trapezius muscle tension and muscle tenderness bilaterally.  Thoracic and lumbar spine good range of motion.  Shoulder exam elbow joints, wrist joints, MCPs, PIPs, DIPs good range of motion with no synovitis.  She has complete fist formation bilaterally.  Hip joints, knee joints, ankle joints, MTPs, PIPs, DIPs good range of motion no synovitis.  No warmth or effusion bilateral knee joints.  No tender swelling of ankle joints.  Tenderness over bilateral trochanteric bursa.  CDAI Exam: CDAI Score: -- Patient Global: --; Provider Global: -- Swollen: --; Tender: -- Joint Exam   No joint exam has been documented for this visit   There is  currently no information documented on the homunculus. Go to the Rheumatology activity and complete the homunculus joint exam.  Investigation: No additional findings.  Imaging: No results found.  Recent Labs: Lab Results  Component Value Date   WBC 6.5 07/31/2016   HGB 13.2 07/31/2016   PLT 226 07/31/2016   NA 142 07/31/2016   K 5.4 (H) 07/31/2016   CL 110 07/31/2016   CO2 19 (L) 07/31/2016   GLUCOSE 105 (H) 07/31/2016   BUN 9 07/31/2016   CREATININE 1.42 (H) 07/31/2016   BILITOT 0.4 07/31/2016   ALKPHOS 47 07/31/2016   AST 19 07/31/2016   ALT 10 07/31/2016   PROT 7.2 07/31/2016   ALBUMIN 4.3 07/31/2016   CALCIUM 10.2 07/31/2016   GFRAA 51 (L) 07/31/2016    Speciality Comments: No specialty comments available.  Procedures:  No procedures performed Allergies: Cogentin [benztropine], Hydrocodone, Lyrica [pregabalin], and Omnicef [cefdinir]   Assessment / Plan:  Visit Diagnoses: Fibromyalgia - She has generalized hyperalgesia and positive tender points on exam.  She is been having more frequent and severe fibromyalgia flares over the past several months.  She has not had a massage or gone for acupuncture since February 2020 due to the pandemic.  She continues to use her TENS unit on a regular basis.  She also uses Biofreeze topically as needed for pain relief.  She has reduced the frequency of taking baclofen and is only been taking it as needed.  She continues to take Topamax 25 mg 3 tablets by mouth at bedtime.  She has not had any difficulty sleeping recently.  She is been sleeping about 10 hours per night.  She continues to have chronic fatigue despite sleeping well.  She was encouraged to stay active and exercise on a regular basis.  She continues to have bilateral trochanter bursitis and she was advised to perform home exercises.  She does not need any refills at this time.  She will follow-up in the office in 6 months.  Primary osteoarthritis of both hands - She has  stiffness in both hands but no tenderness or synovitis noted. She has complete fist formation bilaterally.  Joint protection and muscle strengthening were discussed.   Trochanteric bursitis of both hips -She has tenderness over bilateral trochanteric bursa.  She was encouraged to continue to perform home exercises.   Other insomnia - She has not had any difficulty sleeping recently.  She has been sleeping about 10 hours per night.  She continues to take Topamax 25 mg 3 tablets by mouth at bedtime.  Other fatigue - She has chronic fatigue despite sleeping well.  She was encouraged to stay active and exercise on a regular basis.   Ulnar neuropathy at elbow, left - She is symptomatic intermittently.   RLS (restless legs syndrome) - She experiences RLS at night occasionally.   History of vitamin D deficiency  History of bipolar disorder - She has been seeing her psychiatrist every 3 weeks.  History of gastroesophageal reflux (GERD)   History of IBS  History of migraine   Orders: No orders of the defined types were placed in this encounter.  No orders of the defined types were placed in this encounter.    Follow-Up Instructions: Return in about 6 months (around 05/30/2019) for Fibromyalgia.   [name], PA-C  Note - This record has been created using Dragon software.  Chart creation errors have been sought, but may not always  have been located. Such creation errors do not reflect on  the standard of medical care.

## 2018-11-27 ENCOUNTER — Other Ambulatory Visit: Payer: Self-pay

## 2018-11-27 ENCOUNTER — Encounter: Payer: Self-pay | Admitting: Physician Assistant

## 2018-11-27 ENCOUNTER — Ambulatory Visit (INDEPENDENT_AMBULATORY_CARE_PROVIDER_SITE_OTHER): Payer: Medicare Other | Admitting: Physician Assistant

## 2018-11-27 VITALS — BP 102/70 | HR 105 | Resp 12 | Wt 109.4 lb

## 2018-11-27 DIAGNOSIS — M19042 Primary osteoarthritis, left hand: Secondary | ICD-10-CM | POA: Diagnosis not present

## 2018-11-27 DIAGNOSIS — R5383 Other fatigue: Secondary | ICD-10-CM

## 2018-11-27 DIAGNOSIS — G2581 Restless legs syndrome: Secondary | ICD-10-CM | POA: Diagnosis not present

## 2018-11-27 DIAGNOSIS — G5622 Lesion of ulnar nerve, left upper limb: Secondary | ICD-10-CM

## 2018-11-27 DIAGNOSIS — M7061 Trochanteric bursitis, right hip: Secondary | ICD-10-CM

## 2018-11-27 DIAGNOSIS — M7062 Trochanteric bursitis, left hip: Secondary | ICD-10-CM

## 2018-11-27 DIAGNOSIS — G4709 Other insomnia: Secondary | ICD-10-CM

## 2018-11-27 DIAGNOSIS — Z8639 Personal history of other endocrine, nutritional and metabolic disease: Secondary | ICD-10-CM

## 2018-11-27 DIAGNOSIS — Z8719 Personal history of other diseases of the digestive system: Secondary | ICD-10-CM | POA: Diagnosis not present

## 2018-11-27 DIAGNOSIS — M19041 Primary osteoarthritis, right hand: Secondary | ICD-10-CM

## 2018-11-27 DIAGNOSIS — M797 Fibromyalgia: Secondary | ICD-10-CM | POA: Diagnosis not present

## 2018-11-27 DIAGNOSIS — Z8669 Personal history of other diseases of the nervous system and sense organs: Secondary | ICD-10-CM

## 2018-11-27 DIAGNOSIS — Z8659 Personal history of other mental and behavioral disorders: Secondary | ICD-10-CM

## 2019-01-06 ENCOUNTER — Other Ambulatory Visit: Payer: Self-pay | Admitting: Rheumatology

## 2019-01-06 NOTE — Telephone Encounter (Signed)
Last Visit: 11/27/18 Next Visit: 05/28/19  Okay to refill per Dr. Estanislado Pandy

## 2019-02-11 DIAGNOSIS — Z124 Encounter for screening for malignant neoplasm of cervix: Secondary | ICD-10-CM | POA: Diagnosis not present

## 2019-03-03 ENCOUNTER — Other Ambulatory Visit: Payer: Self-pay

## 2019-03-03 DIAGNOSIS — Z20828 Contact with and (suspected) exposure to other viral communicable diseases: Secondary | ICD-10-CM | POA: Diagnosis not present

## 2019-03-03 DIAGNOSIS — Z20822 Contact with and (suspected) exposure to covid-19: Secondary | ICD-10-CM

## 2019-03-04 LAB — NOVEL CORONAVIRUS, NAA: SARS-CoV-2, NAA: NOT DETECTED

## 2019-03-06 IMAGING — MG DIGITAL SCREENING BILATERAL MAMMOGRAM WITH TOMO AND CAD
8 series · 9 of 24 positions shown · non-contrast
Comparison: Previous exam(s).

CLINICAL DATA: Screening.

EXAM:
DIGITAL SCREENING BILATERAL MAMMOGRAM WITH TOMO AND CAD

[L MLO synth-2D]
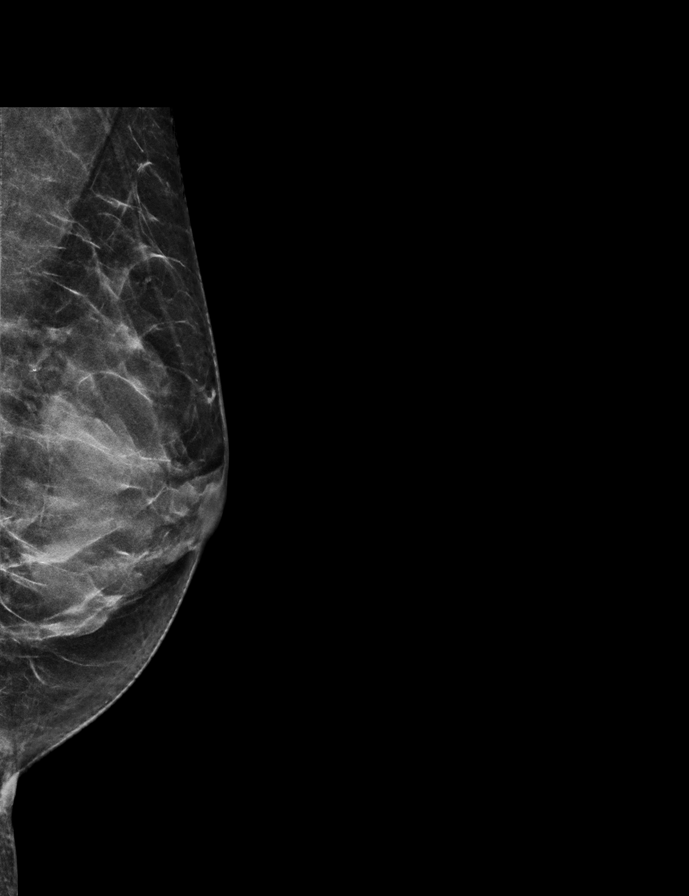

[R MLO synth-2D]
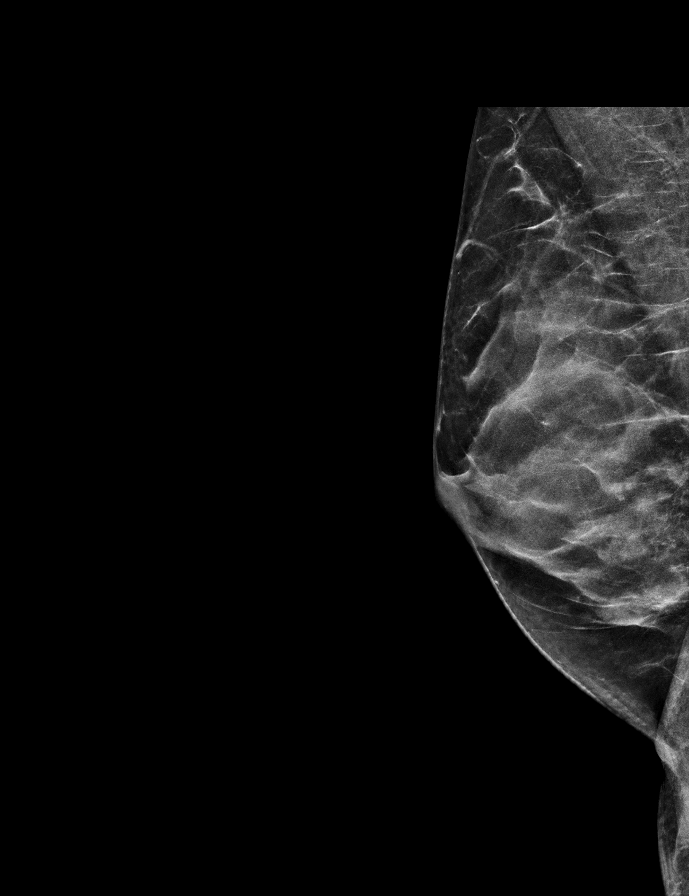

[L CC synth-2D]
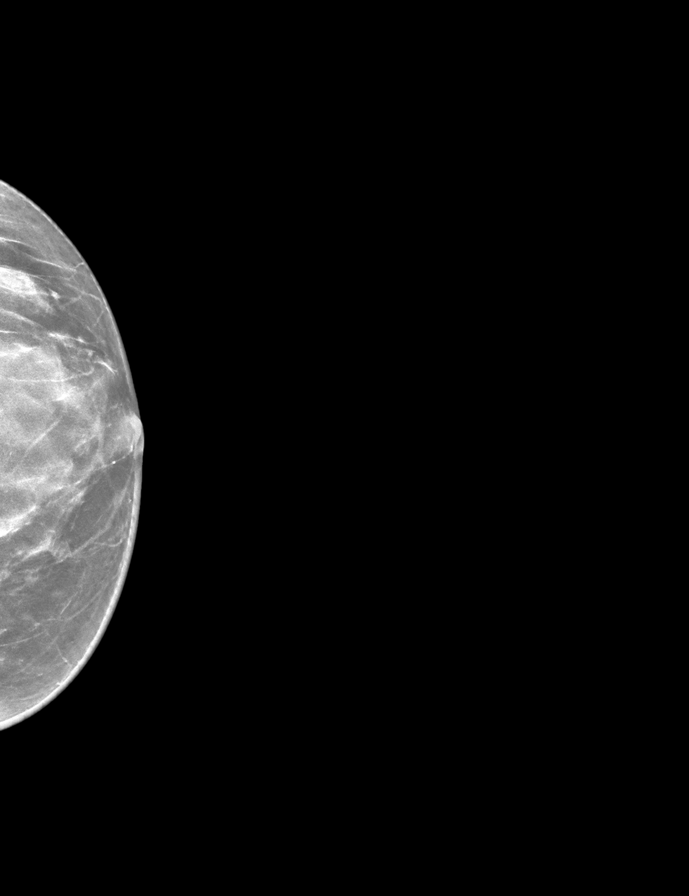

[R CC synth-2D]
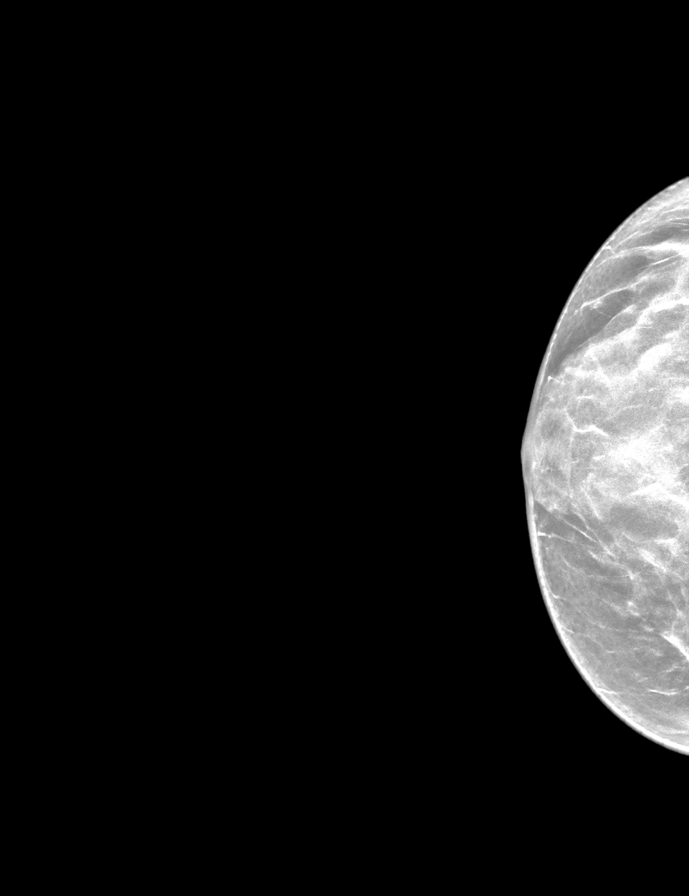

[L CC tomo · 2 of 48 frames shown]
[frame 16/48]
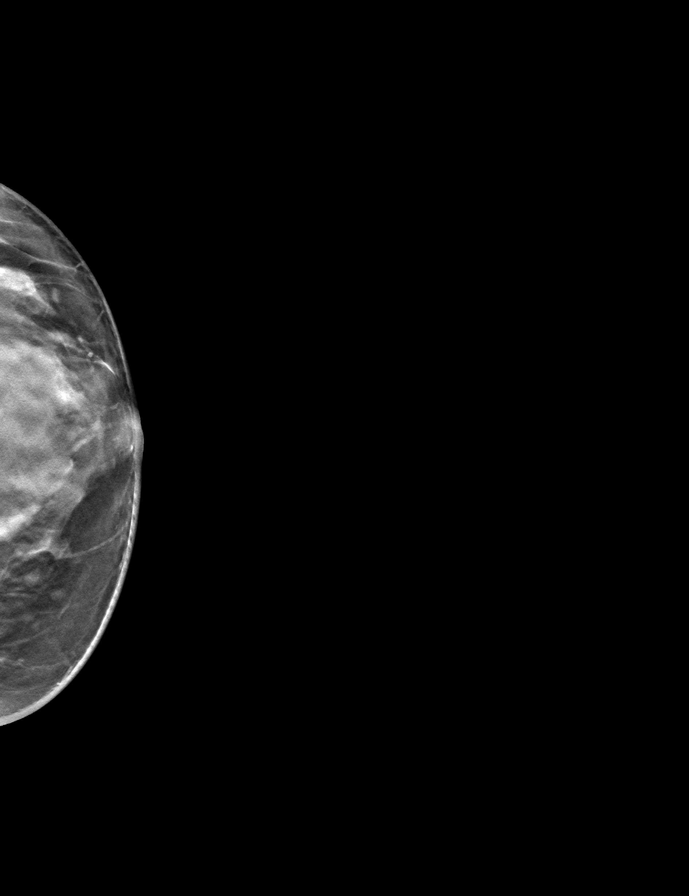
[frame 25/48]
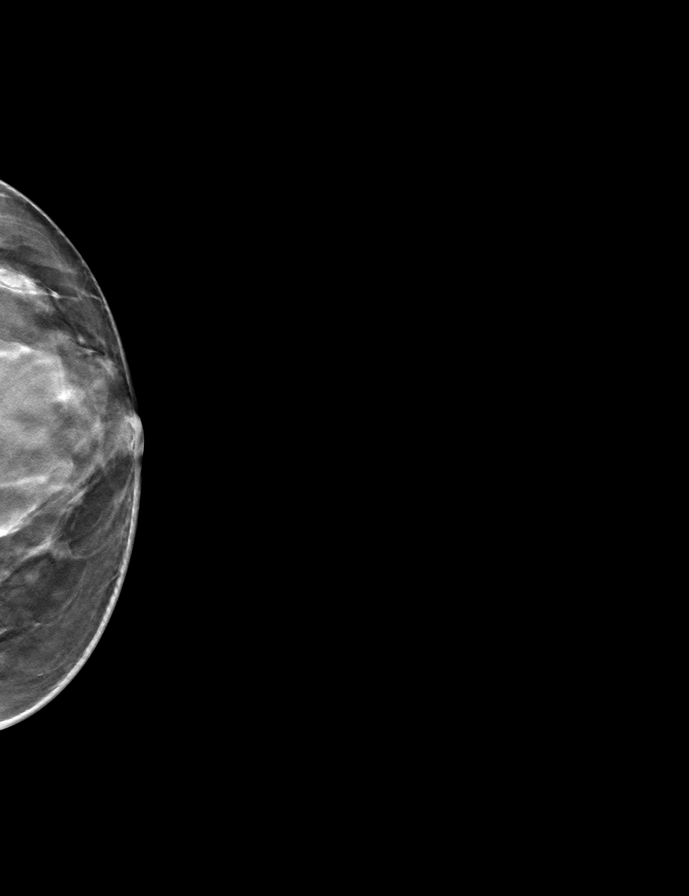

[R CC tomo · tomo slice 23/46.0]
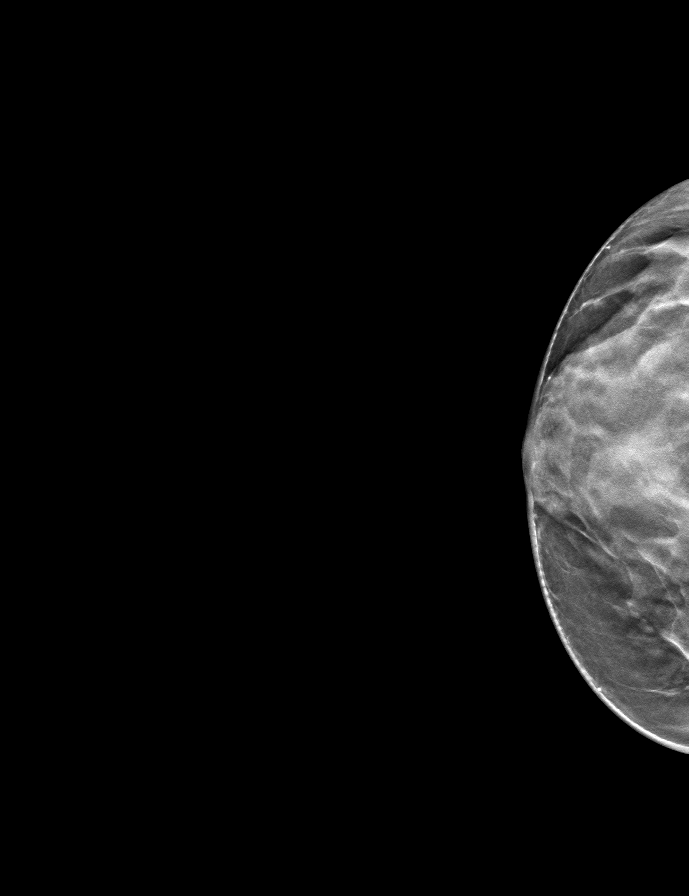

[L MLO tomo · tomo slice 27/52.0]
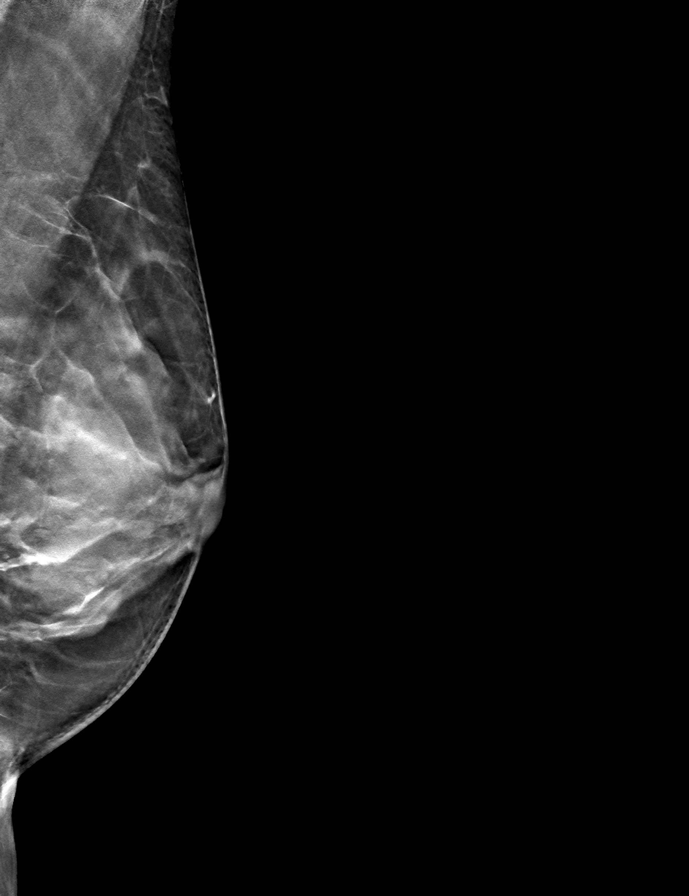

[R MLO tomo · tomo slice 27/52.0]
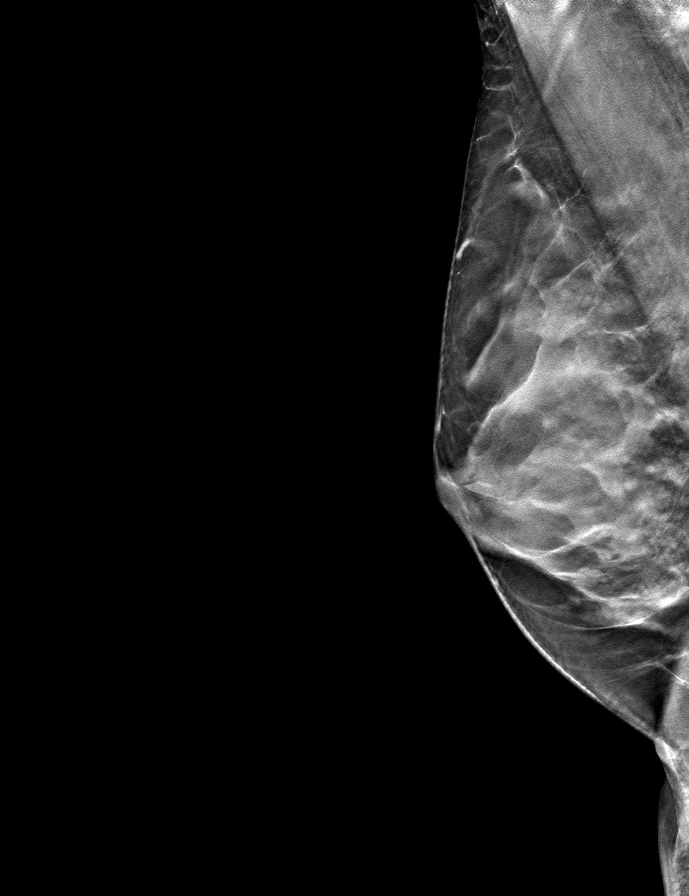

[9 of 24 positions shown; findings below may reference images not displayed]

ACR Breast Density Category c: The breast tissue is heterogeneously
dense, which may obscure small masses.
FINDINGS: There are no findings suspicious for malignancy. Images were
processed with CAD.
IMPRESSION: No mammographic evidence of malignancy. A result letter of this
screening mammogram will be mailed directly to the patient.

RECOMMENDATION:
Screening mammogram in one year. (Code:[id number]-U-LHB)

BI-RADS CATEGORY  1: Negative.

## 2019-03-09 DIAGNOSIS — J4540 Moderate persistent asthma, uncomplicated: Secondary | ICD-10-CM | POA: Diagnosis not present

## 2019-03-09 DIAGNOSIS — H1045 Other chronic allergic conjunctivitis: Secondary | ICD-10-CM | POA: Diagnosis not present

## 2019-03-09 DIAGNOSIS — J310 Chronic rhinitis: Secondary | ICD-10-CM | POA: Diagnosis not present

## 2019-03-31 DIAGNOSIS — Z1231 Encounter for screening mammogram for malignant neoplasm of breast: Secondary | ICD-10-CM | POA: Diagnosis not present

## 2019-04-16 ENCOUNTER — Other Ambulatory Visit: Payer: Self-pay | Admitting: Rheumatology

## 2019-04-16 NOTE — Telephone Encounter (Signed)
Last Visit: 11/27/2018 Next Visit: 05/28/2019  Okay to refill per Dr. Estanislado Pandy.

## 2019-05-12 ENCOUNTER — Ambulatory Visit: Payer: Medicare Other | Attending: Internal Medicine

## 2019-05-12 DIAGNOSIS — Z20822 Contact with and (suspected) exposure to covid-19: Secondary | ICD-10-CM | POA: Diagnosis not present

## 2019-05-14 LAB — NOVEL CORONAVIRUS, NAA: SARS-CoV-2, NAA: NOT DETECTED

## 2019-05-19 ENCOUNTER — Other Ambulatory Visit: Payer: Self-pay | Admitting: Rheumatology

## 2019-05-20 NOTE — Progress Notes (Signed)
Virtual Visit via Telephone Note  I connected with Felicia Rivera on 05/28/19 at  9:45 AM EST by telephone and verified that I am speaking with the correct person using two identifiers.  Location: Patient: Home  Provider: Clinic  This service was conducted via virtual visit.  The patient was located at home. I was located in my office.  Consent was obtained prior to the virtual visit and is aware of possible charges through their insurance for this visit.  The patient is an established patient.  Dr. Estanislado Pandy, MD conducted the virtual visit and Hazel Sams, PA-C acted as scribe during the service.  Office staff helped with scheduling follow up visits after the service was conducted.     I discussed the limitations, risks, security and privacy concerns of performing an evaluation and management service by telephone and the availability of in person appointments. I also discussed with the patient that there may be a patient responsible charge related to this service. The patient expressed understanding and agreed to proceed.  CC: Generalized pain  History of Present Illness: Felicia Rivera is a 49 y.o. female with history of fibromyalgia and osteoarthritis.  She has been experiencing increased stiffness in both hands recently.  She denies any joint swelling.  She states her left elbow neuropathy has resolved. She has generalized muscle aches and muscle tenderness, which is exacerbated by cooler weather temperatures. She has trapezius muscle tension and tenderness bilaterally.  She takes baclofen as needed. She denies any trochanteric bursitis discomfort at this time. She has been trying to perform stretching exercises on a regular basis.  She states she has been more sedentary recently, so her fatigue has been more significant.  She is only taking 1 dance class per week due to the concern for covid-19.  She has not scheduled acupuncture recently but continues to use the TENS unit as needed.    Review  of Systems  Constitutional: Positive for malaise/fatigue. Negative for fever.  Eyes: Negative for photophobia, pain, discharge and redness.  Respiratory: Negative for cough and wheezing.   Cardiovascular: Negative for chest pain, palpitations and leg swelling.  Gastrointestinal: Positive for constipation. Negative for blood in stool and diarrhea.  Genitourinary: Negative for dysuria and frequency.  Musculoskeletal: Positive for joint pain, myalgias and neck pain. Negative for back pain.  Skin: Negative for rash.  Neurological: Negative for dizziness and headaches.  Psychiatric/Behavioral: Negative for depression. The patient is not nervous/anxious and does not have insomnia.      Observations/Objective:  Physical Exam  Constitutional: She is oriented to person, place, and time.  Neurological: She is alert and oriented to person, place, and time.  Psychiatric: Mood, memory, affect and judgment normal.    Patient reports morning stiffness for 30 minutes.   Patient denies nocturnal pain.  Difficulty dressing/grooming: Denies Difficulty climbing stairs: Reports Difficulty getting out of chair: Denies Difficulty using hands for taps, buttons, cutlery, and/or writing: Reports   Assessment and Plan: Visit Diagnoses: Fibromyalgia - She has chronic generalized muscle aches and muscle tenderness due to fibromyalgia.  Her symptoms have been exacerbated by cooler weather temperatures.  She is currently experiencing trapezius muscle tension and tenderness bilaterally.  She uses a TENS unit as needed and continues to take baclofen as needed for muscle spasms.  She has been unable to go for acupuncture due to the concern for Covid-19. She has noticed increased myalgias and fatigue since she has been more sedentary during the pandemic. She is only going  1 dance class per week.  We discussed the importance of regular exercise and good sleep hygiene.  She will follow up in 5 months.   Primary  osteoarthritis of both hands: She has noticed increased stiffness in both hands recently but no inflammation.  Joint protection and muscle strengthening were discussed.    Trochanteric bursitis of both hips: Her discomfort has improved. She has been performing stretching exercises on a regular basis, which has made the discomfort manageable.   Other insomnia - She continues to take Topamax 25 mg 3 tablets by mouth at bedtime.  She has been sleeping well overall.    Other fatigue: Her level of fatigue has been worsening recently since she has been more sedentary.  She has only been going to 1 dance class per week due to the concern for covid-19.    Ulnar neuropathy at elbow, left: Resolved.  RLS (restless legs syndrome): She has intermittent nocturnal symptoms.    Other medical conditions are listed as follows:   History of vitamin D deficiency  History of bipolar disorder   History of gastroesophageal reflux (GERD)   History of IBS  History of migraine   Follow Up Instructions: She will follow up in 5 months   I discussed the assessment and treatment plan with the patient. The patient was provided an opportunity to ask questions and all were answered. The patient agreed with the plan and demonstrated an understanding of the instructions.   The patient was advised to call back or seek an in-person evaluation if the symptoms worsen or if the condition fails to improve as anticipated.  I provided 15 minutes of non-face-to-face time during this encounter.  Bo Merino, MD   Scribed by-  Hazel Sams, PA-C

## 2019-05-28 ENCOUNTER — Encounter: Payer: Self-pay | Admitting: Rheumatology

## 2019-05-28 ENCOUNTER — Telehealth (INDEPENDENT_AMBULATORY_CARE_PROVIDER_SITE_OTHER): Payer: Medicare Other | Admitting: Rheumatology

## 2019-05-28 ENCOUNTER — Other Ambulatory Visit: Payer: Self-pay

## 2019-05-28 DIAGNOSIS — R5383 Other fatigue: Secondary | ICD-10-CM | POA: Diagnosis not present

## 2019-05-28 DIAGNOSIS — Z8639 Personal history of other endocrine, nutritional and metabolic disease: Secondary | ICD-10-CM | POA: Diagnosis not present

## 2019-05-28 DIAGNOSIS — M797 Fibromyalgia: Secondary | ICD-10-CM | POA: Diagnosis not present

## 2019-05-28 DIAGNOSIS — M7061 Trochanteric bursitis, right hip: Secondary | ICD-10-CM | POA: Diagnosis not present

## 2019-05-28 DIAGNOSIS — M19041 Primary osteoarthritis, right hand: Secondary | ICD-10-CM | POA: Diagnosis not present

## 2019-05-28 DIAGNOSIS — G5622 Lesion of ulnar nerve, left upper limb: Secondary | ICD-10-CM | POA: Diagnosis not present

## 2019-05-28 DIAGNOSIS — Z8659 Personal history of other mental and behavioral disorders: Secondary | ICD-10-CM

## 2019-05-28 DIAGNOSIS — M19042 Primary osteoarthritis, left hand: Secondary | ICD-10-CM | POA: Diagnosis not present

## 2019-05-28 DIAGNOSIS — Z8669 Personal history of other diseases of the nervous system and sense organs: Secondary | ICD-10-CM | POA: Diagnosis not present

## 2019-05-28 DIAGNOSIS — Z8719 Personal history of other diseases of the digestive system: Secondary | ICD-10-CM | POA: Diagnosis not present

## 2019-05-28 DIAGNOSIS — G2581 Restless legs syndrome: Secondary | ICD-10-CM | POA: Diagnosis not present

## 2019-05-28 DIAGNOSIS — M7062 Trochanteric bursitis, left hip: Secondary | ICD-10-CM

## 2019-05-28 DIAGNOSIS — G4709 Other insomnia: Secondary | ICD-10-CM

## 2019-06-16 ENCOUNTER — Other Ambulatory Visit: Payer: Self-pay | Admitting: Rheumatology

## 2019-06-16 NOTE — Telephone Encounter (Signed)
Last Visit: 05/28/2019 telemedicine  Next Visit: 10/27/2019  Okay to refill per Dr. Estanislado Pandy.

## 2019-06-25 ENCOUNTER — Telehealth: Payer: Self-pay | Admitting: Rheumatology

## 2019-06-25 NOTE — Telephone Encounter (Signed)
Patient scheduled for 06/29/19 at 10:20 am.

## 2019-06-25 NOTE — Telephone Encounter (Signed)
Patient will need an office visit to evaluate her thumb before making any decisions.  Please schedule an appointment.

## 2019-06-25 NOTE — Telephone Encounter (Signed)
Patient states she is having trouble with her right thumb. Patient states she is having severe pain when she moves it. Patient states it comes and goes. Patient states the pain is worse when she bends it. Patient states their is mild swelling. Patient states the palm of her her hand is swollen. Patient states she has been using heat which is providing some relief. Patient states she has tried some tylenol and has not had relief with it. Patient has put it in a splint to see if that would help. Patient states it help keep her from moving it which helps some. Patient denies any injury. Patient would like to know what other suggestions you may have. Please advise.

## 2019-06-25 NOTE — Telephone Encounter (Signed)
Patient called stating approximately 2 weeks ago her left thumb became painful.  Patient states she originally though she sprained it and wore a brace which didn't help.  Patient states it is painful to touch and difficult for her to move.  Patient states there is a little redness and the bottom knuckle is a little swollen.  Patient states she has been taking Tylenol but it is not relieving her pain.  Patient states she is not sure if she needs to make an appointment or if there is something Dr. Estanislado Pandy would recommend.

## 2019-06-26 NOTE — Progress Notes (Deleted)
Office Visit Note  Patient: Felicia Rivera             Date of Birth: Dec 06, 1970           MRN: [id number]             PCP: Donald Prose, MD Referring: Donald Prose, MD Visit Date: 06/30/2019 Occupation: @GUAROCC @  Subjective:  No chief complaint on file.   History of Present Illness: Felicia Rivera is a 49 y.o. female ***   Activities of Daily Living:  Patient reports morning stiffness for *** {minute/hour:19697}.   Patient {ACTIONS;DENIES/REPORTS:21021675::"Denies"} nocturnal pain.  Difficulty dressing/grooming: {ACTIONS;DENIES/REPORTS:21021675::"Denies"} Difficulty climbing stairs: {ACTIONS;DENIES/REPORTS:21021675::"Denies"} Difficulty getting out of chair: {ACTIONS;DENIES/REPORTS:21021675::"Denies"} Difficulty using hands for taps, buttons, cutlery, and/or writing: {ACTIONS;DENIES/REPORTS:21021675::"Denies"}  No Rheumatology ROS completed.   PMFS History:  Patient Active Problem List   Diagnosis Date Noted  . Other fatigue 07/25/2016  . Other insomnia 07/25/2016  . Bilateral sacroiliitis ([place]) 07/25/2016  . Trochanteric bursitis of both hips 07/25/2016  . Fibromyalgia 02/28/2016  . Ulnar neuropathy at elbow, left 02/28/2016  . Bipolar disorder ([place]) 02/28/2016  . Vitamin D deficiency 02/28/2016  . Migraine 02/28/2016  . RLS (restless legs syndrome) 02/28/2016  . IBS (irritable bowel syndrome) 02/28/2016  . GERD (gastroesophageal reflux disease) 02/28/2016    Past Medical History:  Diagnosis Date  . Bipolar 1 disorder ([place]) 02/28/2016  . Bipolar disorder ([place]) 02/28/2016  . Bursitis   . Fibromyalgia 02/28/2016  . GERD (gastroesophageal reflux disease) 02/28/2016  . IBS (irritable bowel syndrome) 02/28/2016  . Migraine 02/28/2016  . RLS (restless legs syndrome) 02/28/2016  . Ulnar neuropathy at elbow, left 02/28/2016  . Vitamin D deficiency 02/28/2016    Family History  Problem Relation Age of Onset  . Diabetes Mother   . Glaucoma Mother   . Heart Problems  Mother   . Hypertension Mother   . Congestive Heart Failure Mother   . Rheum arthritis Mother        possibly  . Hypertension Father   . High Cholesterol Father   . Heart Problems Father    Past Surgical History:  Procedure Laterality Date  . ABDOMINAL HYSTERECTOMY    . dental work  04/16/2017   x2   Social History   Social History Narrative  . Not on file    There is no immunization history on file for this patient.   Objective: Vital Signs: There were no vitals taken for this visit.   Physical Exam   Musculoskeletal Exam: ***  CDAI Exam: CDAI Score: - Patient Global: -; Provider Global: - Swollen: -; Tender: - Joint Exam 06/30/2019   No joint exam has been documented for this visit   There is currently no information documented on the homunculus. Go to the Rheumatology activity and complete the homunculus joint exam.  Investigation: No additional findings.  Imaging: No results found.  Recent Labs: Lab Results  Component Value Date   WBC 6.5 07/31/2016   HGB 13.2 07/31/2016   PLT 226 07/31/2016   NA 142 07/31/2016   K 5.4 (H) 07/31/2016   CL 110 07/31/2016   CO2 19 (L) 07/31/2016   GLUCOSE 105 (H) 07/31/2016   BUN 9 07/31/2016   CREATININE 1.42 (H) 07/31/2016   BILITOT 0.4 07/31/2016   ALKPHOS 47 07/31/2016   AST 19 07/31/2016   ALT 10 07/31/2016   PROT 7.2 07/31/2016   ALBUMIN 4.3 07/31/2016   CALCIUM 10.2 07/31/2016   GFRAA 51 (L) 07/31/2016  Speciality Comments: No specialty comments available.  Procedures:  No procedures performed Allergies: Cogentin [benztropine], Hydrocodone, Lyrica [pregabalin], and Omnicef [cefdinir]   Assessment / Plan:     Visit Diagnoses: No diagnosis found.  Orders: No orders of the defined types were placed in this encounter.  No orders of the defined types were placed in this encounter.   Face-to-face time spent with patient was *** minutes. Greater than 50% of time was spent in counseling and  coordination of care.  Follow-Up Instructions: No follow-ups on file.   [name], PA-C  Note - This record has been created using Dragon software.  Chart creation errors have been sought, but may not always  have been located. Such creation errors do not reflect on  the standard of medical care.

## 2019-06-29 ENCOUNTER — Encounter: Payer: Self-pay | Admitting: Physician Assistant

## 2019-06-29 ENCOUNTER — Ambulatory Visit (INDEPENDENT_AMBULATORY_CARE_PROVIDER_SITE_OTHER): Payer: Medicare Other | Admitting: Rheumatology

## 2019-06-29 ENCOUNTER — Ambulatory Visit: Payer: Self-pay

## 2019-06-29 ENCOUNTER — Other Ambulatory Visit: Payer: Self-pay

## 2019-06-29 VITALS — BP 103/64 | HR 118 | Resp 12 | Ht 64.0 in | Wt 109.6 lb

## 2019-06-29 DIAGNOSIS — Z8669 Personal history of other diseases of the nervous system and sense organs: Secondary | ICD-10-CM

## 2019-06-29 DIAGNOSIS — M19041 Primary osteoarthritis, right hand: Secondary | ICD-10-CM

## 2019-06-29 DIAGNOSIS — G4709 Other insomnia: Secondary | ICD-10-CM

## 2019-06-29 DIAGNOSIS — M7062 Trochanteric bursitis, left hip: Secondary | ICD-10-CM

## 2019-06-29 DIAGNOSIS — M79644 Pain in right finger(s): Secondary | ICD-10-CM | POA: Diagnosis not present

## 2019-06-29 DIAGNOSIS — Z8719 Personal history of other diseases of the digestive system: Secondary | ICD-10-CM

## 2019-06-29 DIAGNOSIS — M7061 Trochanteric bursitis, right hip: Secondary | ICD-10-CM

## 2019-06-29 DIAGNOSIS — Z8639 Personal history of other endocrine, nutritional and metabolic disease: Secondary | ICD-10-CM | POA: Diagnosis not present

## 2019-06-29 DIAGNOSIS — Z8659 Personal history of other mental and behavioral disorders: Secondary | ICD-10-CM | POA: Diagnosis not present

## 2019-06-29 DIAGNOSIS — M19042 Primary osteoarthritis, left hand: Secondary | ICD-10-CM | POA: Diagnosis not present

## 2019-06-29 DIAGNOSIS — M797 Fibromyalgia: Secondary | ICD-10-CM

## 2019-06-29 DIAGNOSIS — R5383 Other fatigue: Secondary | ICD-10-CM | POA: Diagnosis not present

## 2019-06-29 DIAGNOSIS — G2581 Restless legs syndrome: Secondary | ICD-10-CM | POA: Diagnosis not present

## 2019-06-29 NOTE — Progress Notes (Signed)
Office Visit Note  Patient: Felicia Rivera             Date of Birth: January 29, 1971           MRN: [id number]             PCP: Donald Prose, MD Referring: Donald Prose, MD Visit Date: 06/29/2019 Occupation: @GUAROCC @  Subjective:  Other (right thumb pain)   History of Present Illness: Felicia Rivera is a 49 y.o. female with history of osteoarthritis and fibromyalgia syndrome.  She states about 2 weeks ago she started having pain and discomfort in her right thumb.  She states she was having problems bending her thumb and also extending her thumb.  She noticed some swelling on her right thumb knuckles.  The pain has eased off but she still continues to have some discomfort.  None of the other joints are painful.  She continues to have some discomfort in the trochanteric bursa.  Her fibromyalgia has been flaring with the weather change.  Activities of Daily Living:  Patient reports morning stiffness for 15-20 minutes.   Patient Reports nocturnal pain.  Difficulty dressing/grooming: Denies Difficulty climbing stairs: Reports Difficulty getting out of chair: Denies Difficulty using hands for taps, buttons, cutlery, and/or writing: Reports  Review of Systems  Constitutional: Positive for fatigue. Negative for night sweats, weight gain and weight loss.  HENT: Positive for mouth dryness and nose dryness. Negative for mouth sores, trouble swallowing and trouble swallowing.   Eyes: Positive for dryness. Negative for pain, redness and visual disturbance.  Respiratory: Negative for cough, shortness of breath and difficulty breathing.   Cardiovascular: Negative for chest pain, palpitations, hypertension, irregular heartbeat and swelling in legs/feet.  Gastrointestinal: Positive for constipation. Negative for blood in stool and diarrhea.  Endocrine: Negative for increased urination.  Genitourinary: Negative for difficulty urinating, painful urination and vaginal dryness.  Musculoskeletal: Positive  for arthralgias, joint pain, joint swelling and morning stiffness. Negative for myalgias, muscle weakness, muscle tenderness and myalgias.  Skin: Negative for color change, rash, hair loss, skin tightness, ulcers and sensitivity to sunlight.  Allergic/Immunologic: Negative for susceptible to infections.  Neurological: Negative for dizziness, numbness, headaches, memory loss, night sweats and weakness.  Hematological: Negative for bruising/bleeding tendency and swollen glands.  Psychiatric/Behavioral: Negative for depressed mood, confusion and sleep disturbance. The patient is not nervous/anxious.     PMFS History:  Patient Active Problem List   Diagnosis Date Noted  . Other fatigue 07/25/2016  . Other insomnia 07/25/2016  . Bilateral sacroiliitis ([place]) 07/25/2016  . Trochanteric bursitis of both hips 07/25/2016  . Fibromyalgia 02/28/2016  . Ulnar neuropathy at elbow, left 02/28/2016  . Bipolar disorder ([place]) 02/28/2016  . Vitamin D deficiency 02/28/2016  . Migraine 02/28/2016  . RLS (restless legs syndrome) 02/28/2016  . IBS (irritable bowel syndrome) 02/28/2016  . GERD (gastroesophageal reflux disease) 02/28/2016    Past Medical History:  Diagnosis Date  . Bipolar 1 disorder ([place]) 02/28/2016  . Bipolar disorder ([place]) 02/28/2016  . Bursitis   . Fibromyalgia 02/28/2016  . GERD (gastroesophageal reflux disease) 02/28/2016  . IBS (irritable bowel syndrome) 02/28/2016  . Migraine 02/28/2016  . RLS (restless legs syndrome) 02/28/2016  . Ulnar neuropathy at elbow, left 02/28/2016  . Vitamin D deficiency 02/28/2016    Family History  Problem Relation Age of Onset  . Diabetes Mother   . Glaucoma Mother   . Heart Problems Mother   . Hypertension Mother   . Congestive Heart Failure Mother   .  Rheum arthritis Mother        possibly  . Hypertension Father   . High Cholesterol Father   . Heart Problems Father    Past Surgical History:  Procedure Laterality Date  . ABDOMINAL  HYSTERECTOMY    . dental work  04/16/2017   x2   Social History   Social History Narrative  . Not on file    There is no immunization history on file for this patient.   Objective: Vital Signs: BP 103/64 (BP Location: Left Arm, Patient Position: Sitting, Cuff Size: Normal)   Pulse (!) 118   Resp 12   Ht 5\' 4"  (1.626 m)   Wt 109 lb 9.6 oz (49.7 kg)   BMI 18.81 kg/m    Physical Exam Vitals and nursing note reviewed.  Constitutional:      Appearance: She is well-developed.  HENT:     Head: Normocephalic and atraumatic.  Eyes:     Conjunctiva/sclera: Conjunctivae normal.  Cardiovascular:     Rate and Rhythm: Normal rate and regular rhythm.     Heart sounds: Normal heart sounds.  Pulmonary:     Effort: Pulmonary effort is normal.     Breath sounds: Normal breath sounds.  Abdominal:     General: Bowel sounds are normal.     Palpations: Abdomen is soft.  Musculoskeletal:     Cervical back: Normal range of motion.  Lymphadenopathy:     Cervical: No cervical adenopathy.  Skin:    General: Skin is warm and dry.     Capillary Refill: Capillary refill takes less than 2 seconds.  Neurological:     Mental Status: She is alert and oriented to person, place, and time.  Psychiatric:        Behavior: Behavior normal.      Musculoskeletal Exam: C-spine she has some stiffness with range of motion.  Shoulder joints elbow joints wrist joints with good range of motion.  She is good range of motion of bilateral MCPs PIPs and DIPs.  Some thickening of PIP and DIP joints was noted.  She had tenderness on palpation over the right thumb MCP and PIP joint with no synovitis.  No flexor tenosynovitis was noted.  No de Quervain's tenosynovitis was noted.  CDAI Exam: CDAI Score: -- Patient Global: --; Provider Global: -- Swollen: --; Tender: -- Joint Exam 06/29/2019   No joint exam has been documented for this visit   There is currently no information documented on the homunculus. Go to  the Rheumatology activity and complete the homunculus joint exam.  Investigation: No additional findings.  Imaging: No results found.  Recent Labs: Lab Results  Component Value Date   WBC 6.5 07/31/2016   HGB 13.2 07/31/2016   PLT 226 07/31/2016   NA 142 07/31/2016   K 5.4 (H) 07/31/2016   CL 110 07/31/2016   CO2 19 (L) 07/31/2016   GLUCOSE 105 (H) 07/31/2016   BUN 9 07/31/2016   CREATININE 1.42 (H) 07/31/2016   BILITOT 0.4 07/31/2016   ALKPHOS 47 07/31/2016   AST 19 07/31/2016   ALT 10 07/31/2016   PROT 7.2 07/31/2016   ALBUMIN 4.3 07/31/2016   CALCIUM 10.2 07/31/2016   GFRAA 51 (L) 07/31/2016    Speciality Comments: No specialty comments available.  Procedures:  No procedures performed Allergies: Cogentin [benztropine], Hydrocodone, Lyrica [pregabalin], and Omnicef [cefdinir]   Assessment / Plan:     Visit Diagnoses: Pain of right thumb-patient is experiencing pain and discomfort in her right  thumb.  She has tenderness on palpation over PIP and MCP joint but no synovitis was noted.  I will obtain x-ray of her right thumb.  The x-ray was unremarkable except for mild osteoarthritic changes in the PIP joint.  Use of diclofenac gel was discussed.  We will also give her a thumb splint to be used for the next 3 days.  I have advised her to contact us in case her symptoms persist.  Primary osteoarthritis of both hands-she has osteoarthritic changes in her hands and no synovitis was noted.  Trochanteric bursitis of both hips-she has mild tenderness over trochanteric bursa.  She has been doing stretching exercises.  Fibromyalgia-she is having a mild flare due to colder temperatures.  Other fatigue-due to insomnia.  Other insomnia - Topamax 25 mg 3 tablets by mouth at bedtime.  Advised her to get labs from her PCPs forwarded to [place].  Other medical problems are listed as follows:  History of bipolar disorder  RLS (restless legs syndrome)  History of vitamin D  deficiency  History of IBS  History of migraine  History of gastroesophageal reflux (GERD)  Orders: Orders Placed This Encounter  Procedures  . XR Finger Thumb Right   No orders of the defined types were placed in this encounter.   Face-to-face time spent with patient was 30 minutes. Greater than 50% of time was spent in counseling and coordination of care.  Follow-Up Instructions: Return in about 6 months (around 12/30/2019) for FMS, OA.   Bo Merino, MD  Note - This record has been created using Editor, commissioning.  Chart creation errors have been sought, but may not always  have been located. Such creation errors do not reflect on  the standard of medical care.

## 2019-06-30 ENCOUNTER — Ambulatory Visit: Payer: Medicare Other | Admitting: Physician Assistant

## 2019-07-15 ENCOUNTER — Other Ambulatory Visit: Payer: Self-pay | Admitting: Rheumatology

## 2019-08-13 DIAGNOSIS — F319 Bipolar disorder, unspecified: Secondary | ICD-10-CM | POA: Diagnosis not present

## 2019-09-14 ENCOUNTER — Other Ambulatory Visit: Payer: Self-pay | Admitting: Rheumatology

## 2019-09-14 NOTE — Telephone Encounter (Signed)
Last Visit: 06/29/2019 °Next Visit: 12/30/2019 °  °Okay to refill per Dr. Deveshwar  °

## 2019-10-27 ENCOUNTER — Ambulatory Visit: Payer: Medicare Other | Admitting: Rheumatology

## 2019-11-12 DIAGNOSIS — F319 Bipolar disorder, unspecified: Secondary | ICD-10-CM | POA: Diagnosis not present

## 2019-12-07 ENCOUNTER — Other Ambulatory Visit: Payer: Self-pay | Admitting: Rheumatology

## 2019-12-07 NOTE — Telephone Encounter (Signed)
Last Visit: 06/29/2019 Next Visit: 12/30/2019  Okay to refill per Dr. Estanislado Pandy

## 2019-12-17 NOTE — Progress Notes (Signed)
Office Visit Note  Patient: Felicia Rivera             Date of Birth: Oct 03, 1970           MRN: [id number]             PCP: Donald Prose, MD Referring: Donald Prose, MD Visit Date: 12/30/2019 Occupation: @GUAROCC @  Subjective:  Neck pain and increased migraines.   History of Present Illness: Felicia Rivera is a 49 y.o. female with history of fibromyalgia and osteoarthritis.  She states she continues to have increased pain due to the weather change.  She has been also having pain and discomfort in her right second toe.  She states she is having difficulty wearing her shoes.  He denies any injury.  She also has some stiffness around her neck.  She states that her migraines are getting worse.  Activities of Daily Living:  Patient reports morning stiffness for 15  minutes.   Patient Denies nocturnal pain.  Difficulty dressing/grooming: Denies Difficulty climbing stairs: Denies Difficulty getting out of chair: Denies Difficulty using hands for taps, buttons, cutlery, and/or writing: Denies  Review of Systems  Constitutional: Positive for fatigue. Negative for night sweats, weight gain and weight loss.  HENT: Negative for mouth sores, trouble swallowing, trouble swallowing, mouth dryness and nose dryness.   Eyes: Negative for pain, redness, visual disturbance and dryness.  Respiratory: Positive for difficulty breathing. Negative for cough and shortness of breath.        History of asthma  Cardiovascular: Negative for chest pain, palpitations, hypertension, irregular heartbeat and swelling in legs/feet.  Gastrointestinal: Negative for blood in stool, constipation and diarrhea.  Endocrine: Negative for increased urination.  Genitourinary: Negative for vaginal dryness.  Musculoskeletal: Positive for arthralgias, joint pain, myalgias, morning stiffness and myalgias. Negative for joint swelling, muscle weakness and muscle tenderness.  Skin: Negative for color change, rash, hair loss, skin  tightness, ulcers and sensitivity to sunlight.  Allergic/Immunologic: Negative for susceptible to infections.  Neurological: Negative for dizziness, memory loss, night sweats and weakness.  Hematological: Negative for swollen glands.  Psychiatric/Behavioral: Negative for depressed mood and sleep disturbance. The patient is not nervous/anxious.     PMFS History:  Patient Active Problem List   Diagnosis Date Noted   Other fatigue 07/25/2016   Other insomnia 07/25/2016   Bilateral sacroiliitis ([place]) 07/25/2016   Trochanteric bursitis of both hips 07/25/2016   Fibromyalgia 02/28/2016   Ulnar neuropathy at elbow, left 02/28/2016   Bipolar disorder ([place]) 02/28/2016   Vitamin D deficiency 02/28/2016   Migraine 02/28/2016   RLS (restless legs syndrome) 02/28/2016   IBS (irritable bowel syndrome) 02/28/2016   GERD (gastroesophageal reflux disease) 02/28/2016    Past Medical History:  Diagnosis Date   Bipolar 1 disorder ([place]) 02/28/2016   Bipolar disorder ([place]) 02/28/2016   Bursitis    Fibromyalgia 02/28/2016   GERD (gastroesophageal reflux disease) 02/28/2016   IBS (irritable bowel syndrome) 02/28/2016   Migraine 02/28/2016   RLS (restless legs syndrome) 02/28/2016   Ulnar neuropathy at elbow, left 02/28/2016   Vitamin D deficiency 02/28/2016    Family History  Problem Relation Age of Onset   Diabetes Mother    Glaucoma Mother    Heart Problems Mother        pacemaker, defibrilator    Hypertension Mother    Congestive Heart Failure Mother    Rheum arthritis Mother        possibly   Hypertension Father  High Cholesterol Father    Heart Problems Father    Past Surgical History:  Procedure Laterality Date   ABDOMINAL HYSTERECTOMY     dental work  04/16/2017   x2   Social History   Social History Narrative   Not on file   Immunization History  Administered Date(s) Administered   PFIZER SARS-COV-2 Vaccination 07/18/2019, 08/08/2019      Objective: Vital Signs: BP 104/69 (BP Location: Left Arm, Patient Position: Sitting, Cuff Size: Normal)    Pulse 93    Resp 12    Ht 5\' 3"  (1.6 m)    Wt 109 lb (49.4 kg)    BMI 19.31 kg/m    Physical Exam Vitals and nursing note reviewed.  Constitutional:      Appearance: She is well-developed.  HENT:     Head: Normocephalic and atraumatic.  Eyes:     Conjunctiva/sclera: Conjunctivae normal.  Cardiovascular:     Rate and Rhythm: Normal rate and regular rhythm.     Heart sounds: Normal heart sounds.  Pulmonary:     Effort: Pulmonary effort is normal.     Breath sounds: Normal breath sounds.  Abdominal:     General: Bowel sounds are normal.     Palpations: Abdomen is soft.  Musculoskeletal:     Cervical back: Normal range of motion.  Lymphadenopathy:     Cervical: No cervical adenopathy.  Skin:    General: Skin is warm and dry.     Capillary Refill: Capillary refill takes less than 2 seconds.  Neurological:     Mental Status: She is alert and oriented to person, place, and time.  Psychiatric:        Behavior: Behavior normal.      Musculoskeletal Exam: C-spine was in good range of motion.  She had bilateral trapezius spasm.  Thoracic and lumbar spine with good range of motion.  Shoulder joints, elbow joints, wrist joints, MCPs PIPs and DIPs with good range of motion with no synovitis.  Hip joints, knee joints, ankles, MTPs and PIPs with good range of motion with no synovitis.  She has mild tenderness over right second PIP joint of her foot.  No synovitis was noted.  CDAI Exam: CDAI Score: -- Patient Global: --; Provider Global: -- Swollen: --; Tender: -- Joint Exam 12/30/2019   No joint exam has been documented for this visit   There is currently no information documented on the homunculus. Go to the Rheumatology activity and complete the homunculus joint exam.  Investigation: No additional findings.  Imaging: No results found.  Recent Labs: Lab Results   Component Value Date   WBC 6.5 07/31/2016   HGB 13.2 07/31/2016   PLT 226 07/31/2016   NA 142 07/31/2016   K 5.4 (H) 07/31/2016   CL 110 07/31/2016   CO2 19 (L) 07/31/2016   GLUCOSE 105 (H) 07/31/2016   BUN 9 07/31/2016   CREATININE 1.42 (H) 07/31/2016   BILITOT 0.4 07/31/2016   ALKPHOS 47 07/31/2016   AST 19 07/31/2016   ALT 10 07/31/2016   PROT 7.2 07/31/2016   ALBUMIN 4.3 07/31/2016   CALCIUM 10.2 07/31/2016   GFRAA 51 (L) 07/31/2016    Speciality Comments: No specialty comments available.  Procedures:  No procedures performed Allergies: Cogentin [benztropine], Hydrocodone, Lyrica [pregabalin], and Omnicef [cefdinir]   Assessment / Plan:     Visit Diagnoses: Fibromyalgia-she continues to have frequent flares.  She states that with the weather change has been experiencing increased pain.  Primary  osteoarthritis of both hands-no tenderness was noted over PIPs or DIPs.  Right foot pain-she had tenderness over right second PIP joint.  I reviewed x-rays from 2019 which were unremarkable.  She had no synovitis on my examination.  I do not think any blood work is necessary at this point.  She has pes cavus and she walks on the ball of her foot.  She will benefit from metatarsal pads.  I have advised her to schedule an appointment with the podiatrist.  Bilateral pes cavus-she would benefit from arch support and metatarsal pads.  She will see podiatrist.  Trapezius muscle spasm-she failed cortisone injections in the past.  She has been taking muscle relaxer without help.  She believes his symptoms are related to migraines.  She is not having any good response to Topamax.  I have referred her to neurology.  Trochanteric bursitis of both hips-she has some tenderness over trochanteric bursa.  Other insomnia -she takes Topamax 25 mg 3 tablets by mouth at bedtime for migraines which helps insomnia as well.  Other fatigue-related to fibromyalgia and insomnia.  History of bipolar  disorder -she denies any depression or anxiety at this point.  History of vitamin D deficiency  RLS (restless legs syndrome)  History of migraine - Plan: Ambulatory referral to Neurology  History of IBS  History of gastroesophageal reflux (GERD)   She is fully vaccinated against COVID-19.  Use of mask, social distancing and hand hygiene was emphasized.  She should get her booster vaccine when available.  Orders: Orders Placed This Encounter  Procedures   Ambulatory referral to Neurology   No orders of the defined types were placed in this encounter.   Follow-Up Instructions: Return in about 6 months (around 06/28/2020) for FMS, OA.   Bo Merino, MD  Note - This record has been created using Editor, commissioning.  Chart creation errors have been sought, but may not always  have been located. Such creation errors do not reflect on  the standard of medical care.

## 2019-12-30 ENCOUNTER — Encounter: Payer: Self-pay | Admitting: Rheumatology

## 2019-12-30 ENCOUNTER — Ambulatory Visit (INDEPENDENT_AMBULATORY_CARE_PROVIDER_SITE_OTHER): Payer: Medicare Other | Admitting: Rheumatology

## 2019-12-30 ENCOUNTER — Other Ambulatory Visit: Payer: Self-pay

## 2019-12-30 VITALS — BP 104/69 | HR 93 | Resp 12 | Ht 63.0 in | Wt 109.0 lb

## 2019-12-30 DIAGNOSIS — R5383 Other fatigue: Secondary | ICD-10-CM | POA: Diagnosis not present

## 2019-12-30 DIAGNOSIS — M7062 Trochanteric bursitis, left hip: Secondary | ICD-10-CM

## 2019-12-30 DIAGNOSIS — M7061 Trochanteric bursitis, right hip: Secondary | ICD-10-CM

## 2019-12-30 DIAGNOSIS — M62838 Other muscle spasm: Secondary | ICD-10-CM

## 2019-12-30 DIAGNOSIS — M797 Fibromyalgia: Secondary | ICD-10-CM

## 2019-12-30 DIAGNOSIS — M216X1 Other acquired deformities of right foot: Secondary | ICD-10-CM | POA: Diagnosis not present

## 2019-12-30 DIAGNOSIS — Z8639 Personal history of other endocrine, nutritional and metabolic disease: Secondary | ICD-10-CM | POA: Diagnosis not present

## 2019-12-30 DIAGNOSIS — M19041 Primary osteoarthritis, right hand: Secondary | ICD-10-CM | POA: Diagnosis not present

## 2019-12-30 DIAGNOSIS — Z8659 Personal history of other mental and behavioral disorders: Secondary | ICD-10-CM

## 2019-12-30 DIAGNOSIS — Z8719 Personal history of other diseases of the digestive system: Secondary | ICD-10-CM

## 2019-12-30 DIAGNOSIS — Z8669 Personal history of other diseases of the nervous system and sense organs: Secondary | ICD-10-CM

## 2019-12-30 DIAGNOSIS — M79671 Pain in right foot: Secondary | ICD-10-CM

## 2019-12-30 DIAGNOSIS — G2581 Restless legs syndrome: Secondary | ICD-10-CM | POA: Diagnosis not present

## 2019-12-30 DIAGNOSIS — G4709 Other insomnia: Secondary | ICD-10-CM

## 2019-12-30 DIAGNOSIS — M216X2 Other acquired deformities of left foot: Secondary | ICD-10-CM

## 2019-12-30 DIAGNOSIS — M19042 Primary osteoarthritis, left hand: Secondary | ICD-10-CM

## 2020-01-18 ENCOUNTER — Encounter: Payer: Self-pay | Admitting: Neurology

## 2020-01-24 DIAGNOSIS — Z23 Encounter for immunization: Secondary | ICD-10-CM | POA: Diagnosis not present

## 2020-02-09 DIAGNOSIS — Z23 Encounter for immunization: Secondary | ICD-10-CM | POA: Diagnosis not present

## 2020-02-11 DIAGNOSIS — F319 Bipolar disorder, unspecified: Secondary | ICD-10-CM | POA: Diagnosis not present

## 2020-02-28 ENCOUNTER — Other Ambulatory Visit: Payer: Self-pay | Admitting: Rheumatology

## 2020-02-29 NOTE — Telephone Encounter (Signed)
Last Visit: 12/30/2019  Next Visit: 06/30/2020  Current Dose per office note on 12/30/2019: Topamax 25 mg 3 tablets by mouth at bedtime  Okay to refill topamax?

## 2020-02-29 NOTE — Telephone Encounter (Signed)
Please advise patient to reduce the dosing to 2 tablets p.o. nightly.

## 2020-02-29 NOTE — Telephone Encounter (Signed)
Attempted to contact patient and left message for patient to call the office.  

## 2020-03-01 NOTE — Telephone Encounter (Signed)
Patient is in agreement to reduce her Topamax to two tablets p.o. nightly.

## 2020-03-01 NOTE — Telephone Encounter (Signed)
Attempted to contact the patient and left message for patient to call the office.  

## 2020-03-08 DIAGNOSIS — K219 Gastro-esophageal reflux disease without esophagitis: Secondary | ICD-10-CM | POA: Diagnosis not present

## 2020-03-08 DIAGNOSIS — J4540 Moderate persistent asthma, uncomplicated: Secondary | ICD-10-CM | POA: Diagnosis not present

## 2020-03-08 DIAGNOSIS — H1045 Other chronic allergic conjunctivitis: Secondary | ICD-10-CM | POA: Diagnosis not present

## 2020-03-08 DIAGNOSIS — J310 Chronic rhinitis: Secondary | ICD-10-CM | POA: Diagnosis not present

## 2020-03-21 ENCOUNTER — Emergency Department (HOSPITAL_COMMUNITY): Payer: Medicare Other

## 2020-03-21 ENCOUNTER — Emergency Department (HOSPITAL_COMMUNITY)
Admission: EM | Admit: 2020-03-21 | Discharge: 2020-03-21 | Disposition: A | Discharge status: Home / Self Care | Payer: Medicare Other | Attending: Emergency Medicine | Admitting: Emergency Medicine

## 2020-03-21 ENCOUNTER — Other Ambulatory Visit: Payer: Self-pay

## 2020-03-21 DIAGNOSIS — R0602 Shortness of breath: Secondary | ICD-10-CM | POA: Diagnosis not present

## 2020-03-21 DIAGNOSIS — F430 Acute stress reaction: Secondary | ICD-10-CM | POA: Diagnosis not present

## 2020-03-21 DIAGNOSIS — R0789 Other chest pain: Secondary | ICD-10-CM | POA: Insufficient documentation

## 2020-03-21 DIAGNOSIS — Z733 Stress, not elsewhere classified: Secondary | ICD-10-CM | POA: Insufficient documentation

## 2020-03-21 DIAGNOSIS — Z9189 Other specified personal risk factors, not elsewhere classified: Secondary | ICD-10-CM

## 2020-03-21 DIAGNOSIS — Z79899 Other long term (current) drug therapy: Secondary | ICD-10-CM | POA: Diagnosis not present

## 2020-03-21 DIAGNOSIS — R079 Chest pain, unspecified: Secondary | ICD-10-CM | POA: Diagnosis present

## 2020-03-21 DIAGNOSIS — E876 Hypokalemia: Secondary | ICD-10-CM | POA: Diagnosis not present

## 2020-03-21 DIAGNOSIS — R1013 Epigastric pain: Secondary | ICD-10-CM | POA: Diagnosis not present

## 2020-03-21 LAB — COMPREHENSIVE METABOLIC PANEL
ALT: 27 U/L (ref 0–44)
AST: 24 U/L (ref 15–41)
Albumin: 3.9 g/dL (ref 3.5–5.0)
Alkaline Phosphatase: 43 U/L (ref 38–126)
Anion gap: 12 (ref 5–15)
BUN: 20 mg/dL (ref 6–20)
CO2: 23 mmol/L (ref 22–32)
Calcium: 9.3 mg/dL (ref 8.9–10.3)
Chloride: 102 mmol/L (ref 98–111)
Creatinine, Ser: 1.13 mg/dL — ABNORMAL HIGH (ref 0.44–1.00)
GFR, Estimated: 60 mL/min — ABNORMAL LOW (ref >60)
Glucose, Bld: 136 mg/dL — ABNORMAL HIGH (ref 70–99)
Potassium: 3.1 mmol/L — ABNORMAL LOW (ref 3.5–5.1)
Sodium: 137 mmol/L (ref 135–145)
Total Bilirubin: 0.4 mg/dL (ref 0.3–1.2)
Total Protein: 6.9 g/dL (ref 6.5–8.1)

## 2020-03-21 LAB — CBC WITH DIFFERENTIAL/PLATELET
Abs Immature Granulocytes: 0.07 10*3/uL (ref 0.00–0.07)
Basophils Absolute: 0.1 10*3/uL (ref 0.0–0.1)
Basophils Relative: 1 %
Eosinophils Absolute: 0.3 10*3/uL (ref 0.0–0.5)
Eosinophils Relative: 3 %
HCT: 39.2 % (ref 36.0–46.0)
Hemoglobin: 13.1 g/dL (ref 12.0–15.0)
Immature Granulocytes: 1 %
Lymphocytes Relative: 25 %
Lymphs Abs: 2.3 10*3/uL (ref 0.7–4.0)
MCH: 32.1 pg (ref 26.0–34.0)
MCHC: 33.4 g/dL (ref 30.0–36.0)
MCV: 96.1 fL (ref 80.0–100.0)
Monocytes Absolute: 0.6 10*3/uL (ref 0.1–1.0)
Monocytes Relative: 7 %
Neutro Abs: 5.9 10*3/uL (ref 1.7–7.7)
Neutrophils Relative %: 63 %
Platelets: 219 10*3/uL (ref 150–400)
RBC: 4.08 MIL/uL (ref 3.87–5.11)
RDW: 12.1 % (ref 11.5–15.5)
WBC: 9.3 10*3/uL (ref 4.0–10.5)
nRBC: 0 % (ref 0.0–0.2)

## 2020-03-21 LAB — TROPONIN I (HIGH SENSITIVITY): Troponin I (High Sensitivity): <2 ng/L (ref <18)

## 2020-03-21 MED ORDER — ACETAMINOPHEN 325 MG PO TABS
650.0000 mg | ORAL_TABLET | Freq: Once | ORAL | Status: AC
  Administered 2020-03-21: 650 mg via ORAL
  Filled 2020-03-21: qty 2

## 2020-03-21 MED ORDER — FAMOTIDINE IN NACL 20-0.9 MG/50ML-% IV SOLN
20.0000 mg | Freq: Once | INTRAVENOUS | Status: AC
  Administered 2020-03-21: 20 mg via INTRAVENOUS
  Filled 2020-03-21: qty 50

## 2020-03-21 MED ORDER — POTASSIUM CHLORIDE CRYS ER 20 MEQ PO TBCR
40.0000 meq | EXTENDED_RELEASE_TABLET | Freq: Once | ORAL | Status: AC
  Administered 2020-03-21: 40 meq via ORAL
  Filled 2020-03-21: qty 2

## 2020-03-21 MED ORDER — POTASSIUM CHLORIDE ER 10 MEQ PO TBCR: 40.0000 meq | EXTENDED_RELEASE_TABLET | Freq: Every day | ORAL | 0 refills | Status: DC

## 2020-03-21 MED ORDER — AMOXICILLIN 250 MG/5ML PO SUSR: 875.0000 mg | Freq: Two times a day (BID) | ORAL | 0 refills | Status: AC

## 2020-03-21 NOTE — ED Provider Notes (Signed)
[facility] DEPT Provider Note   CSN: [id number] Arrival date & time: 03/21/20  0710     History Chief Complaint  Patient presents with  . Chest Pain  . Shortness of Breath    Felicia Rivera is a 49 y.o. female with a past medical history of bipolar disorder, fibromyalgia, GERD, IBS presenting to the ED with a chief complaint of chest pain.  For the past 24 hours has had intermittent chest pain and shortness of breath that worsened over the past 12 hours.  Reports left-sided chest pain that she describes as a tightness.  Reports worsening episode of GERD last night.  She does have a history of asthma and felt like she was having an asthma attack while she was laying down last night.  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  Denies any recent use last night.  She is concerned that her symptoms could be due to increased stress in her life as well as anxiety.  She denies any history of DVT, PE, MI, recent immobilization, OCP use, cough, abdominal pain, nausea, vomiting or diarrhea.  She denies any SI, HI, AVH.  HPI     Past Medical History:  Diagnosis Date  . Bipolar 1 disorder ([place]) 02/28/2016  . Bipolar disorder ([place]) 02/28/2016  . Bursitis   . Fibromyalgia 02/28/2016  . GERD (gastroesophageal reflux disease) 02/28/2016  . IBS (irritable bowel syndrome) 02/28/2016  . Migraine 02/28/2016  . RLS (restless legs syndrome) 02/28/2016  . Ulnar neuropathy at elbow, left 02/28/2016  . Vitamin D deficiency 02/28/2016    Patient Active Problem List   Diagnosis Date Noted  . Other fatigue 07/25/2016  . Other insomnia 07/25/2016  . Bilateral sacroiliitis ([place]) 07/25/2016  . Trochanteric bursitis of both hips 07/25/2016  . Fibromyalgia 02/28/2016  . Ulnar neuropathy at elbow, left 02/28/2016  . Bipolar disorder ([place]) 02/28/2016  . Vitamin D deficiency 02/28/2016  . Migraine 02/28/2016  . RLS (restless legs syndrome)  02/28/2016  . IBS (irritable bowel syndrome) 02/28/2016  . GERD (gastroesophageal reflux disease) 02/28/2016    Past Surgical History:  Procedure Laterality Date  . ABDOMINAL HYSTERECTOMY    . dental work  04/16/2017   x2     OB History   No obstetric history on file.     Family History  Problem Relation Age of Onset  . Diabetes Mother   . Glaucoma Mother   . Heart Problems Mother        pacemaker, defibrilator   . Hypertension Mother   . Congestive Heart Failure Mother   . Rheum arthritis Mother        possibly  . Hypertension Father   . High Cholesterol Father   . Heart Problems Father     Social History   Tobacco Use  . Smoking status: Never Smoker  . Smokeless tobacco: Never Used  Vaping Use  . Vaping Use: Never used  Substance Use Topics  . Alcohol use: Yes    Comment: Occassionally  . Drug use: Never    Home Medications Prior to Admission medications   Medication Sig Start Date End Date Taking? Authorizing Provider  topiramate (TOPAMAX) 25 MG tablet Take 2 tablets (50 mg total) by mouth at bedtime. 03/01/20   Bo Merino, MD  acetaminophen (TYLENOL) 500 MG tablet Take 500 mg by mouth every 6 (six) hours as needed.    [provider]  albuterol (PROVENTIL HFA;VENTOLIN HFA) 108 (90 Base) MCG/ACT  inhaler Inhale into the lungs every 6 (six) hours as needed for wheezing or shortness of breath.    [provider]  amoxicillin (AMOXIL) 250 MG/5ML suspension Take 17.5 mLs (875 mg total) by mouth 2 (two) times daily for 3 days. 03/21/20 03/24/20  Jvon Meroney, Nicanor Alcon, PA-C  ARNUITY ELLIPTA 100 MCG/ACT AEPB  01/25/16   [provider]  Ascorbic Acid (VITAMIN C) 100 MG tablet Take 100 mg by mouth daily.    [provider]  baclofen (LIORESAL) 10 MG tablet TAKE 1 TABLET BY MOUTH THREE TIMES A DAY Patient taking differently: as needed.  05/13/17   Bo Merino, MD  Bismuth Subsalicylate (PEPTO-BISMOL PO) Take by mouth as needed.     [provider]  BREO ELLIPTA 100-25 MCG/INH AEPB TAKE 1 PUFF BY MOUTH EVERY DAY 11/07/18   [provider]  buPROPion (WELLBUTRIN XL) 300 MG 24 hr tablet  12/09/16   [provider]  Calcium Carbonate Antacid (TUMS PO) Take by mouth as needed.    [provider]  cetirizine (ZYRTEC) 10 MG tablet Take 10 mg by mouth daily.    [provider]  clonazePAM (KLONOPIN) 1 MG tablet Take 1 mg by mouth 3 (three) times daily as needed. Anxiety    [provider]  clonazePAM (KLONOPIN) 1 MG tablet SMARTSIG:1 Tablet(s) By Mouth Twice Daily PRN 05/15/19   [provider]  diclofenac sodium (VOLTAREN) 1 % GEL Voltaren Gel 3 grams to 3 large joints upto TID 5 TUBES with 3 refills 12/19/16   Panwala, Naitik, PA-C  DiphenhydrAMINE HCl (BENADRYL PO) Take by mouth as needed.    [provider]  famotidine (PEPCID) 10 MG tablet Take 10 mg by mouth 2 (two) times daily.    [provider]  fluticasone (FLONASE) 50 MCG/ACT nasal spray Place into both nostrils daily.     [provider]  fluticasone furoate-vilanterol (BREO ELLIPTA) 100-25 MCG/INH AEPB Breo Ellipta 100 mcg-25 mcg/dose powder for inhalation    [provider]  Fructose-Dextrose-Phosphor Acd (ANTI-NAUSEA PO) Take by mouth as needed.    [provider]  lamoTRIgine (LAMICTAL) 200 MG tablet Take 200 mg by mouth daily.    [provider]  loperamide (IMODIUM) 2 MG capsule Take by mouth as needed for diarrhea or loose stools.    [provider]  Menthol, Topical Analgesic, (BIOFREEZE EX) Apply topically as needed.    [provider]  Multiple Vitamins-Minerals (EMERGEN-C IMMUNE PO) Take by mouth.    [provider]  Multiple Vitamins-Minerals (MULTIVITAMIN WITH MINERALS) tablet Take 1 tablet by mouth 2 (two) times daily.     [provider]  Nerve Stimulator (PRO COMFORT TENS UNIT) DEVI by Does not apply route as  needed.     [provider]  OVER THE COUNTER MEDICATION     [provider]  potassium chloride (KLOR-CON) 10 MEQ tablet Take 4 tablets (40 mEq total) by mouth daily for 3 days. 03/21/20 03/24/20  Cyril Railey, Nicanor Alcon, PA-C  promethazine (PHENERGAN) 25 MG tablet Take 25 mg by mouth every 6 (six) hours as needed for nausea or vomiting.    [provider]  QUEtiapine (SEROQUEL) 300 MG tablet Take 300 mg by mouth at bedtime.    [provider]  Simethicone (GAS-X PO) Take by mouth as needed.     [provider]  SUMAtriptan (IMITREX) 25 MG tablet Take 25 mg by mouth every 2 (two) hours as needed for migraine. May repeat in  2 hours if headache persists or recurs.    [provider]    Allergies    Cogentin [benztropine], Hydrocodone, Lyrica [pregabalin], and Omnicef [cefdinir]  Review of Systems   Review of Systems  Constitutional: Negative for appetite change, chills and fever.  HENT: Negative for ear pain, rhinorrhea, sneezing and sore throat.   Eyes: Negative for photophobia and visual disturbance.  Respiratory: Positive for shortness of breath. Negative for cough, chest tightness and wheezing.   Cardiovascular: Positive for chest pain. Negative for palpitations.  Gastrointestinal: Negative for abdominal pain, blood in stool, constipation, diarrhea, nausea and vomiting.  Genitourinary: Negative for dysuria, hematuria and urgency.  Musculoskeletal: Negative for myalgias.  Skin: Negative for rash.  Neurological: Negative for dizziness, weakness and light-headedness.    Physical Exam Updated Vital Signs BP 114/75   Pulse 76   Temp 97.7 F (36.5 C) (Oral)   Resp (!) 22   Ht 5\' 3"  (1.6 m)   Wt 49.4 kg   SpO2 100%   BMI 19.29 kg/m   Physical Exam Vitals and nursing note reviewed.  Constitutional:      General: She is not in acute distress.    Appearance: She is well-developed.  HENT:     Head: Normocephalic and atraumatic.      Nose: Nose normal.  Eyes:     General: No scleral icterus.       Left eye: No discharge.     Conjunctiva/sclera: Conjunctivae normal.  Cardiovascular:     Rate and Rhythm: Normal rate and regular rhythm.     Heart sounds: Normal heart sounds. No murmur heard.  No friction rub. No gallop.   Pulmonary:     Effort: Pulmonary effort is normal. No respiratory distress.     Breath sounds: Normal breath sounds.  Abdominal:     General: Bowel sounds are normal. There is no distension.     Palpations: Abdomen is soft.     Tenderness: There is no abdominal tenderness. There is no guarding.  Musculoskeletal:        General: Normal range of motion.     Cervical back: Normal range of motion and neck supple.     Comments: No lower extremity edema, erythema or calf tenderness bilaterally.  Skin:    General: Skin is warm and dry.     Findings: No rash.  Neurological:     Mental Status: She is alert.     Motor: No abnormal muscle tone.     Coordination: Coordination normal.  Psychiatric:        Mood and Affect: Affect is flat.        Behavior: Behavior is not withdrawn.        Thought Content: Thought content does not include homicidal or suicidal ideation. Thought content does not include homicidal or suicidal plan.     ED Results / Procedures / Treatments   Labs (all labs ordered are listed, but only abnormal results are displayed) Labs Reviewed  COMPREHENSIVE METABOLIC PANEL - Abnormal; Notable for the following components:      Result Value   Potassium 3.1 (*)    Glucose, Bld 136 (*)    Creatinine, Ser 1.13 (*)    GFR, Estimated 60 (*)    All other components within normal limits  CBC WITH DIFFERENTIAL/PLATELET  TROPONIN I (HIGH SENSITIVITY)    EKG EKG Interpretation  Date/Time:  Monday March 21 2020 07:47:54 EST Ventricular Rate:  102 PR Interval:    QRS Duration:  93 QT Interval:  334 QTC Calculation: 435 R Axis:   86 Text Interpretation: Sinus tachycardia Right  atrial enlargement Confirmed by Lennice Sites [id number]) on 03/21/2020 7:50:48 AM   Radiology DG Chest 2 View  Result Date: 03/21/2020 CLINICAL DATA:  Chest and epigastric pain. EXAM: CHEST - 2 VIEW COMPARISON:  None. FINDINGS: The heart size and mediastinal contours are within normal limits. Both lungs are clear. The visualized skeletal structures are unremarkable. IMPRESSION: Normal chest Electronically Signed   By: Nelson Chimes M.D.   On: 03/21/2020 08:06    Procedures Procedures (including critical care time)  Medications Ordered in ED Medications  potassium chloride SA (KLOR-CON) CR tablet 40 mEq (has no administration in time range)  famotidine (PEPCID) IVPB 20 mg premix (0 mg Intravenous Stopped 03/21/20 0841)  acetaminophen (TYLENOL) tablet 650 mg (650 mg Oral Given 03/21/20 0749)    ED Course  I have reviewed the triage vital signs and the nursing notes.  Pertinent labs & imaging results that were available during my care of the patient were reviewed by me and considered in my medical decision making (see chart for details).    MDM Rules/Calculators/A&P                          49 year old female with past medical history of bipolar disorder, fibromyalgia, GERD, IBS presenting to the ED with a chief complaint of chest pain.  For the past 24 hours reports intermittent chest pain and shortness of breath that worsened over the past 12 hours.  Left-sided chest pain described as tightness and worsening episode of GERD last night.  Ports history of asthma as well but worsened with laying down last night.  Reports increased stress and anxiety in her life due to social factors.  Denies any history of DVT, PE, MI, recent immobilization, OCP use, abdominal pain.  On exam patient intermittently tearful when explaining her symptoms.  Chest is nontender, abdomen is nontender.  No lower extremity edema, erythema or calf tenderness of concern me for DVT.  She is not tachycardic, tachypneic or  hypoxic.  EKG without any ischemic changes.  Chest x-ray is unremarkable.  CMP with hypokalemia of 3.1 which is repleted orally and will be discharged with same.  CBC unremarkable.  Troponin is negative x1.  She is a low risk for ACS by heart score.  She is PERC negative.  No structural cause seen on chest x-ray.  Patient given Pepcid and Tylenol here with improvement in her symptoms.  She remains in no acute distress.  Suspect anxiety versus GERD as cause of symptoms.  She is requesting a prescription for amoxicillin that she was prescribed by her allergist but states that the pills are too big for her to swallow.  Will prescribe this as a liquid.  Also prescribed remainder of potassium p.o.  She is comfortable with discharge home.  I offered IV fluids but she declined and would like her IV taken out.  Return precautions given.   Patient is hemodynamically stable, in NAD, and able to ambulate in the ED. Evaluation does not show pathology that would require ongoing emergent intervention or inpatient treatment. I explained the diagnosis to the patient. Pain has been managed and has no complaints prior to discharge. Patient is comfortable with above plan and is stable for discharge at this time. All questions were answered prior to disposition. Strict return precautions for returning to the ED were discussed. Encouraged follow up  with PCP.   An After Visit Summary was printed and given to the patient.   Portions of this note were generated with [employer]. Dictation errors may occur despite best attempts at proofreading.  Final Clinical Impression(s) / ED Diagnoses Final diagnoses:  Chest wall pain  At increased risk for stress  Hypokalemia    Rx / DC Orders ED Discharge Orders         Ordered    amoxicillin (AMOXIL) 250 MG/5ML suspension  2 times daily        03/21/20 0920    potassium chloride (KLOR-CON) 10 MEQ tablet  Daily        03/21/20 0946           Delia Heady,  PA-C 03/21/20 [place], [place], DO 03/21/20 [phone]

## 2020-03-21 NOTE — Discharge Instructions (Addendum)
Take the amoxicillin as directed. Take the potassium as prescribed and have your primary care provider recheck your potassium level in 1 week. Follow-up with your primary care provider. Return to the ER if you start to experience worsening chest pain, shortness of breath, leg swelling or fever.

## 2020-03-21 NOTE — Progress Notes (Signed)
NEUROLOGY CONSULTATION NOTE  AVAYA MCJUNKINS MRN: [id number] DOB: 1970/07/06  Referring provider: Bo Merino, MD Primary care provider: Donald Prose, MD  Reason for consult:  migraines   Subjective:  Felicia Rivera is a 49 year old right-handed female with fibromyalgia, osteoarthritis, asthma and Bipolar 1 disorder who presents for migraines.  History supplemented by referring provider's note.  She has had migraines in highschool, which was attributed to red dye in her food.  They started to increase in college, which was attributed to chocolate, red wine and mozzarrella.  On and off since then.  They have improved after starting topiramate five years ago.  However she still gets "headaches".  Migraines are severe squeezing/pounding pain at base of the skull on the right radiating up to the right eye as well as severe fatigue.  Previously had visual aura like looking through a prism but not anymore.  There is associated some nausea, photophobia but no vomiting, phonophobia, numbness and weakness.  They typically last 6 to 8 hours.  They were occurring every 2 weeks.  She also gets "sinus" headaches with the change in weather with seasonal allergies and postnasal drip.  Also has tension type headaches from neck and shoulders about once a week and a half.    Rescue therapy:  Tylenol.   Current NSAIDS/analgesics:  acetaminophen Current triptans:  Sumatriptan 25mg  Current ergotamine:  none Current anti-emetic:  Promethazine 25mg  Current muscle relaxants:  baclofen 10mg  PRN Current Antihypertensive medications:  none Current Antidepressant medications:  Bupropion 300mg  Current Anticonvulsant medications:  topiramate 50mg  at bedtime, lamotrigine 200mg  daily Current anti-CGRP:  none Current Vitamins/Herbal/Supplements:  MVI Current Antihistamines/Decongestants:  diphenhydramine Other therapy:  Hot shower, Biofreeze Hormone/birth control:  none Other medications:  Seroquel  Past  NSAIDS/analgesics:  Ibuprofen, naproxen Past abortive triptans:  none Past abortive ergotamine:  none Past muscle relaxants:  none Past anti-emetic:  none Past antihypertensive medications:  none Past antidepressant medications:  venlafaxine Past anticonvulsant medications:  none Past anti-CGRP:  none Past vitamins/Herbal/Supplements:  none Past antihistamines/decongestants:  none Other past therapies:  none  Caffeine:  Coffee once in a while.  Drinks Dr. Malachi Bonds daily. Diet:  Dr. Malachi Bonds and/or Dagmar Hait daily.   Exercise:  routine Depression:  yes; Anxiety:  yes Other pain:  fibromyalgia Sleep hygiene:  varies Family history of headache:  Both parents had "sinus headaches"      PAST MEDICAL HISTORY: Past Medical History:  Diagnosis Date  . Bipolar 1 disorder ([place]) 02/28/2016  . Bipolar disorder ([place]) 02/28/2016  . Bursitis   . Fibromyalgia 02/28/2016  . GERD (gastroesophageal reflux disease) 02/28/2016  . IBS (irritable bowel syndrome) 02/28/2016  . Migraine 02/28/2016  . RLS (restless legs syndrome) 02/28/2016  . Ulnar neuropathy at elbow, left 02/28/2016  . Vitamin D deficiency 02/28/2016    PAST SURGICAL HISTORY: Past Surgical History:  Procedure Laterality Date  . ABDOMINAL HYSTERECTOMY    . dental work  04/16/2017   x2    MEDICATIONS: Current Outpatient Medications on File Prior to Visit  Medication Sig Dispense Refill  . topiramate (TOPAMAX) 25 MG tablet Take 2 tablets (50 mg total) by mouth at bedtime. 180 tablet 0  . acetaminophen (TYLENOL) 500 MG tablet Take 500 mg by mouth every 6 (six) hours as needed.    Marland Kitchen albuterol (PROVENTIL HFA;VENTOLIN HFA) 108 (90 Base) MCG/ACT inhaler Inhale into the lungs every 6 (six) hours as needed for wheezing or shortness of breath.    . ARNUITY ELLIPTA 100  MCG/ACT AEPB     . Ascorbic Acid (VITAMIN C) 100 MG tablet Take 100 mg by mouth daily.    . baclofen (LIORESAL) 10 MG tablet TAKE 1 TABLET BY MOUTH THREE TIMES A DAY  (Patient taking differently: as needed. ) 90 tablet 4  . Bismuth Subsalicylate (PEPTO-BISMOL PO) Take by mouth as needed.    Marland Kitchen BREO ELLIPTA 100-25 MCG/INH AEPB TAKE 1 PUFF BY MOUTH EVERY DAY    . buPROPion (WELLBUTRIN XL) 300 MG 24 hr tablet     . Calcium Carbonate Antacid (TUMS PO) Take by mouth as needed.    . cetirizine (ZYRTEC) 10 MG tablet Take 10 mg by mouth daily.    . clonazePAM (KLONOPIN) 1 MG tablet Take 1 mg by mouth 3 (three) times daily as needed. Anxiety    . clonazePAM (KLONOPIN) 1 MG tablet SMARTSIG:1 Tablet(s) By Mouth Twice Daily PRN    . diclofenac sodium (VOLTAREN) 1 % GEL Voltaren Gel 3 grams to 3 large joints upto TID 5 TUBES with 3 refills 5 Tube 3  . DiphenhydrAMINE HCl (BENADRYL PO) Take by mouth as needed.    . famotidine (PEPCID) 10 MG tablet Take 10 mg by mouth 2 (two) times daily.    . fluticasone (FLONASE) 50 MCG/ACT nasal spray Place into both nostrils daily.     . fluticasone furoate-vilanterol (BREO ELLIPTA) 100-25 MCG/INH AEPB Breo Ellipta 100 mcg-25 mcg/dose powder for inhalation    . Fructose-Dextrose-Phosphor Acd (ANTI-NAUSEA PO) Take by mouth as needed.    . lamoTRIgine (LAMICTAL) 200 MG tablet Take 200 mg by mouth daily.    Marland Kitchen loperamide (IMODIUM) 2 MG capsule Take by mouth as needed for diarrhea or loose stools.    . Menthol, Topical Analgesic, (BIOFREEZE EX) Apply topically as needed.    . Multiple Vitamins-Minerals (EMERGEN-C IMMUNE PO) Take by mouth.    . Multiple Vitamins-Minerals (MULTIVITAMIN WITH MINERALS) tablet Take 1 tablet by mouth 2 (two) times daily.     Archie Endo Stimulator (PRO COMFORT TENS UNIT) DEVI by Does not apply route as needed.     Marland Kitchen OVER THE COUNTER MEDICATION     . promethazine (PHENERGAN) 25 MG tablet Take 25 mg by mouth every 6 (six) hours as needed for nausea or vomiting.    Marland Kitchen QUEtiapine (SEROQUEL) 300 MG tablet Take 300 mg by mouth at bedtime.    . Simethicone (GAS-X PO) Take by mouth as needed.     . SUMAtriptan (IMITREX) 25  MG tablet Take 25 mg by mouth every 2 (two) hours as needed for migraine. May repeat in 2 hours if headache persists or recurs.     No current facility-administered medications on file prior to visit.    ALLERGIES: Allergies  Allergen Reactions  . Cogentin [Benztropine] Other (See Comments)    Dehydration and cognitive impairment    . Hydrocodone Nausea Only  . Lyrica [Pregabalin] Other (See Comments)    Jittery   . Omnicef [Cefdinir] Nausea Only    FAMILY HISTORY: Family History  Problem Relation Age of Onset  . Diabetes Mother   . Glaucoma Mother   . Heart Problems Mother        pacemaker, defibrilator   . Hypertension Mother   . Congestive Heart Failure Mother   . Rheum arthritis Mother        possibly  . Hypertension Father   . High Cholesterol Father   . Heart Problems Father    SOCIAL HISTORY: Social History  Socioeconomic History  . Marital status: Single    Spouse name: Not on file  . Number of children: Not on file  . Years of education: Not on file  . Highest education level: Not on file  Occupational History  . Not on file  Tobacco Use  . Smoking status: Never Smoker  . Smokeless tobacco: Never Used  Vaping Use  . Vaping Use: Never used  Substance and Sexual Activity  . Alcohol use: Yes    Comment: Occassionally  . Drug use: Never  . Sexual activity: Yes    Partners: Male    Birth control/protection: Surgical  Other Topics Concern  . Not on file  Social History Narrative  . Not on file   Social Determinants of Health   Financial Resource Strain:   . Difficulty of Paying Living Expenses: Not on file  Food Insecurity:   . Worried About Charity fundraiser in the Last Year: Not on file  . Ran Out of Food in the Last Year: Not on file  Transportation Needs:   . Lack of Transportation (Medical): Not on file  . Lack of Transportation (Non-Medical): Not on file  Physical Activity:   . Days of Exercise per Week: Not on file  . Minutes of  Exercise per Session: Not on file  Stress:   . Feeling of Stress : Not on file  Social Connections:   . Frequency of Communication with Friends and Family: Not on file  . Frequency of Social Gatherings with Friends and Family: Not on file  . Attends Religious Services: Not on file  . Active Member of Clubs or Organizations: Not on file  . Attends Archivist Meetings: Not on file  . Marital Status: Not on file  Intimate Partner Violence:   . Fear of Current or Ex-Partner: Not on file  . Emotionally Abused: Not on file  . Physically Abused: Not on file  . Sexually Abused: Not on file    Objective:  Blood pressure 100/69, pulse (!) 115, height 5\' 4"  (1.626 m), weight 106 lb 12.8 oz (48.4 kg), SpO2 97 %. General: No acute distress.  Patient appears well-groomed.   Head:  Normocephalic/atraumatic Eyes:  fundi examined but not visualized Neck: supple, no paraspinal tenderness, full range of motion Back: No paraspinal tenderness Heart: regular rate and rhythm Lungs: Clear to auscultation bilaterally. Vascular: No carotid bruits. Neurological Exam: Mental status: alert and oriented to person, place, and time, recent and remote memory intact, fund of knowledge intact, attention and concentration intact, speech fluent and not dysarthric, language intact. Cranial nerves: CN I: not tested CN II: pupils equal, round and reactive to light, visual fields intact CN III, IV, VI:  full range of motion, no nystagmus, no ptosis CN V: facial sensation intact. CN VII: upper and lower face symmetric CN VIII: hearing intact CN IX, X: gag intact, uvula midline CN XI: sternocleidomastoid and trapezius muscles intact CN XII: tongue midline Bulk & Tone: normal, no fasciculations. Motor:  muscle strength 5/5 throughout Sensation:  Pinprick, temperature and vibratory sensation intact. Deep Tendon Reflexes:  2+ throughout,  toes downgoing.   Finger to nose testing:  Without dysmetria.   Heel  to shin:  Without dysmetria.   Gait:  Normal station and stride.  Romberg negative.  Assessment/Plan:   Migraine without aura, without status migrainosus, not intractable Fibromyalgia Anxiety and depression - likely a significant factor for her headaches  Migraines appear to be fairly well-controlled.  I  have no other recommendations and may be managed by her PCP 1.  Topiramate 50mg  at bedtime 2.  May use Excedrin for migraine rescue 3.  Limit use of pain relievers to no more than 2 days out of week to prevent risk of rebound or medication-overuse headache. 4.   Keep headache diary 5.  Return if headaches become more frequent.    Thank you for allowing me to take part in the care of this patient.  Metta Clines, DO  CC:  Donald Prose, MD  Bo Merino, MD

## 2020-03-21 NOTE — ED Triage Notes (Signed)
Pt reports chest pain for the past 24 hours that has worsened in the past 12 hrs. Pt reports unradiating left sided chest pain. Pt reports PMH of acid reflux that "hit her pretty hard last night". Pt reports PMH of asthma. Pt reports that she has never had an asthma attack while laying down, but she had one this morning. Pt did not take her rescue inhaler this morning

## 2020-03-22 ENCOUNTER — Encounter: Payer: Self-pay | Admitting: Neurology

## 2020-03-22 ENCOUNTER — Ambulatory Visit (INDEPENDENT_AMBULATORY_CARE_PROVIDER_SITE_OTHER): Payer: Medicare Other | Admitting: Neurology

## 2020-03-22 VITALS — BP 100/69 | HR 115 | Ht 64.0 in | Wt 106.8 lb

## 2020-03-22 DIAGNOSIS — F32A Depression, unspecified: Secondary | ICD-10-CM

## 2020-03-22 DIAGNOSIS — G43009 Migraine without aura, not intractable, without status migrainosus: Secondary | ICD-10-CM | POA: Diagnosis not present

## 2020-03-22 DIAGNOSIS — F419 Anxiety disorder, unspecified: Secondary | ICD-10-CM | POA: Diagnosis not present

## 2020-03-22 DIAGNOSIS — M797 Fibromyalgia: Secondary | ICD-10-CM

## 2020-03-22 NOTE — Patient Instructions (Signed)
Migraines are what we would considered controlled.  Medications may be managed by your PCP  1. Topiramate 50mg  at bedtime 2. Use Excedrin as needed for migraine 3. Limit use of pain relievers to no more than 2 days out of the week.  These medications include acetaminophen, NSAIDs (ibuprofen/Advil/Motrin, naproxen/Aleve, triptans (Imitrex/sumatriptan), Excedrin, and narcotics.  This will help reduce risk of rebound headaches. 4. Be aware of common food triggers:  - Caffeine:  coffee, black tea, cola, Mt. Dew  - Chocolate  - Dairy:  aged cheeses (brie, blue, cheddar, gouda, [place], provolone, [place], Swiss, etc), chocolate milk, buttermilk, sour cream, limit eggs and yogurt  - Nuts, peanut butter  - Alcohol  - Cereals/grains:  FRESH breads (fresh bagels, sourdough, doughnuts), yeast productions  - Processed/canned/aged/cured meats (pre-packaged deli meats, hotdogs)  - MSG/glutamate:  soy sauce, flavor enhancer, pickled/preserved/marinated foods  - Sweeteners:  aspartame (Equal, Nutrasweet).  Sugar and Splenda are okay  - Vegetables:  legumes (lima beans, lentils, snow peas, fava beans, pinto peans, peas, garbanzo beans), sauerkraut, onions, olives, pickles  - Fruit:  avocados, bananas, citrus fruit (orange, lemon, grapefruit), mango  - Other:  Frozen meals, macaroni and cheese 5. Routine exercise 6. Stay adequately hydrated (aim for 64 oz water daily) 7. Keep headache diary 8. Maintain proper stress management 9. Maintain proper sleep hygiene 10. Do not skip meals 11. Consider supplements:  magnesium citrate 400mg  daily, riboflavin 400mg  daily, coenzyme Q10 100mg  three times daily.

## 2020-04-19 ENCOUNTER — Other Ambulatory Visit: Payer: Self-pay | Admitting: Rheumatology

## 2020-04-19 NOTE — Telephone Encounter (Addendum)
To early to refill

## 2020-05-10 DIAGNOSIS — F3132 Bipolar disorder, current episode depressed, moderate: Secondary | ICD-10-CM | POA: Diagnosis not present

## 2020-05-10 DIAGNOSIS — F411 Generalized anxiety disorder: Secondary | ICD-10-CM | POA: Diagnosis not present

## 2020-06-16 NOTE — Progress Notes (Signed)
Office Visit Note  Patient: Felicia Rivera             Date of Birth: 1970-09-07           MRN: [id number]             PCP: Donald Prose, MD Referring: Donald Prose, MD Visit Date: 06/30/2020 Occupation: @GUAROCC @  Subjective:  Pain in multiple joints and muscles.   History of Present Illness: Irva Loser Hoctor is a 50 y.o. female with history of fibromyalgia and osteoarthritis.  She states she continues to have generalized pain and discomfort.  She has pain in multiple joints and muscles.  She states the colder weather causes increased discomfort.  She complains of discomfort in her trapezius area and trochanteric region.  She has been taking some Latin dance classes and learning how to maintain a good posture which has been helpful for her.  Activities of Daily Living:  Patient reports morning stiffness for 20 minutes.   Patient Reports nocturnal pain.  Difficulty dressing/grooming: Denies Difficulty climbing stairs: Reports Difficulty getting out of chair: Denies Difficulty using hands for taps, buttons, cutlery, and/or writing: Reports  Review of Systems  Constitutional: Positive for fatigue. Negative for night sweats, weight gain and weight loss.  HENT: Negative for mouth sores, trouble swallowing, trouble swallowing, mouth dryness and nose dryness.   Eyes: Positive for itching. Negative for pain, redness, visual disturbance and dryness.  Respiratory: Positive for shortness of breath. Negative for cough, hemoptysis and difficulty breathing.        Asthma  Cardiovascular: Negative for chest pain, palpitations, hypertension, irregular heartbeat and swelling in legs/feet.  Gastrointestinal: Positive for abdominal pain and constipation. Negative for blood in stool and diarrhea.  Endocrine: Negative for increased urination.  Genitourinary: Negative for painful urination and vaginal dryness.  Musculoskeletal: Positive for arthralgias, joint pain, myalgias, morning stiffness, muscle  tenderness and myalgias. Negative for joint swelling and muscle weakness.  Skin: Negative for color change, rash, hair loss, redness, skin tightness, ulcers and sensitivity to sunlight.  Allergic/Immunologic: Negative for susceptible to infections.  Neurological: Positive for headaches, parasthesias, memory loss and weakness. Negative for dizziness and night sweats.  Hematological: Negative for swollen glands.  Psychiatric/Behavioral: Positive for sleep disturbance. Negative for depressed mood. The patient is not nervous/anxious.     PMFS History:  Patient Active Problem List   Diagnosis Date Noted  . Other fatigue 07/25/2016  . Other insomnia 07/25/2016  . Bilateral sacroiliitis ([place]) 07/25/2016  . Trochanteric bursitis of both hips 07/25/2016  . Fibromyalgia 02/28/2016  . Ulnar neuropathy at elbow, left 02/28/2016  . Bipolar disorder ([place]) 02/28/2016  . Vitamin D deficiency 02/28/2016  . Migraine 02/28/2016  . RLS (restless legs syndrome) 02/28/2016  . IBS (irritable bowel syndrome) 02/28/2016  . GERD (gastroesophageal reflux disease) 02/28/2016    Past Medical History:  Diagnosis Date  . Bipolar 1 disorder ([place]) 02/28/2016  . Bipolar disorder ([place]) 02/28/2016  . Bursitis   . Fibromyalgia 02/28/2016  . GERD (gastroesophageal reflux disease) 02/28/2016  . IBS (irritable bowel syndrome) 02/28/2016  . Migraine 02/28/2016  . RLS (restless legs syndrome) 02/28/2016  . Ulnar neuropathy at elbow, left 02/28/2016  . Vitamin D deficiency 02/28/2016    Family History  Problem Relation Age of Onset  . Diabetes Mother   . Glaucoma Mother   . Heart Problems Mother        pacemaker, defibrilator   . Hypertension Mother   . Congestive Heart Failure Mother   .  Rheum arthritis Mother        possibly  . Hypertension Father   . High Cholesterol Father   . Heart Problems Father    Past Surgical History:  Procedure Laterality Date  . ABDOMINAL HYSTERECTOMY    . dental work   04/16/2017   x2   Social History   Social History Narrative   Left handed   Immunization History  Administered Date(s) Administered  . PFIZER(Purple Top)SARS-COV-2 Vaccination 07/18/2019, 08/08/2019, 02/09/2020     Objective: Vital Signs: BP 95/70 (BP Location: Left Arm, Patient Position: Sitting, Cuff Size: Normal)   Pulse (!) 105   Ht 5\' 4"  (1.626 m)   Wt 111 lb 3.2 oz (50.4 kg)   BMI 19.09 kg/m    Physical Exam Vitals and nursing note reviewed.  Constitutional:      Appearance: She is well-developed and well-nourished.  HENT:     Head: Normocephalic and atraumatic.  Eyes:     Extraocular Movements: EOM normal.     Conjunctiva/sclera: Conjunctivae normal.  Cardiovascular:     Rate and Rhythm: Normal rate and regular rhythm.     Pulses: Intact distal pulses.     Heart sounds: Normal heart sounds.  Pulmonary:     Effort: Pulmonary effort is normal.     Breath sounds: Normal breath sounds.  Abdominal:     General: Bowel sounds are normal.     Palpations: Abdomen is soft.  Musculoskeletal:     Cervical back: Normal range of motion.  Lymphadenopathy:     Cervical: No cervical adenopathy.  Skin:    General: Skin is warm and dry.     Capillary Refill: Capillary refill takes less than 2 seconds.  Neurological:     Mental Status: She is alert and oriented to person, place, and time.  Psychiatric:        Mood and Affect: Mood and affect normal.        Behavior: Behavior normal.      Musculoskeletal Exam: C-spine was in good range of motion with some discomfort.  She had bilateral trapezius spasm.  Shoulder joints, elbow joints, wrist joints, MCPs PIPs and DIPs with good range of motion with no synovitis.  Hip joints, knee joints, ankles, MTPs and PIPs with good range of motion with no synovitis.  CDAI Exam: CDAI Score: -- Patient Global: --; Provider Global: -- Swollen: --; Tender: -- Joint Exam 06/30/2020   No joint exam has been documented for this visit    There is currently no information documented on the homunculus. Go to the Rheumatology activity and complete the homunculus joint exam.  Investigation: No additional findings.  Imaging: No results found.  Recent Labs: Lab Results  Component Value Date   WBC 9.3 03/21/2020   HGB 13.1 03/21/2020   PLT 219 03/21/2020   NA 137 03/21/2020   K 3.1 (L) 03/21/2020   CL 102 03/21/2020   CO2 23 03/21/2020   GLUCOSE 136 (H) 03/21/2020   BUN 20 03/21/2020   CREATININE 1.13 (H) 03/21/2020   BILITOT 0.4 03/21/2020   ALKPHOS 43 03/21/2020   AST 24 03/21/2020   ALT 27 03/21/2020   PROT 6.9 03/21/2020   ALBUMIN 3.9 03/21/2020   CALCIUM 9.3 03/21/2020   GFRAA 51 (L) 07/31/2016    Speciality Comments: No specialty comments available.  Procedures:  No procedures performed Allergies: Cogentin [benztropine], Erythromycin, Hydrocodone, Lyrica [pregabalin], Nsaids, and Omnicef [cefdinir]   Assessment / Plan:     Visit Diagnoses: Primary  osteoarthritis of both hands-she has PIP and DIP thickening but no synovitis.  Joint protection was discussed.  Trochanteric bursitis of both hips-she had tenderness over bilateral trochanteric bursa.  Stretching exercises were emphasized.  Pain in right foot-she has been wearing proper fitting shoes which has been helpful.  Pes cavus - Bilateral.   Fibromyalgia-she has generalized pain and discomfort.  She has positive hyperalgesia.  She uses baclofen only occasionally for flares.  Trapezius muscle spasm - she failed cortisone injections in the past.  Stretching exercises were emphasized.  Handout on exercises was given.  Other fatigue-related to fibromyalgia and insomnia.  Other insomnia - Topamax 25 mg 2 tablets by mouth at bedtime for migraines.  Have advised patient to switch Topamax prescription to her PCP for the treatment of migraines.  History of migraine -she was evaluated by Dr. Tomi Likens.  He did not recommend any other treatment for  migraines.  He felt migraine headaches are well controlled with Topamax.  History of vitamin D deficiency-she is on vitamin D supplement.  History of bipolar disorder  History of IBS  RLS (restless legs syndrome)  History of gastroesophageal reflux (GERD)  Orders: No orders of the defined types were placed in this encounter.  No orders of the defined types were placed in this encounter.  .  Follow-Up Instructions: Return in about 6 months (around 12/31/2020) for Osteoarthritis, FMS.   Bo Merino, MD  Note - This record has been created using Editor, commissioning.  Chart creation errors have been sought, but may not always  have been located. Such creation errors do not reflect on  the standard of medical care.

## 2020-06-29 DIAGNOSIS — Z1231 Encounter for screening mammogram for malignant neoplasm of breast: Secondary | ICD-10-CM | POA: Diagnosis not present

## 2020-06-29 DIAGNOSIS — Z01419 Encounter for gynecological examination (general) (routine) without abnormal findings: Secondary | ICD-10-CM | POA: Diagnosis not present

## 2020-06-29 DIAGNOSIS — Z86001 Personal history of in-situ neoplasm of cervix uteri: Secondary | ICD-10-CM | POA: Diagnosis not present

## 2020-06-30 ENCOUNTER — Ambulatory Visit (INDEPENDENT_AMBULATORY_CARE_PROVIDER_SITE_OTHER): Payer: Medicare Other | Admitting: Rheumatology

## 2020-06-30 ENCOUNTER — Encounter: Payer: Self-pay | Admitting: Rheumatology

## 2020-06-30 ENCOUNTER — Other Ambulatory Visit: Payer: Self-pay

## 2020-06-30 VITALS — BP 95/70 | HR 105 | Ht 64.0 in | Wt 111.2 lb

## 2020-06-30 DIAGNOSIS — Z8669 Personal history of other diseases of the nervous system and sense organs: Secondary | ICD-10-CM | POA: Diagnosis not present

## 2020-06-30 DIAGNOSIS — R5383 Other fatigue: Secondary | ICD-10-CM | POA: Diagnosis not present

## 2020-06-30 DIAGNOSIS — M19041 Primary osteoarthritis, right hand: Secondary | ICD-10-CM | POA: Diagnosis not present

## 2020-06-30 DIAGNOSIS — M79671 Pain in right foot: Secondary | ICD-10-CM

## 2020-06-30 DIAGNOSIS — Q667 Congenital pes cavus, unspecified foot: Secondary | ICD-10-CM | POA: Diagnosis not present

## 2020-06-30 DIAGNOSIS — M7061 Trochanteric bursitis, right hip: Secondary | ICD-10-CM

## 2020-06-30 DIAGNOSIS — M62838 Other muscle spasm: Secondary | ICD-10-CM | POA: Diagnosis not present

## 2020-06-30 DIAGNOSIS — Z8659 Personal history of other mental and behavioral disorders: Secondary | ICD-10-CM

## 2020-06-30 DIAGNOSIS — M7062 Trochanteric bursitis, left hip: Secondary | ICD-10-CM

## 2020-06-30 DIAGNOSIS — Z8719 Personal history of other diseases of the digestive system: Secondary | ICD-10-CM

## 2020-06-30 DIAGNOSIS — M19042 Primary osteoarthritis, left hand: Secondary | ICD-10-CM

## 2020-06-30 DIAGNOSIS — Z8639 Personal history of other endocrine, nutritional and metabolic disease: Secondary | ICD-10-CM | POA: Diagnosis not present

## 2020-06-30 DIAGNOSIS — G2581 Restless legs syndrome: Secondary | ICD-10-CM

## 2020-06-30 DIAGNOSIS — G4709 Other insomnia: Secondary | ICD-10-CM

## 2020-06-30 DIAGNOSIS — M797 Fibromyalgia: Secondary | ICD-10-CM

## 2020-06-30 NOTE — Patient Instructions (Signed)

## 2020-07-19 DIAGNOSIS — J300 Vasomotor rhinitis: Secondary | ICD-10-CM | POA: Diagnosis not present

## 2020-07-19 DIAGNOSIS — J310 Chronic rhinitis: Secondary | ICD-10-CM | POA: Diagnosis not present

## 2020-07-19 DIAGNOSIS — J4540 Moderate persistent asthma, uncomplicated: Secondary | ICD-10-CM | POA: Diagnosis not present

## 2020-07-19 DIAGNOSIS — H1045 Other chronic allergic conjunctivitis: Secondary | ICD-10-CM | POA: Diagnosis not present

## 2020-08-20 ENCOUNTER — Other Ambulatory Visit: Payer: Self-pay | Admitting: Rheumatology

## 2020-08-21 NOTE — Telephone Encounter (Signed)
Next Visit: 01/03/2021  Last Visit: 06/30/2020  Last Fill: 03/01/2020  Dx: Other insomnia   Current Dose per office note on 06/30/2020, Topamax 25 mg 2 tablets by mouth at bedtime for migraines.   Okay to refill topamax?

## 2020-09-15 DIAGNOSIS — F3132 Bipolar disorder, current episode depressed, moderate: Secondary | ICD-10-CM | POA: Diagnosis not present

## 2020-09-15 DIAGNOSIS — F411 Generalized anxiety disorder: Secondary | ICD-10-CM | POA: Diagnosis not present

## 2020-11-16 ENCOUNTER — Other Ambulatory Visit: Payer: Self-pay | Admitting: Physician Assistant

## 2020-11-16 NOTE — Telephone Encounter (Signed)
Next Visit: 01/03/2021   Last Visit: 06/30/2020   Last Fill: 03/01/2020   Dx: Other insomnia History of migraine   Current Dose per office note on 06/30/2020, Topamax 25 mg 2 tablets by mouth at bedtime for migraines.    Okay to refill topamax?

## 2020-12-13 DIAGNOSIS — F411 Generalized anxiety disorder: Secondary | ICD-10-CM | POA: Diagnosis not present

## 2020-12-13 DIAGNOSIS — F3132 Bipolar disorder, current episode depressed, moderate: Secondary | ICD-10-CM | POA: Diagnosis not present

## 2020-12-20 NOTE — Progress Notes (Deleted)
Office Visit Note  Patient: Felicia Rivera             Date of Birth: 06/05/1970           MRN: [id number]             PCP: Donald Prose, MD Referring: Donald Prose, MD Visit Date: 01/03/2021 Occupation: '@GUAROCC'$ @  Subjective:  No chief complaint on file.   History of Present Illness: Felicia Rivera is a 50 y.o. female ***   Activities of Daily Living:  Patient reports morning stiffness for *** {minute/hour:19697}.   Patient {ACTIONS;DENIES/REPORTS:21021675::"Denies"} nocturnal pain.  Difficulty dressing/grooming: {ACTIONS;DENIES/REPORTS:21021675::"Denies"} Difficulty climbing stairs: {ACTIONS;DENIES/REPORTS:21021675::"Denies"} Difficulty getting out of chair: {ACTIONS;DENIES/REPORTS:21021675::"Denies"} Difficulty using hands for taps, buttons, cutlery, and/or writing: {ACTIONS;DENIES/REPORTS:21021675::"Denies"}  No Rheumatology ROS completed.   PMFS History:  Patient Active Problem List   Diagnosis Date Noted   Other fatigue 07/25/2016   Other insomnia 07/25/2016   Bilateral sacroiliitis ([place]) 07/25/2016   Trochanteric bursitis of both hips 07/25/2016   Fibromyalgia 02/28/2016   Ulnar neuropathy at elbow, left 02/28/2016   Bipolar disorder ([place]) 02/28/2016   Vitamin D deficiency 02/28/2016   Migraine 02/28/2016   RLS (restless legs syndrome) 02/28/2016   IBS (irritable bowel syndrome) 02/28/2016   GERD (gastroesophageal reflux disease) 02/28/2016    Past Medical History:  Diagnosis Date   Bipolar 1 disorder ([place]) 02/28/2016   Bipolar disorder ([place]) 02/28/2016   Bursitis    Fibromyalgia 02/28/2016   GERD (gastroesophageal reflux disease) 02/28/2016   IBS (irritable bowel syndrome) 02/28/2016   Migraine 02/28/2016   RLS (restless legs syndrome) 02/28/2016   Ulnar neuropathy at elbow, left 02/28/2016   Vitamin D deficiency 02/28/2016    Family History  Problem Relation Age of Onset   Diabetes Mother    Glaucoma Mother    Heart Problems Mother         pacemaker, defibrilator    Hypertension Mother    Congestive Heart Failure Mother    Rheum arthritis Mother        possibly   Hypertension Father    High Cholesterol Father    Heart Problems Father    Past Surgical History:  Procedure Laterality Date   ABDOMINAL HYSTERECTOMY     dental work  04/16/2017   x2   Social History   Social History Narrative   Left handed   Immunization History  Administered Date(s) Administered   PFIZER(Purple Top)SARS-COV-2 Vaccination 07/18/2019, 08/08/2019, 02/09/2020     Objective: Vital Signs: There were no vitals taken for this visit.   Physical Exam   Musculoskeletal Exam: ***  CDAI Exam: CDAI Score: -- Patient Global: --; Provider Global: -- Swollen: --; Tender: -- Joint Exam 01/03/2021   No joint exam has been documented for this visit   There is currently no information documented on the homunculus. Go to the Rheumatology activity and complete the homunculus joint exam.  Investigation: No additional findings.  Imaging: No results found.  Recent Labs: Lab Results  Component Value Date   WBC 9.3 03/21/2020   HGB 13.1 03/21/2020   PLT 219 03/21/2020   NA 137 03/21/2020   K 3.1 (L) 03/21/2020   CL 102 03/21/2020   CO2 23 03/21/2020   GLUCOSE 136 (H) 03/21/2020   BUN 20 03/21/2020   CREATININE 1.13 (H) 03/21/2020   BILITOT 0.4 03/21/2020   ALKPHOS 43 03/21/2020   AST 24 03/21/2020   ALT 27 03/21/2020   PROT 6.9 03/21/2020   ALBUMIN 3.9 03/21/2020  CALCIUM 9.3 03/21/2020   GFRAA 51 (L) 07/31/2016    Speciality Comments: No specialty comments available.  Procedures:  No procedures performed Allergies: Cogentin [benztropine], Erythromycin, Hydrocodone, Lyrica [pregabalin], Nsaids, and Omnicef [cefdinir]   Assessment / Plan:     Visit Diagnoses: No diagnosis found.  Orders: No orders of the defined types were placed in this encounter.  No orders of the defined types were placed in this  encounter.   Face-to-face time spent with patient was *** minutes. Greater than 50% of time was spent in counseling and coordination of care.  Follow-Up Instructions: No follow-ups on file.   Earnestine Mealing, CMA  Note - This record has been created using Editor, commissioning.  Chart creation errors have been sought, but may not always  have been located. Such creation errors do not reflect on  the standard of medical care.

## 2021-01-03 ENCOUNTER — Ambulatory Visit: Payer: Medicare Other | Admitting: Physician Assistant

## 2021-01-03 DIAGNOSIS — G2581 Restless legs syndrome: Secondary | ICD-10-CM

## 2021-01-03 DIAGNOSIS — Z8639 Personal history of other endocrine, nutritional and metabolic disease: Secondary | ICD-10-CM

## 2021-01-03 DIAGNOSIS — M62838 Other muscle spasm: Secondary | ICD-10-CM

## 2021-01-03 DIAGNOSIS — M7061 Trochanteric bursitis, right hip: Secondary | ICD-10-CM

## 2021-01-03 DIAGNOSIS — M19041 Primary osteoarthritis, right hand: Secondary | ICD-10-CM

## 2021-01-03 DIAGNOSIS — Z8719 Personal history of other diseases of the digestive system: Secondary | ICD-10-CM

## 2021-01-03 DIAGNOSIS — Z8669 Personal history of other diseases of the nervous system and sense organs: Secondary | ICD-10-CM

## 2021-01-03 DIAGNOSIS — Z8659 Personal history of other mental and behavioral disorders: Secondary | ICD-10-CM

## 2021-01-03 DIAGNOSIS — R5383 Other fatigue: Secondary | ICD-10-CM

## 2021-01-03 DIAGNOSIS — G4709 Other insomnia: Secondary | ICD-10-CM

## 2021-01-03 DIAGNOSIS — Q667 Congenital pes cavus, unspecified foot: Secondary | ICD-10-CM

## 2021-01-03 DIAGNOSIS — M797 Fibromyalgia: Secondary | ICD-10-CM

## 2021-01-03 DIAGNOSIS — M79671 Pain in right foot: Secondary | ICD-10-CM

## 2021-01-20 NOTE — Progress Notes (Signed)
Office Visit Note  Patient: Felicia Rivera             Date of Birth: 04/12/1971           MRN: [id number]             PCP: Donald Prose, MD Referring: Donald Prose, MD Visit Date: 02/02/2021 Occupation: @GUAROCC @  Subjective:  Muscle tenderness   History of Present Illness: Felicia Rivera is a 50 y.o. female with history of osteoarthritis and fibromyalgia.  Patient presents today having a fibromyalgia flare.  She is having generalized myalgias and muscle tenderness.  She states that she has been acting as a caregiver for both of her parents which has caused a tremendous amount of stress.  She has been having to go back and forth from [place] to [place] and has been staying with them most nights.  The bed at their house is very uncomfortable which has also been exacerbating her pain and causing interrupted sleep at night.  She is having significant pain and stiffness in her neck which feels muscular in origin.  She has also been having some tension headaches due to the muscle tenderness and tightness in her trapezius.  She has been experiencing muscle spasms more frequently.  She would like a refill of baclofen which has been effective in the past at managing her symptoms.  She states that she has been having to use her TENS unit more frequently when she is in [place] due to the severity of her pain.  She has also been using Biofreeze topically as needed for pain relief.    Activities of Daily Living:  Patient reports morning stiffness for 15-30 minutes.   Patient Reports nocturnal pain.  Difficulty dressing/grooming: Denies Difficulty climbing stairs: Denies Difficulty getting out of chair: Denies Difficulty using hands for taps, buttons, cutlery, and/or writing: Denies  Review of Systems  Constitutional:  Positive for fatigue.  HENT:  Positive for mouth dryness and nose dryness. Negative for mouth sores.   Eyes:  Negative for pain, itching and dryness.  Respiratory:  Positive  for shortness of breath. Negative for difficulty breathing.   Cardiovascular:  Negative for chest pain and palpitations.  Gastrointestinal:  Negative for blood in stool, constipation and diarrhea.  Endocrine: Negative for increased urination.  Genitourinary:  Negative for difficulty urinating.  Musculoskeletal:  Positive for joint pain, joint pain, myalgias, morning stiffness, muscle tenderness and myalgias. Negative for joint swelling.  Skin:  Negative for color change, rash and redness.  Allergic/Immunologic: Negative for susceptible to infections.  Neurological:  Positive for numbness, headaches and weakness. Negative for dizziness and memory loss.  Hematological:  Positive for bruising/bleeding tendency.  Psychiatric/Behavioral:  Negative for confusion. The patient is nervous/anxious.    PMFS History:  Patient Active Problem List   Diagnosis Date Noted   Other fatigue 07/25/2016   Other insomnia 07/25/2016   Bilateral sacroiliitis ([place]) 07/25/2016   Trochanteric bursitis of both hips 07/25/2016   Fibromyalgia 02/28/2016   Ulnar neuropathy at elbow, left 02/28/2016   Bipolar disorder ([place]) 02/28/2016   Vitamin D deficiency 02/28/2016   Migraine 02/28/2016   RLS (restless legs syndrome) 02/28/2016   IBS (irritable bowel syndrome) 02/28/2016   GERD (gastroesophageal reflux disease) 02/28/2016    Past Medical History:  Diagnosis Date   Bipolar 1 disorder ([place]) 02/28/2016   Bipolar disorder ([place]) 02/28/2016   Bursitis    Fibromyalgia 02/28/2016   GERD (gastroesophageal reflux disease) 02/28/2016   IBS (irritable bowel  syndrome) 02/28/2016   Migraine 02/28/2016   RLS (restless legs syndrome) 02/28/2016   Ulnar neuropathy at elbow, left 02/28/2016   Vitamin D deficiency 02/28/2016    Family History  Problem Relation Age of Onset   Kidney failure Mother        Stage 3   Diabetes Mother    Glaucoma Mother    Heart Problems Mother        pacemaker, defibrilator     Hypertension Mother    Congestive Heart Failure Mother    Rheum arthritis Mother        possibly   COPD Mother    Emphysema Mother    Hypertension Father    High Cholesterol Father    Heart Problems Father    Leukemia Father    Past Surgical History:  Procedure Laterality Date   ABDOMINAL HYSTERECTOMY     dental work  04/16/2017   x2   Social History   Social History Narrative   Left handed   Immunization History  Administered Date(s) Administered   PFIZER(Purple Top)SARS-COV-2 Vaccination 07/18/2019, 08/08/2019, 02/09/2020     Objective: Vital Signs: BP 115/78 (BP Location: Left Arm, Patient Position: Sitting, Cuff Size: Small)   Pulse 90   Resp 13   Ht 5\' 4"  (1.626 m)   Wt 109 lb 12.8 oz (49.8 kg)   BMI 18.85 kg/m    Physical Exam Vitals and nursing note reviewed.  Constitutional:      Appearance: She is well-developed.  HENT:     Head: Normocephalic and atraumatic.  Eyes:     Conjunctiva/sclera: Conjunctivae normal.  Pulmonary:     Effort: Pulmonary effort is normal.  Abdominal:     Palpations: Abdomen is soft.  Musculoskeletal:     Cervical back: Normal range of motion.  Skin:    General: Skin is warm and dry.     Capillary Refill: Capillary refill takes less than 2 seconds.  Neurological:     Mental Status: She is alert and oriented to person, place, and time.  Psychiatric:        Behavior: Behavior normal.     Musculoskeletal Exam: Generalized hyperalgesia and positive tender points.  C-spine has limited range of motion with lateral rotation especially to the right.  Trapezius muscle tension and tenderness bilaterally.  Thoracic and lumbar spine have good range of motion.  Shoulder joints have painful range of motion bilaterally.  Elbow joints, wrist joints, MCPs, PIPs, DIPs have good range of motion with no synovitis.  Mild DIP prominence consistent with early osteoarthritic changes noted.  Complete fist formation bilaterally.  Hip joints have good  range of motion with tenderness over bilateral trochanteric bursa.  Knee joints have good range of motion with no warmth or effusion.  Ankle joints have good range of motion with no tenderness or joint swelling.  CDAI Exam: CDAI Score: -- Patient Global: --; Provider Global: -- Swollen: --; Tender: -- Joint Exam 02/02/2021   No joint exam has been documented for this visit   There is currently no information documented on the homunculus. Go to the Rheumatology activity and complete the homunculus joint exam.  Investigation: No additional findings.  Imaging: No results found.  Recent Labs: Lab Results  Component Value Date   WBC 9.3 03/21/2020   HGB 13.1 03/21/2020   PLT 219 03/21/2020   NA 137 03/21/2020   K 3.1 (L) 03/21/2020   CL 102 03/21/2020   CO2 23 03/21/2020   GLUCOSE 136 (H)  03/21/2020   BUN 20 03/21/2020   CREATININE 1.13 (H) 03/21/2020   BILITOT 0.4 03/21/2020   ALKPHOS 43 03/21/2020   AST 24 03/21/2020   ALT 27 03/21/2020   PROT 6.9 03/21/2020   ALBUMIN 3.9 03/21/2020   CALCIUM 9.3 03/21/2020   GFRAA 51 (L) 07/31/2016    Speciality Comments: No specialty comments available.  Procedures:  No procedures performed Allergies: Cogentin [benztropine], Erythromycin, Hydrocodone, Lyrica [pregabalin], Nsaids, and Omnicef [cefdinir]   Assessment / Plan:     Visit Diagnoses: Primary osteoarthritis of both hands: She has mild DIP prominence consistent with osteoarthritis of both hands.  No clinical features of rheumatoid arthritis noted.  No tenderness or inflammation was noted on examination today.  She was able to make a complete fist bilaterally.  Discussed the importance of joint protection and muscle strengthening.  Trochanteric bursitis of both hips: She has tenderness over bilateral trochanteric bursa.  Discussed the importance of performing stretching exercises on a daily basis.  Pes cavus - Bilateral.   Fibromyalgia: She has generalized hyperalgesia and  positive tender points on examination today.  She is currently having a fibromyalgia flare.  She has significant trapezius muscle tension and tenderness bilaterally.  She has been experiencing muscle spasms intermittently.  A refill of baclofen 10 mg 1 tablet daily as needed for muscle spasms was sent to the pharmacy.  A referral to pain management was declined today. She has been under tremendous amount of stress acting as a caregiver for both of her parents.  She has been having to drive back and forth from [facility] to [place] which has also been stressful.  Discussed the importance of stress management and self-care. Discussed the importance of regular exercise and good sleep hygiene.  She was advised to notify us if she continues to have recurrent flares.  Trapezius muscle spasm - She is experiencing trapezius muscle tension and tenderness bilaterally.  She has been experiencing muscle spasms intermittently.  She declined trigger point injections today.  Other fatigue: Chronic and secondary to insomnia.  Discussed importance of regular exercise.  She continues to dance on a regular basis for exercise.  Other insomnia -She has been experiencing interrupted sleep at night due to nocturnal pain.  She has been sleeping at her parents house on a regular basis and wakes up frequently due to nocturnal pain as well as concern for her parents.  Discussed the importance of good sleep hygiene.    History of migraine - She was evaluated by Dr. Tomi Likens.She takes Topamax 25 mg 2 tablets by mouth at bedtime for migraines.   Other medical conditions are listed as follows:  History of bipolar disorder  History of vitamin D deficiency  RLS (restless legs syndrome)  History of IBS  History of gastroesophageal reflux (GERD)  Orders: No orders of the defined types were placed in this encounter.  Meds ordered this encounter  Medications   baclofen (LIORESAL) 10 MG tablet    Sig: Take 1 tablet (10 mg  total) by mouth at bedtime as needed for muscle spasms.    Dispense:  30 tablet    Refill:  0      Follow-Up Instructions: Return in about 6 months (around 08/03/2021) for Fibromyalgia, Osteoarthritis.   [name], PA-C  Note - This record has been created using Dragon software.  Chart creation errors have been sought, but may not always  have been located. Such creation errors do not reflect on  the standard of medical care.

## 2021-02-02 ENCOUNTER — Encounter: Payer: Self-pay | Admitting: Physician Assistant

## 2021-02-02 ENCOUNTER — Ambulatory Visit (INDEPENDENT_AMBULATORY_CARE_PROVIDER_SITE_OTHER): Payer: Medicare Other | Admitting: Physician Assistant

## 2021-02-02 ENCOUNTER — Other Ambulatory Visit: Payer: Self-pay

## 2021-02-02 VITALS — BP 115/78 | HR 90 | Resp 13 | Ht 64.0 in | Wt 109.8 lb

## 2021-02-02 DIAGNOSIS — G2581 Restless legs syndrome: Secondary | ICD-10-CM | POA: Diagnosis not present

## 2021-02-02 DIAGNOSIS — R5383 Other fatigue: Secondary | ICD-10-CM

## 2021-02-02 DIAGNOSIS — M19041 Primary osteoarthritis, right hand: Secondary | ICD-10-CM

## 2021-02-02 DIAGNOSIS — Q667 Congenital pes cavus, unspecified foot: Secondary | ICD-10-CM | POA: Diagnosis not present

## 2021-02-02 DIAGNOSIS — M7061 Trochanteric bursitis, right hip: Secondary | ICD-10-CM | POA: Diagnosis not present

## 2021-02-02 DIAGNOSIS — Z8669 Personal history of other diseases of the nervous system and sense organs: Secondary | ICD-10-CM | POA: Diagnosis not present

## 2021-02-02 DIAGNOSIS — M797 Fibromyalgia: Secondary | ICD-10-CM | POA: Diagnosis not present

## 2021-02-02 DIAGNOSIS — G4709 Other insomnia: Secondary | ICD-10-CM | POA: Diagnosis not present

## 2021-02-02 DIAGNOSIS — M7062 Trochanteric bursitis, left hip: Secondary | ICD-10-CM

## 2021-02-02 DIAGNOSIS — Z8639 Personal history of other endocrine, nutritional and metabolic disease: Secondary | ICD-10-CM

## 2021-02-02 DIAGNOSIS — Z8719 Personal history of other diseases of the digestive system: Secondary | ICD-10-CM

## 2021-02-02 DIAGNOSIS — Z8659 Personal history of other mental and behavioral disorders: Secondary | ICD-10-CM | POA: Diagnosis not present

## 2021-02-02 DIAGNOSIS — M19042 Primary osteoarthritis, left hand: Secondary | ICD-10-CM

## 2021-02-02 DIAGNOSIS — M62838 Other muscle spasm: Secondary | ICD-10-CM

## 2021-02-02 DIAGNOSIS — M79671 Pain in right foot: Secondary | ICD-10-CM

## 2021-02-02 MED ORDER — BACLOFEN 10 MG PO TABS: 10.0000 mg | ORAL_TABLET | Freq: Every evening | ORAL | 0 refills | Status: DC | PRN

## 2021-02-19 ENCOUNTER — Other Ambulatory Visit: Payer: Self-pay | Admitting: Physician Assistant

## 2021-02-20 NOTE — Telephone Encounter (Signed)
Next Visit: 08/03/2021  Last Visit: 02/02/2021  Last Fill: 11/16/2020  Dx: History of migraine  Current Dose per office note on 02/02/2021: Topamax 25 mg 2 tablets by mouth at bedtime for migraines.   Okay to refill Topiramate?

## 2021-02-20 NOTE — Telephone Encounter (Signed)
Patient advised there is a warning for possible drug interaction between Topamax and Lamictal.  Topamax can enhance the arrhythmogenic effect of Lamictal and can also increase the elimination of Lamictal.     Patient states she is on the same dose of Lamictal 200 mg and has been for multiple years.  Patient states Dr. Pauline Good is prescribing her prescription for Lamictal. It appears she has had several EKGs in the past.   Patient is aware of these potential adverse events.

## 2021-03-01 ENCOUNTER — Other Ambulatory Visit: Payer: Self-pay | Admitting: Physician Assistant

## 2021-03-01 NOTE — Telephone Encounter (Signed)
Next Visit: 08/03/2021  Last Visit: 02/02/2021  Last Fill: 02/02/2021  Dx: Fibromyalgia  Current Dose per office note on 02/02/2021: baclofen 10 mg 1 tablet daily as needed for muscle spasms   Okay to refill Baclofen?

## 2021-03-07 DIAGNOSIS — J310 Chronic rhinitis: Secondary | ICD-10-CM | POA: Diagnosis not present

## 2021-03-07 DIAGNOSIS — J4540 Moderate persistent asthma, uncomplicated: Secondary | ICD-10-CM | POA: Diagnosis not present

## 2021-03-07 DIAGNOSIS — J300 Vasomotor rhinitis: Secondary | ICD-10-CM | POA: Diagnosis not present

## 2021-03-07 DIAGNOSIS — H1045 Other chronic allergic conjunctivitis: Secondary | ICD-10-CM | POA: Diagnosis not present

## 2021-03-09 DIAGNOSIS — Z23 Encounter for immunization: Secondary | ICD-10-CM | POA: Diagnosis not present

## 2021-03-16 DIAGNOSIS — F411 Generalized anxiety disorder: Secondary | ICD-10-CM | POA: Diagnosis not present

## 2021-03-16 DIAGNOSIS — F3132 Bipolar disorder, current episode depressed, moderate: Secondary | ICD-10-CM | POA: Diagnosis not present

## 2021-05-15 ENCOUNTER — Other Ambulatory Visit: Payer: Self-pay | Admitting: Physician Assistant

## 2021-05-15 NOTE — Telephone Encounter (Signed)
Next Visit: 08/03/2021   Last Visit: 02/02/2021   Last Fill: 02/20/2021   Dx: History of migraine   Current Dose per office note on 02/02/2021: Topamax 25 mg 2 tablets by mouth at bedtime for migraines.    Okay to refill Topiramate?

## 2021-05-16 DIAGNOSIS — F3132 Bipolar disorder, current episode depressed, moderate: Secondary | ICD-10-CM | POA: Diagnosis not present

## 2021-05-16 DIAGNOSIS — F411 Generalized anxiety disorder: Secondary | ICD-10-CM | POA: Diagnosis not present

## 2021-05-28 ENCOUNTER — Other Ambulatory Visit: Payer: Self-pay | Admitting: Physician Assistant

## 2021-05-29 NOTE — Telephone Encounter (Signed)
Next Visit: 08/03/2021   Last Visit: 02/02/2021   Last Fill: 03/01/2021   Dx: Fibromyalgia   Current Dose per office note on 02/02/2021: baclofen 10 mg 1 tablet daily as needed for muscle spasms    Okay to refill Baclofen?

## 2021-06-20 DIAGNOSIS — U071 COVID-19: Secondary | ICD-10-CM | POA: Diagnosis not present

## 2021-06-29 DIAGNOSIS — N9089 Other specified noninflammatory disorders of vulva and perineum: Secondary | ICD-10-CM | POA: Diagnosis not present

## 2021-07-11 DIAGNOSIS — F3132 Bipolar disorder, current episode depressed, moderate: Secondary | ICD-10-CM | POA: Diagnosis not present

## 2021-07-11 DIAGNOSIS — F411 Generalized anxiety disorder: Secondary | ICD-10-CM | POA: Diagnosis not present

## 2021-07-19 NOTE — Progress Notes (Signed)
? ?Office Visit Note ? ?Patient: Felicia Rivera             ?Date of Birth: [birth year]           ?MRN: [id number]             ?PCP: Donald Prose, MD ?Referring: Donald Prose, MD ?Visit Date: 08/02/2021 ?Occupation: '@GUAROCC'$ @ ? ?Subjective:  ?Arthritis (Aching, not doing well) ? ? ?History of Present Illness: Felicia Rivera is a 51 y.o. female with history of fibromyalgia and osteoarthritis.  She continues to have pain and discomfort in her bilateral hands.  She has noticed more knots on her hands.  She also has off-and-on discomfort in her trochanteric bursa.  She states climbing stairs is difficult for her.  She has been having flare of fibromyalgia with generalized pain and discomfort especially with the weather change.  She had a fall in August 2022 as she tripped on a stair.  She had some discomfort in the right rib cage which eventually resolved.  She has been experiencing discomfort in her right third toe and midfoot since last night. ? ?Activities of Daily Living:  ?Patient reports morning stiffness for 15 minutes.   ?Patient Reports nocturnal pain.  ?Difficulty dressing/grooming: Denies ?Difficulty climbing stairs: Denies ?Difficulty getting out of chair: Denies ?Difficulty using hands for taps, buttons, cutlery, and/or writing: Reports ? ?Review of Systems  ?Constitutional:  Positive for fatigue and night sweats.  ?HENT:  Positive for mouth dryness.   ?Eyes:  Positive for dryness.  ?Respiratory:  Negative for difficulty breathing.   ?Cardiovascular:  Negative for swelling in legs/feet.  ?Gastrointestinal:  Positive for constipation.  ?Endocrine: Positive for heat intolerance.  ?Genitourinary:  Negative for difficulty urinating.  ?Musculoskeletal:  Positive for joint pain, joint pain, joint swelling, muscle weakness, morning stiffness and muscle tenderness.  ?Skin:  Negative for color change, rash and sensitivity to sunlight.  ?Allergic/Immunologic: Negative for susceptible to infections.  ?Neurological:   Positive for numbness, night sweats and weakness.  ?Hematological:  Positive for bruising/bleeding tendency. Negative for swollen glands.  ?Psychiatric/Behavioral:  Positive for depressed mood and sleep disturbance. The patient is nervous/anxious.   ? ?PMFS History:  ?Patient Active Problem List  ? Diagnosis Date Noted  ? Other fatigue 07/25/2016  ? Other insomnia 07/25/2016  ? Bilateral sacroiliitis ([place]) 07/25/2016  ? Trochanteric bursitis of both hips 07/25/2016  ? Fibromyalgia 02/28/2016  ? Ulnar neuropathy at elbow, left 02/28/2016  ? Bipolar disorder ([place]) 02/28/2016  ? Vitamin D deficiency 02/28/2016  ? Migraine 02/28/2016  ? RLS (restless legs syndrome) 02/28/2016  ? IBS (irritable bowel syndrome) 02/28/2016  ? GERD (gastroesophageal reflux disease) 02/28/2016  ?  ?Past Medical History:  ?Diagnosis Date  ? Arthritis   ? Bipolar 1 disorder ([place]) 02/28/2016  ? Bipolar disorder ([place]) 02/28/2016  ? Bursitis   ? Fibromyalgia 02/28/2016  ? GERD (gastroesophageal reflux disease) 02/28/2016  ? IBS (irritable bowel syndrome) 02/28/2016  ? Migraine 02/28/2016  ? Periodic limb movement disorder (PLMD)   ? RLS (restless legs syndrome) 02/28/2016  ? Ulnar neuropathy at elbow, left 02/28/2016  ? Vitamin D deficiency 02/28/2016  ?  ?Family History  ?Problem Relation Age of Onset  ? Kidney failure Mother   ?     Stage 3  ? Diabetes Mother   ? Glaucoma Mother   ? Heart Problems Mother   ?     pacemaker, defibrilator   ? Hypertension Mother   ? Congestive Heart Failure Mother   ?  Rheum arthritis Mother   ?     possibly  ? COPD Mother   ? Emphysema Mother   ? Hypertension Father   ? High Cholesterol Father   ? Heart Problems Father   ? Leukemia Father   ? Acute myelogenous leukemia Father   ? ?Past Surgical History:  ?Procedure Laterality Date  ? ABDOMINAL HYSTERECTOMY    ? dental work  04/16/2017  ? x2  ? ?Social History  ? ?Social History Narrative  ? Left handed  ? ?Immunization History  ?Administered Date(s) Administered   ? PFIZER(Purple Top)SARS-COV-2 Vaccination 07/18/2019, 08/08/2019, 02/09/2020  ?  ? ?Objective: ?Vital Signs: BP 101/66 (BP Location: Left Arm, Patient Position: Sitting, Cuff Size: Normal)   Pulse (!) 114   Resp 14   Ht '5\' 4"'$  (1.626 m)   Wt 112 lb (50.8 kg)   BMI 19.22 kg/m?   ? ?Physical Exam ?Vitals and nursing note reviewed.  ?Constitutional:   ?   Appearance: She is well-developed.  ?HENT:  ?   Head: Normocephalic and atraumatic.  ?Eyes:  ?   Conjunctiva/sclera: Conjunctivae normal.  ?Cardiovascular:  ?   Rate and Rhythm: Normal rate and regular rhythm.  ?   Heart sounds: Normal heart sounds.  ?Pulmonary:  ?   Effort: Pulmonary effort is normal.  ?   Breath sounds: Normal breath sounds.  ?Abdominal:  ?   General: Bowel sounds are normal.  ?   Palpations: Abdomen is soft.  ?Musculoskeletal:  ?   Cervical back: Normal range of motion.  ?Lymphadenopathy:  ?   Cervical: No cervical adenopathy.  ?Skin: ?   General: Skin is warm and dry.  ?   Capillary Refill: Capillary refill takes less than 2 seconds.  ?Neurological:  ?   Mental Status: She is alert and oriented to person, place, and time.  ?Psychiatric:     ?   Behavior: Behavior normal.  ?  ? ?Musculoskeletal Exam: C-spine thoracic and lumbar spine were in good range of motion.  Shoulder joints, elbow joints, wrist joints, MCPs PIPs and DIPs with good range of motion.  She had bilateral PIP and DIP thickening.  Hip joints and knee joints in good range of motion.  There was no tenderness over ankles or MTPs.  I did not notice any swelling or tenderness over right third toe. ? ?CDAI Exam: ?CDAI Score: -- ?Patient Global: --; Provider Global: -- ?Swollen: --; Tender: -- ?Joint Exam 08/02/2021  ? ?No joint exam has been documented for this visit  ? ?There is currently no information documented on the homunculus. Go to the Rheumatology activity and complete the homunculus joint exam. ? ?Investigation: ?No additional findings. ? ?Imaging: ?No results  found. ? ?Recent Labs: ?Lab Results  ?Component Value Date  ? WBC 9.3 03/21/2020  ? HGB 13.1 03/21/2020  ? PLT 219 03/21/2020  ? NA 137 03/21/2020  ? K 3.1 (L) 03/21/2020  ? CL 102 03/21/2020  ? CO2 23 03/21/2020  ? GLUCOSE 136 (H) 03/21/2020  ? BUN 20 03/21/2020  ? CREATININE 1.13 (H) 03/21/2020  ? BILITOT 0.4 03/21/2020  ? ALKPHOS 43 03/21/2020  ? AST 24 03/21/2020  ? ALT 27 03/21/2020  ? PROT 6.9 03/21/2020  ? ALBUMIN 3.9 03/21/2020  ? CALCIUM 9.3 03/21/2020  ? GFRAA 51 (L) 07/31/2016  ? ? ?Speciality Comments: No specialty comments available. ? ?Procedures:  ?No procedures performed ?Allergies: Benztropine, Cefdinir, Clarithromycin, Erythromycin, Hydrocodone, Nsaids, and Pregabalin  ? ?Assessment / Plan:     ?  Visit Diagnoses: Primary osteoarthritis of both hands-bilateral PIP and DIP thickening was noted.  No synovitis was noted.  Joint protection muscle strengthening was discussed. ? ?Trochanteric bursitis of both hips-she continues to have some discomfort in the trochanteric region.  This pain gets worse when she climbs the stairs.  IT band stretches were discussed. ? ?Pain in right foot-she complains of discomfort in her right third toe since last night.  No warmth, swelling or redness was noted.  Proper fitting shoes were discussed.  She dances and practicing tap dancing. ? ?Pes cavus - Bilateral.  ? ?Fibromyalgia -she has been having a flare of fibromyalgia with weather change.  She takes baclofen 10 mg 1 tablet daily only occasionally. ? ?Trapezius muscle spasm-she has been under a lot of stress and having trapezius spasm.  Stretching exercises were advised. ? ?Other fatigue-related to fibromyalgia and insomnia. ? ?Other insomnia-good sleep hygiene was discussed. ? ?History of migraine - She was evaluated by Dr. Tomi Likens.She takes Topamax 25 mg 2 tablets by mouth at bedtime for migraines.  ? ?RLS (restless legs syndrome) ? ?History of vitamin D deficiency ? ?History of bipolar disorder-she states her anxiety  and depression is worse due to her father's illness.  Her father is going under chemotherapy for AML. ? ?History of gastroesophageal reflux (GERD)-reflux precautions were discussed. ? ?History of IBS ? ?Orders: ?No orders o

## 2021-08-02 ENCOUNTER — Ambulatory Visit (INDEPENDENT_AMBULATORY_CARE_PROVIDER_SITE_OTHER): Payer: Medicare Other | Admitting: Rheumatology

## 2021-08-02 ENCOUNTER — Encounter: Payer: Self-pay | Admitting: Rheumatology

## 2021-08-02 VITALS — BP 101/66 | HR 114 | Resp 14 | Ht 64.0 in | Wt 112.0 lb

## 2021-08-02 DIAGNOSIS — M19041 Primary osteoarthritis, right hand: Secondary | ICD-10-CM

## 2021-08-02 DIAGNOSIS — Q667 Congenital pes cavus, unspecified foot: Secondary | ICD-10-CM | POA: Diagnosis not present

## 2021-08-02 DIAGNOSIS — M19042 Primary osteoarthritis, left hand: Secondary | ICD-10-CM

## 2021-08-02 DIAGNOSIS — Z8659 Personal history of other mental and behavioral disorders: Secondary | ICD-10-CM | POA: Diagnosis not present

## 2021-08-02 DIAGNOSIS — R5383 Other fatigue: Secondary | ICD-10-CM

## 2021-08-02 DIAGNOSIS — M79671 Pain in right foot: Secondary | ICD-10-CM

## 2021-08-02 DIAGNOSIS — G2581 Restless legs syndrome: Secondary | ICD-10-CM

## 2021-08-02 DIAGNOSIS — Z8669 Personal history of other diseases of the nervous system and sense organs: Secondary | ICD-10-CM

## 2021-08-02 DIAGNOSIS — M7061 Trochanteric bursitis, right hip: Secondary | ICD-10-CM

## 2021-08-02 DIAGNOSIS — G4709 Other insomnia: Secondary | ICD-10-CM

## 2021-08-02 DIAGNOSIS — Z8719 Personal history of other diseases of the digestive system: Secondary | ICD-10-CM

## 2021-08-02 DIAGNOSIS — M7062 Trochanteric bursitis, left hip: Secondary | ICD-10-CM

## 2021-08-02 DIAGNOSIS — M797 Fibromyalgia: Secondary | ICD-10-CM | POA: Diagnosis not present

## 2021-08-02 DIAGNOSIS — M62838 Other muscle spasm: Secondary | ICD-10-CM

## 2021-08-02 DIAGNOSIS — Z8639 Personal history of other endocrine, nutritional and metabolic disease: Secondary | ICD-10-CM

## 2021-08-02 NOTE — Patient Instructions (Signed)
Knee Exercises ?Ask your health care provider which exercises are safe for you. Do exercises exactly as told by your health care provider and adjust them as directed. It is normal to feel mild stretching, pulling, tightness, or discomfort as you do these exercises. Stop right away if you feel sudden pain or your pain gets worse. Do not begin these exercises until told by your health care provider. ?Stretching and range-of-motion exercises ?These exercises warm up your muscles and joints and improve the movement and flexibility of your knee. These exercises also help to relieve pain and swelling. ?Knee extension, prone ? ?Lie on your abdomen (prone position) on a bed. ?Place your left / right knee just beyond the edge of the surface so your knee is not on the bed. You can put a towel under your left / right thigh just above your kneecap for comfort. ?Relax your leg muscles and allow gravity to straighten your knee (extension). You should feel a stretch behind your left / right knee. ?Hold this position for __________ seconds. ?Scoot up so your knee is supported between repetitions. ?Repeat __________ times. Complete this exercise __________ times a day. ?Knee flexion, active ? ?Lie on your back with both legs straight. If this causes back discomfort, bend your left / right knee so your foot is flat on the floor. ?Slowly slide your left / right heel back toward your buttocks. Stop when you feel a gentle stretch in the front of your knee or thigh (flexion). ?Hold this position for __________ seconds. ?Slowly slide your left / right heel back to the starting position. ?Repeat __________ times. Complete this exercise __________ times a day. ?Quadriceps stretch, prone ? ?Lie on your abdomen on a firm surface, such as a bed or padded floor. ?Bend your left / right knee and hold your ankle. If you cannot reach your ankle or pant leg, loop a belt around your foot and grab the belt instead. ?Gently pull your heel toward your  buttocks. Your knee should not slide out to the side. You should feel a stretch in the front of your thigh and knee (quadriceps). ?Hold this position for __________ seconds. ?Repeat __________ times. Complete this exercise __________ times a day. ?Hamstring, supine ? ?Lie on your back (supine position). ?Loop a belt or towel over the ball of your left / right foot. The ball of your foot is on the walking surface, right under your toes. ?Straighten your left / right knee and slowly pull on the belt to raise your leg until you feel a gentle stretch behind your knee (hamstring). ?Do not let your knee bend while you do this. ?Keep your other leg flat on the floor. ?Hold this position for __________ seconds. ?Repeat __________ times. Complete this exercise __________ times a day. ?Strengthening exercises ?These exercises build strength and endurance in your knee. Endurance is the ability to use your muscles for a long time, even after they get tired. ?Quadriceps, isometric ?This exercise strengthens the muscles in front of your thigh (quadriceps) without moving your knee joint (isometric). ?Lie on your back with your left / right leg extended and your other knee bent. Put a rolled towel or small pillow under your knee if told by your health care provider. ?Slowly tense the muscles in the front of your left / right thigh. You should see your kneecap slide up toward your hip or see increased dimpling just above the knee. This motion will push the back of the knee toward the floor. ?  For __________ seconds, hold the muscle as tight as you can without increasing your pain. ?Relax the muscles slowly and completely. ?Repeat __________ times. Complete this exercise __________ times a day. ?Straight leg raises ?This exercise strengthens the muscles in front of your thigh (quadriceps) and the muscles that move your hips (hip flexors). ?Lie on your back with your left / right leg extended and your other knee bent. ?Tense the  muscles in the front of your left / right thigh. You should see your kneecap slide up or see increased dimpling just above the knee. Your thigh may even shake a bit. ?Keep these muscles tight as you raise your leg 4-6 inches (10-15 cm) off the floor. Do not let your knee bend. ?Hold this position for __________ seconds. ?Keep these muscles tense as you lower your leg. ?Relax your muscles slowly and completely after each repetition. ?Repeat __________ times. Complete this exercise __________ times a day. ?Hamstring, isometric ? ?Lie on your back on a firm surface. ?Bend your left / right knee about __________ degrees. ?Dig your left / right heel into the surface as if you are trying to pull it toward your buttocks. Tighten the muscles in the back of your thighs (hamstring) to "dig" as hard as you can without increasing any pain. ?Hold this position for __________ seconds. ?Release the tension gradually and allow your muscles to relax completely for __________ seconds after each repetition. ?Repeat __________ times. Complete this exercise __________ times a day. ?Hamstring curls ?If told by your health care provider, do this exercise while wearing ankle weights. Begin with __________lb / kg weights. Then increase the weight by 1 lb (0.5 kg) increments. Do not wear ankle weights that are more than __________lb / kg. ?Lie on your abdomen with your legs straight. ?Bend your left / right knee as far as you can without feeling pain. Keep your hips flat against the floor. ?Hold this position for __________ seconds. ?Slowly lower your leg to the starting position. ?Repeat __________ times. Complete this exercise __________ times a day. ?Squats ?This exercise strengthens the muscles in front of your thigh and knee (quadriceps). ?Stand in front of a table, with your feet and knees pointing straight ahead. You may rest your hands on the table for balance but not for support. ?Slowly bend your knees and lower your hips like you  are going to sit in a chair. ?Keep your weight over your heels, not over your toes. ?Keep your lower legs upright so they are parallel with the table legs. ?Do not let your hips go lower than your knees. ?Do not bend lower than told by your health care provider. ?If your knee pain increases, do not bend as low. ?Hold the squat position for __________ seconds. ?Slowly push with your legs to return to standing. Do not use your hands to pull yourself to standing. ?Repeat __________ times. Complete this exercise __________ times a day. ?Wall slides ?This exercise strengthens the muscles in front of your thigh and knee (quadriceps). ?Lean your back against a smooth wall or door, and walk your feet out 18-24 inches (46-61 cm) from it. ?Place your feet hip-width apart. ?Slowly slide down the wall or door until your knees bend __________ degrees. Keep your knees over your heels, not over your toes. Keep your knees in line with your hips. ?Hold this position for __________ seconds. ?Repeat __________ times. Complete this exercise __________ times a day. ?Straight leg raises, side-lying ?This exercise strengthens the muscles that rotate   the leg at the hip and move it away from your body (hip abductors). ?Lie on your side with your left / right leg in the top position. Lie so your head, shoulder, knee, and hip line up. You may bend your bottom knee to help you keep your balance. ?Roll your hips slightly forward so your hips are stacked directly over each other and your left / right knee is facing forward. ?Leading with your heel, lift your top leg 4-6 inches (10-15 cm). You should feel the muscles in your outer hip lifting. ?Do not let your foot drift forward. ?Do not let your knee roll toward the ceiling. ?Hold this position for __________ seconds. ?Slowly return your leg to the starting position. ?Let your muscles relax completely after each repetition. ?Repeat __________ times. Complete this exercise __________ times a  day. ?Straight leg raises, prone ?This exercise stretches the muscles that move your hips away from the front of the pelvis (hip extensors). ?Lie on your abdomen on a firm surface. You can put a pillow under yo

## 2021-08-03 ENCOUNTER — Ambulatory Visit: Payer: Medicare Other | Admitting: Rheumatology

## 2021-08-15 ENCOUNTER — Other Ambulatory Visit: Payer: Self-pay | Admitting: Physician Assistant

## 2021-08-16 NOTE — Telephone Encounter (Signed)
Next Visit: 02/01/2022 ? ?Last Visit: 08/02/2021 ? ?Last Fill: 05/15/2021 ? ?Dx: History of migraine  ? ?Current Dose per office note on 08/02/2021: Topamax 25 mg 2 tablets by mouth at bedtime for migraines ? ?Okay to refill Topamax?   ?

## 2021-08-27 DIAGNOSIS — L601 Onycholysis: Secondary | ICD-10-CM | POA: Diagnosis not present

## 2021-09-03 DIAGNOSIS — L600 Ingrowing nail: Secondary | ICD-10-CM | POA: Diagnosis not present

## 2021-09-19 DIAGNOSIS — Z1272 Encounter for screening for malignant neoplasm of vagina: Secondary | ICD-10-CM | POA: Diagnosis not present

## 2021-09-19 DIAGNOSIS — Z0142 Encounter for cervical smear to confirm findings of recent normal smear following initial abnormal smear: Secondary | ICD-10-CM | POA: Diagnosis not present

## 2021-09-19 DIAGNOSIS — Z1231 Encounter for screening mammogram for malignant neoplasm of breast: Secondary | ICD-10-CM | POA: Diagnosis not present

## 2021-09-19 DIAGNOSIS — Z1211 Encounter for screening for malignant neoplasm of colon: Secondary | ICD-10-CM | POA: Diagnosis not present

## 2021-09-19 DIAGNOSIS — Z1151 Encounter for screening for human papillomavirus (HPV): Secondary | ICD-10-CM | POA: Diagnosis not present

## 2021-09-19 DIAGNOSIS — Z124 Encounter for screening for malignant neoplasm of cervix: Secondary | ICD-10-CM | POA: Diagnosis not present

## 2021-10-17 DIAGNOSIS — H1045 Other chronic allergic conjunctivitis: Secondary | ICD-10-CM | POA: Diagnosis not present

## 2021-10-17 DIAGNOSIS — J4540 Moderate persistent asthma, uncomplicated: Secondary | ICD-10-CM | POA: Diagnosis not present

## 2021-10-17 DIAGNOSIS — J310 Chronic rhinitis: Secondary | ICD-10-CM | POA: Diagnosis not present

## 2021-10-17 DIAGNOSIS — J300 Vasomotor rhinitis: Secondary | ICD-10-CM | POA: Diagnosis not present

## 2021-10-19 DIAGNOSIS — K0889 Other specified disorders of teeth and supporting structures: Secondary | ICD-10-CM | POA: Diagnosis not present

## 2021-10-19 DIAGNOSIS — Z7189 Other specified counseling: Secondary | ICD-10-CM | POA: Diagnosis not present

## 2021-11-15 ENCOUNTER — Other Ambulatory Visit: Payer: Self-pay | Admitting: Rheumatology

## 2021-11-15 NOTE — Telephone Encounter (Signed)
Next Visit: 02/01/2022   Last Visit: 08/02/2021   Last Fill: 08/16/2021   Dx: History of migraine    Current Dose per office note on 08/02/2021: Topamax 25 mg 2 tablets by mouth at bedtime for migraines   Okay to refill Topamax?

## 2021-11-23 DIAGNOSIS — F411 Generalized anxiety disorder: Secondary | ICD-10-CM | POA: Diagnosis not present

## 2021-11-23 DIAGNOSIS — F3132 Bipolar disorder, current episode depressed, moderate: Secondary | ICD-10-CM | POA: Diagnosis not present

## 2021-12-21 DIAGNOSIS — F3132 Bipolar disorder, current episode depressed, moderate: Secondary | ICD-10-CM | POA: Diagnosis not present

## 2021-12-21 DIAGNOSIS — F411 Generalized anxiety disorder: Secondary | ICD-10-CM | POA: Diagnosis not present

## 2021-12-22 ENCOUNTER — Encounter (HOSPITAL_COMMUNITY): Payer: Self-pay

## 2021-12-22 ENCOUNTER — Other Ambulatory Visit: Payer: Self-pay

## 2021-12-22 ENCOUNTER — Emergency Department (HOSPITAL_COMMUNITY)
Admission: EM | Admit: 2021-12-22 | Discharge: 2021-12-22 | Disposition: A | Discharge status: Home / Self Care | Payer: Medicare Other | Attending: Emergency Medicine | Admitting: Emergency Medicine

## 2021-12-22 DIAGNOSIS — R1110 Vomiting, unspecified: Secondary | ICD-10-CM | POA: Diagnosis not present

## 2021-12-22 DIAGNOSIS — R109 Unspecified abdominal pain: Secondary | ICD-10-CM | POA: Diagnosis not present

## 2021-12-22 DIAGNOSIS — E860 Dehydration: Secondary | ICD-10-CM | POA: Diagnosis not present

## 2021-12-22 DIAGNOSIS — R8289 Other abnormal findings on cytological and histological examination of urine: Secondary | ICD-10-CM | POA: Diagnosis not present

## 2021-12-22 DIAGNOSIS — K529 Noninfective gastroenteritis and colitis, unspecified: Secondary | ICD-10-CM

## 2021-12-22 DIAGNOSIS — R1084 Generalized abdominal pain: Secondary | ICD-10-CM | POA: Diagnosis not present

## 2021-12-22 DIAGNOSIS — N390 Urinary tract infection, site not specified: Secondary | ICD-10-CM | POA: Diagnosis not present

## 2021-12-22 DIAGNOSIS — R110 Nausea: Secondary | ICD-10-CM | POA: Diagnosis not present

## 2021-12-22 DIAGNOSIS — R944 Abnormal results of kidney function studies: Secondary | ICD-10-CM | POA: Diagnosis not present

## 2021-12-22 DIAGNOSIS — R112 Nausea with vomiting, unspecified: Secondary | ICD-10-CM

## 2021-12-22 DIAGNOSIS — R000 Tachycardia, unspecified: Secondary | ICD-10-CM | POA: Diagnosis not present

## 2021-12-22 DIAGNOSIS — R300 Dysuria: Secondary | ICD-10-CM | POA: Diagnosis not present

## 2021-12-22 DIAGNOSIS — R1111 Vomiting without nausea: Secondary | ICD-10-CM | POA: Diagnosis not present

## 2021-12-22 LAB — URINALYSIS, ROUTINE W REFLEX MICROSCOPIC
Bilirubin Urine: NEGATIVE
Glucose, UA: NEGATIVE mg/dL
Hgb urine dipstick: NEGATIVE
Ketones, ur: NEGATIVE mg/dL
Nitrite: NEGATIVE
Protein, ur: NEGATIVE mg/dL
Specific Gravity, Urine: 1.005 (ref 1.005–1.030)
pH: 7 (ref 5.0–8.0)

## 2021-12-22 LAB — CBC WITH DIFFERENTIAL/PLATELET
Abs Immature Granulocytes: 0.05 10*3/uL (ref 0.00–0.07)
Basophils Absolute: 0 10*3/uL (ref 0.0–0.1)
Basophils Relative: 0 %
Eosinophils Absolute: 0.1 10*3/uL (ref 0.0–0.5)
Eosinophils Relative: 2 %
HCT: 37.8 % (ref 36.0–46.0)
Hemoglobin: 12.5 g/dL (ref 12.0–15.0)
Immature Granulocytes: 1 %
Lymphocytes Relative: 10 %
Lymphs Abs: 0.8 10*3/uL (ref 0.7–4.0)
MCH: 32.2 pg (ref 26.0–34.0)
MCHC: 33.1 g/dL (ref 30.0–36.0)
MCV: 97.4 fL (ref 80.0–100.0)
Monocytes Absolute: 0.5 10*3/uL (ref 0.1–1.0)
Monocytes Relative: 5 %
Neutro Abs: 7.2 10*3/uL (ref 1.7–7.7)
Neutrophils Relative %: 82 %
Platelets: 177 10*3/uL (ref 150–400)
RBC: 3.88 MIL/uL (ref 3.87–5.11)
RDW: 12.8 % (ref 11.5–15.5)
WBC: 8.7 10*3/uL (ref 4.0–10.5)
nRBC: 0 % (ref 0.0–0.2)

## 2021-12-22 LAB — COMPREHENSIVE METABOLIC PANEL
ALT: 8 U/L (ref 0–44)
AST: 24 U/L (ref 15–41)
Albumin: 3.3 g/dL — ABNORMAL LOW (ref 3.5–5.0)
Alkaline Phosphatase: 44 U/L (ref 38–126)
Anion gap: 6 (ref 5–15)
BUN: 15 mg/dL (ref 6–20)
CO2: 19 mmol/L — ABNORMAL LOW (ref 22–32)
Calcium: 8.1 mg/dL — ABNORMAL LOW (ref 8.9–10.3)
Chloride: 116 mmol/L — ABNORMAL HIGH (ref 98–111)
Creatinine, Ser: 1.17 mg/dL — ABNORMAL HIGH (ref 0.44–1.00)
GFR, Estimated: 57 mL/min — ABNORMAL LOW (ref >60)
Glucose, Bld: 135 mg/dL — ABNORMAL HIGH (ref 70–99)
Potassium: 3.7 mmol/L (ref 3.5–5.1)
Sodium: 141 mmol/L (ref 135–145)
Total Bilirubin: 0.8 mg/dL (ref 0.3–1.2)
Total Protein: 6 g/dL — ABNORMAL LOW (ref 6.5–8.1)

## 2021-12-22 LAB — LIPASE, BLOOD: Lipase: 25 U/L (ref 11–51)

## 2021-12-22 MED ORDER — ONDANSETRON 4 MG PO TBDP: 4.0000 mg | ORAL_TABLET | Freq: Three times a day (TID) | ORAL | 0 refills | Status: DC | PRN

## 2021-12-22 MED ORDER — FLUCONAZOLE 150 MG PO TABS: 150.0000 mg | ORAL_TABLET | Freq: Once | ORAL | 0 refills | Status: AC

## 2021-12-22 MED ORDER — LACTATED RINGERS IV BOLUS
1500.0000 mL | Freq: Once | INTRAVENOUS | Status: AC
  Administered 2021-12-22: 1500 mL via INTRAVENOUS

## 2021-12-22 MED ORDER — CEPHALEXIN 250 MG/5ML PO SUSR: 500.0000 mg | Freq: Two times a day (BID) | ORAL | 0 refills | Status: AC

## 2021-12-22 MED ORDER — LACTATED RINGERS IV BOLUS
1000.0000 mL | Freq: Once | INTRAVENOUS | Status: AC
  Administered 2021-12-22: 1000 mL via INTRAVENOUS

## 2021-12-22 NOTE — Discharge Instructions (Signed)
You have been seen in the Emergency Department (ED)  today for nausea and vomiting.  Your work up today has not shown a clear cause for your symptoms, but they may be due to a viral infection or food poisoning. You have been prescribed Zofran; please use as prescribed as needed for your nausea.  You have also been prescribed Keflex for a UTI.  Follow up with your doctor as soon as possible, ideally within one week, regarding today's emergent visit and your symptoms of nausea/vomiting.   Return to the Emergency Department (ED)  if you develop severe abdominal pain, bloody vomiting, bloody diarrhea, if you are unable to tolerate fluids due to vomiting, or if you develop other symptoms that concern you.

## 2021-12-22 NOTE — ED Triage Notes (Signed)
Arrives EMS from  home after eating chili cheese fries this past Wednesday and felling bad since.   Vomited once ~1hr pta.   20g L ac  '4mg'$  zofran  500cc NS admin pta.

## 2021-12-22 NOTE — ED Provider Notes (Signed)
[facility] DEPT Provider Note   CSN: [id number] Arrival date & time: 12/22/21  0505     History {Add pertinent medical, surgical, social history, OB history to HPI:1} Chief Complaint  Patient presents with   Vomiting    Felicia Rivera is a 51 y.o. female.  51 year old female that presents the ER today secondary to emesis.  Patient states that she ate some cheese fries a few days ago and a few hours later started having diarrhea.  The diarrhea continued for about 24 hours.  Had a lower abdominal cramping but no pain.  No fevers.  No blood.  Patient states that the diarrhea stopped yesterday afternoon but then a couple hours prior to arrival patient had onset of emesis.  Nonbloody nonbilious.  Still no fevers.  Had some abdominal cramping with this.  Does have some dysuria.        Home Medications Prior to Admission medications   Medication Sig Start Date End Date Taking? Authorizing Provider  acetaminophen (TYLENOL) 500 MG tablet Take 500 mg by mouth every 6 (six) hours as needed.    [provider]  albuterol (PROVENTIL HFA;VENTOLIN HFA) 108 (90 Base) MCG/ACT inhaler Inhale into the lungs every 6 (six) hours as needed for wheezing or shortness of breath.    [provider]  Ascorbic Acid (VITAMIN C) 100 MG tablet Take 100 mg by mouth daily.    [provider]  baclofen (LIORESAL) 10 MG tablet TAKE 1 TABLET BY MOUTH THREE TIMES A DAY Patient not taking: Reported on 08/02/2021 05/13/17   Bo Merino, MD  baclofen (LIORESAL) 10 MG tablet TAKE 1 TABLET BY MOUTH AT BEDTIME AS NEEDED FOR MUSCLE SPASMS. 05/29/21   [name], PA-C  Bismuth Subsalicylate (PEPTO-BISMOL PO) Take by mouth as needed.    [provider]  buPROPion (WELLBUTRIN XL) 300 MG 24 hr tablet Take 150 mg by mouth.  12/09/16   [provider]  Calcium Carbonate Antacid (TUMS PO) Take by mouth as needed.    [provider]   cetirizine (ZYRTEC) 10 MG tablet Take 10 mg by mouth daily.    [provider]  clonazePAM (KLONOPIN) 1 MG tablet 2 (two) times daily. 05/15/19   [provider]  DiphenhydrAMINE HCl (BENADRYL PO) Take by mouth as needed.    [provider]  fluticasone (FLONASE) 50 MCG/ACT nasal spray USE 2 SPRAYS IN EACH NOSTRIL ONCE A DAY DURING SEASONS OF DIFFICULTY 90    [provider]  fluticasone-salmeterol (ADVAIR) 250-50 MCG/ACT AEPB 1 puff 10/17/20   [provider]  ipratropium (ATROVENT) 0.03 % nasal spray     [provider]  lamoTRIgine (LAMICTAL) 200 MG tablet Take 200 mg by mouth daily.    [provider]  loperamide (IMODIUM) 2 MG capsule Take by mouth as needed for diarrhea or loose stools.    [provider]  Menthol, Topical Analgesic, (BIOFREEZE EX) Apply topically as needed.    [provider]  Multiple Vitamins-Minerals (EMERGEN-C IMMUNE PO) Take by mouth.    [provider]  Multiple Vitamins-Minerals (MULTIVITAMIN WITH MINERALS) tablet Take 1 tablet by mouth 2 (two) times daily.     [provider]  Nerve Stimulator (PRO COMFORT TENS UNIT) DEVI by Does not apply route as needed.     [provider]  omeprazole (PRILOSEC) 20 MG capsule Take 20 mg by mouth every morning. 01/09/21   [provider]  QUEtiapine (SEROQUEL) 300 MG  tablet Take 300 mg by mouth at bedtime.    [provider]  Simethicone (GAS-X PO) Take by mouth as needed.     [provider]  sodium fluoride (PREVIDENT 5000 PLUS) 1.1 % CREA dental cream Denta 5000 Plus 1.1 % cream  USE TWICE A DAY, DO NOT EAT, DRINK, OR RINSE FOR 30 MINUTES AFTER USE Patient not taking: Reported on 08/02/2021    [provider]  topiramate (TOPAMAX) 25 MG tablet TAKE 2 TABLETS BY MOUTH AT BEDTIME 11/15/21   [name], PA-C  triamcinolone ointment (KENALOG) 0.1 % triamcinolone acetonide 0.1 % topical  ointment  APPLY THIN COAT TO AFFECTED AREA TWICE A DAY    [provider]      Allergies    Benztropine, Cefdinir, Clarithromycin, Erythromycin, Hydrocodone, Nsaids, and Pregabalin    Review of Systems   Review of Systems  Physical Exam Updated Vital Signs BP 103/64   Pulse 88   Temp 97.7 F (36.5 C) (Oral)   Resp 16   Ht '5\' 4"'$  (1.626 m)   Wt 52.2 kg   SpO2 100%   BMI 19.74 kg/m  Physical Exam Vitals and nursing note reviewed.  Constitutional:      Appearance: She is well-developed.  HENT:     Head: Normocephalic and atraumatic.     Mouth/Throat:     Mouth: Mucous membranes are dry.  Eyes:     Pupils: Pupils are equal, round, and reactive to light.     Comments: sunken  Cardiovascular:     Rate and Rhythm: Normal rate and regular rhythm.  Pulmonary:     Effort: No respiratory distress.     Breath sounds: No stridor.  Abdominal:     General: Abdomen is flat. There is no distension.  Musculoskeletal:     Cervical back: Normal range of motion.  Skin:    General: Skin is warm and dry.  Neurological:     General: No focal deficit present.     Mental Status: She is alert.     ED Results / Procedures / Treatments   Labs (all labs ordered are listed, but only abnormal results are displayed) Labs Reviewed  COMPREHENSIVE METABOLIC PANEL - Abnormal; Notable for the following components:      Result Value   Chloride 116 (*)    CO2 19 (*)    Glucose, Bld 135 (*)    Creatinine, Ser 1.17 (*)    Calcium 8.1 (*)    Total Protein 6.0 (*)    Albumin 3.3 (*)    GFR, Estimated 57 (*)    All other components within normal limits  LIPASE, BLOOD  CBC WITH DIFFERENTIAL/PLATELET  URINALYSIS, ROUTINE W REFLEX MICROSCOPIC    EKG None  Radiology No results found.  Procedures Procedures    Medications Ordered in ED Medications  lactated ringers bolus 1,500 mL (1,500 mLs Intravenous New Bag/Given 12/22/21 0600)    ED Course/ Medical Decision Making/ A&P                            Medical Decision Making Amount and/or Complexity of Data Reviewed Labs: ordered.  Fluids and symptomatic treatment. Gastroenteritis vs food poisoning vs GERD vs GI bug.  Bicarb slightly low c/w likely dehydration.  ***  {Document critical care time when appropriate:1} {Document review of labs and clinical decision tools ie heart score, Chads2Vasc2 etc:1}  {Document your independent review of radiology images, and  any outside records:1} {Document your discussion with family members, caretakers, and with consultants:1} {Document social determinants of health affecting pt's care:1} {Document your decision making why or why not admission, treatments were needed:1} Final Clinical Impression(s) / ED Diagnoses Final diagnoses:  None    Rx / DC Orders ED Discharge Orders     None

## 2021-12-22 NOTE — ED Provider Notes (Signed)
  Physical Exam  BP (!) 105/55   Pulse 92   Temp 98.8 F (37.1 C) (Oral)   Resp 14   Ht '5\' 4"'$  (1.626 m)   Wt 52.2 kg   SpO2 97%   BMI 19.74 kg/m   Physical Exam  Procedures  Procedures  ED Course / MDM   Clinical Course as of 12/22/21 0908  Fri Aug 25, 29110  5220 51 year old with fibromyalgia who presented with vomiting and diarrhea and a benign abdominal exam they repeated.  She was slightly dehydrated creatinine 1.17.  Received 2.5 L IV fluids and I reassessed her and she is now tolerating p.o. without nausea or vomiting.  UA suggestive of UTI with urinary symptoms moderate leukocyte esterase 11-20 WBCs and rare bacteria.  Will discharge with prescription for Keflex and as needed Zofran.  Discussed tricked return precautions.  She is in agreement with this plan and safe for discharge. [VB]    Clinical Course User Index [VB] Elgie Congo, MD   Medical Decision Making Amount and/or Complexity of Data Reviewed Labs: ordered.          Elgie Congo, MD 12/22/21 [id number]

## 2022-01-18 NOTE — Progress Notes (Deleted)
Office Visit Note  Patient: Felicia Rivera             Date of Birth: [birth year]           MRN: [id number]             PCP: Donald Prose, MD Referring: Donald Prose, MD Visit Date: 02/01/2022 Occupation: '@GUAROCC'$ @  Subjective:  No chief complaint on file.   History of Present Illness: Felicia Rivera is a 51 y.o. female ***   Activities of Daily Living:  Patient reports morning stiffness for *** {minute/hour:19697}.   Patient {ACTIONS;DENIES/REPORTS:21021675::"Denies"} nocturnal pain.  Difficulty dressing/grooming: {ACTIONS;DENIES/REPORTS:21021675::"Denies"} Difficulty climbing stairs: {ACTIONS;DENIES/REPORTS:21021675::"Denies"} Difficulty getting out of chair: {ACTIONS;DENIES/REPORTS:21021675::"Denies"} Difficulty using hands for taps, buttons, cutlery, and/or writing: {ACTIONS;DENIES/REPORTS:21021675::"Denies"}  No Rheumatology ROS completed.   PMFS History:  Patient Active Problem List   Diagnosis Date Noted  . Other fatigue 07/25/2016  . Other insomnia 07/25/2016  . Bilateral sacroiliitis ([place]) 07/25/2016  . Trochanteric bursitis of both hips 07/25/2016  . Fibromyalgia 02/28/2016  . Ulnar neuropathy at elbow, left 02/28/2016  . Bipolar disorder ([place]) 02/28/2016  . Vitamin D deficiency 02/28/2016  . Migraine 02/28/2016  . RLS (restless legs syndrome) 02/28/2016  . IBS (irritable bowel syndrome) 02/28/2016  . GERD (gastroesophageal reflux disease) 02/28/2016    Past Medical History:  Diagnosis Date  . Arthritis   . Bipolar 1 disorder ([place]) 02/28/2016  . Bipolar disorder ([place]) 02/28/2016  . Bursitis   . Fibromyalgia 02/28/2016  . GERD (gastroesophageal reflux disease) 02/28/2016  . IBS (irritable bowel syndrome) 02/28/2016  . Migraine 02/28/2016  . Periodic limb movement disorder ([facility])   . RLS (restless legs syndrome) 02/28/2016  . Ulnar neuropathy at elbow, left 02/28/2016  . Vitamin D deficiency 02/28/2016    Family History  Problem Relation Age of Onset   . Kidney failure Mother        Stage 3  . Diabetes Mother   . Glaucoma Mother   . Heart Problems Mother        pacemaker, defibrilator   . Hypertension Mother   . Congestive Heart Failure Mother   . Rheum arthritis Mother        possibly  . COPD Mother   . Emphysema Mother   . Hypertension Father   . High Cholesterol Father   . Heart Problems Father   . Leukemia Father   . Acute myelogenous leukemia Father    Past Surgical History:  Procedure Laterality Date  . ABDOMINAL HYSTERECTOMY    . dental work  04/16/2017   x2   Social History   Social History Narrative   Left handed   Immunization History  Administered Date(s) Administered  . PFIZER(Purple Top)SARS-COV-2 Vaccination 07/18/2019, 08/08/2019, 02/09/2020     Objective: Vital Signs: There were no vitals taken for this visit.   Physical Exam   Musculoskeletal Exam: ***  CDAI Exam: CDAI Score: -- Patient Global: --; Provider Global: -- Swollen: --; Tender: -- Joint Exam 02/01/2022   No joint exam has been documented for this visit   There is currently no information documented on the homunculus. Go to the Rheumatology activity and complete the homunculus joint exam.  Investigation: No additional findings.  Imaging: No results found.  Recent Labs: Lab Results  Component Value Date   WBC 8.7 12/22/2021   HGB 12.5 12/22/2021   PLT 177 12/22/2021   NA 141 12/22/2021   K 3.7 12/22/2021   CL 116 (H) 12/22/2021   CO2 19 (  L) 12/22/2021   GLUCOSE 135 (H) 12/22/2021   BUN 15 12/22/2021   CREATININE 1.17 (H) 12/22/2021   BILITOT 0.8 12/22/2021   ALKPHOS 44 12/22/2021   AST 24 12/22/2021   ALT 8 12/22/2021   PROT 6.0 (L) 12/22/2021   ALBUMIN 3.3 (L) 12/22/2021   CALCIUM 8.1 (L) 12/22/2021   GFRAA 51 (L) 07/31/2016    Speciality Comments: No specialty comments available.  Procedures:  No procedures performed Allergies: Benztropine, Cefdinir, Clarithromycin, Erythromycin, Hydrocodone, Nsaids,  and Pregabalin   Assessment / Plan:     Visit Diagnoses: No diagnosis found.  Orders: No orders of the defined types were placed in this encounter.  No orders of the defined types were placed in this encounter.   Face-to-face time spent with patient was *** minutes. Greater than 50% of time was spent in counseling and coordination of care.  Follow-Up Instructions: No follow-ups on file.   Earnestine Mealing, CMA  Note - This record has been created using Editor, commissioning.  Chart creation errors have been sought, but may not always  have been located. Such creation errors do not reflect on  the standard of medical care.

## 2022-01-30 DIAGNOSIS — R059 Cough, unspecified: Secondary | ICD-10-CM | POA: Diagnosis not present

## 2022-01-30 DIAGNOSIS — U071 COVID-19: Secondary | ICD-10-CM | POA: Diagnosis not present

## 2022-01-30 DIAGNOSIS — M791 Myalgia, unspecified site: Secondary | ICD-10-CM | POA: Diagnosis not present

## 2022-01-30 DIAGNOSIS — R509 Fever, unspecified: Secondary | ICD-10-CM | POA: Diagnosis not present

## 2022-01-30 DIAGNOSIS — R519 Headache, unspecified: Secondary | ICD-10-CM | POA: Diagnosis not present

## 2022-01-30 DIAGNOSIS — J029 Acute pharyngitis, unspecified: Secondary | ICD-10-CM | POA: Diagnosis not present

## 2022-02-01 ENCOUNTER — Ambulatory Visit: Payer: Medicare Other | Admitting: Physician Assistant

## 2022-02-01 DIAGNOSIS — R5383 Other fatigue: Secondary | ICD-10-CM

## 2022-02-01 DIAGNOSIS — M19041 Primary osteoarthritis, right hand: Secondary | ICD-10-CM

## 2022-02-01 DIAGNOSIS — M797 Fibromyalgia: Secondary | ICD-10-CM

## 2022-02-01 DIAGNOSIS — M7061 Trochanteric bursitis, right hip: Secondary | ICD-10-CM

## 2022-02-01 DIAGNOSIS — Z8639 Personal history of other endocrine, nutritional and metabolic disease: Secondary | ICD-10-CM

## 2022-02-01 DIAGNOSIS — Z8659 Personal history of other mental and behavioral disorders: Secondary | ICD-10-CM

## 2022-02-01 DIAGNOSIS — G2581 Restless legs syndrome: Secondary | ICD-10-CM

## 2022-02-01 DIAGNOSIS — G4709 Other insomnia: Secondary | ICD-10-CM

## 2022-02-01 DIAGNOSIS — Q667 Congenital pes cavus, unspecified foot: Secondary | ICD-10-CM

## 2022-02-01 DIAGNOSIS — Z8719 Personal history of other diseases of the digestive system: Secondary | ICD-10-CM

## 2022-02-01 DIAGNOSIS — Z8669 Personal history of other diseases of the nervous system and sense organs: Secondary | ICD-10-CM

## 2022-02-01 DIAGNOSIS — M79671 Pain in right foot: Secondary | ICD-10-CM

## 2022-02-01 DIAGNOSIS — M62838 Other muscle spasm: Secondary | ICD-10-CM

## 2022-02-06 DIAGNOSIS — F3132 Bipolar disorder, current episode depressed, moderate: Secondary | ICD-10-CM | POA: Diagnosis not present

## 2022-02-06 DIAGNOSIS — F411 Generalized anxiety disorder: Secondary | ICD-10-CM | POA: Diagnosis not present

## 2022-03-07 DIAGNOSIS — F3132 Bipolar disorder, current episode depressed, moderate: Secondary | ICD-10-CM | POA: Diagnosis not present

## 2022-03-07 DIAGNOSIS — F411 Generalized anxiety disorder: Secondary | ICD-10-CM | POA: Diagnosis not present

## 2022-03-08 ENCOUNTER — Ambulatory Visit: Payer: Medicare Other | Admitting: Physician Assistant

## 2022-03-13 ENCOUNTER — Other Ambulatory Visit: Payer: Self-pay | Admitting: *Deleted

## 2022-03-13 MED ORDER — TOPIRAMATE 25 MG PO TABS: 50.0000 mg | ORAL_TABLET | Freq: Every day | ORAL | 0 refills | Status: DC

## 2022-03-13 NOTE — Telephone Encounter (Signed)
Next Visit: 03/28/2022  Last Visit: 08/02/2021  Last Fill: 11/15/2021  Dx: History of migraine   Current Dose per office note on 08/02/2021: Topamax 25 mg 2 tablets by mouth at bedtime for migraines.   Okay to refill Topamax?

## 2022-03-14 NOTE — Progress Notes (Signed)
Office Visit Note  Patient: Felicia Rivera             Date of Birth: 21-Jul-1970           MRN: [id number]             PCP: Donald Prose, MD Referring: Donald Prose, MD Visit Date: 03/28/2022 Occupation: '@GUAROCC'$ @  Subjective:  Medication management  History of Present Illness: Felicia Rivera is a 51 y.o. female with history of osteoarthritis and fibromyalgia syndrome.  She states she has been under a lot of stress as she lost her father in June 2023.  She is consolidating 3 homes currently.  She is moved into her new property.  She is renovating her home and also organizing her home.  Which is hard on her joints.  She is having a flare of fibromyalgia with generalized pain and discomfort.  She takes baclofen 10 mg p.o. nightly as needed.  She states baclofen makes her groggy the next day.  She has been taking Topamax 50 mg at bedtime.  She states she recently had crowns placed on her teeth and could not eat.  She lost weight in the process of dental work.  She has difficulty walking long distance due to discomfort all over.  She requested a handicap placard.  Activities of Daily Living:  Patient reports morning stiffness for 30 minutes.   Patient Reports nocturnal pain.  Difficulty dressing/grooming: Denies Difficulty climbing stairs: Reports Difficulty getting out of chair: Denies Difficulty using hands for taps, buttons, cutlery, and/or writing: Reports  Review of Systems  Constitutional:  Positive for fatigue.  HENT:  Positive for mouth dryness. Negative for mouth sores.   Eyes:  Positive for dryness.  Respiratory:  Negative for difficulty breathing.   Cardiovascular:  Negative for chest pain and palpitations.  Gastrointestinal:  Positive for constipation and diarrhea. Negative for blood in stool.  Endocrine: Negative for increased urination.  Genitourinary:  Negative for involuntary urination.  Musculoskeletal:  Positive for joint pain, gait problem, joint pain, myalgias, muscle  weakness, morning stiffness, muscle tenderness and myalgias. Negative for joint swelling.  Skin:  Negative for color change, rash, hair loss and sensitivity to sunlight.  Allergic/Immunologic: Negative for susceptible to infections.  Neurological:  Positive for dizziness and headaches.  Hematological:  Negative for swollen glands.  Psychiatric/Behavioral:  Positive for depressed mood and sleep disturbance. The patient is nervous/anxious.     PMFS History:  Patient Active Problem List   Diagnosis Date Noted   Other fatigue 07/25/2016   Other insomnia 07/25/2016   Bilateral sacroiliitis ([place]) 07/25/2016   Trochanteric bursitis of both hips 07/25/2016   Fibromyalgia 02/28/2016   Ulnar neuropathy at elbow, left 02/28/2016   Bipolar disorder ([place]) 02/28/2016   Vitamin D deficiency 02/28/2016   Migraine 02/28/2016   RLS (restless legs syndrome) 02/28/2016   IBS (irritable bowel syndrome) 02/28/2016   GERD (gastroesophageal reflux disease) 02/28/2016    Past Medical History:  Diagnosis Date   Arthritis    Bipolar 1 disorder ([place]) 02/28/2016   Bipolar disorder ([place]) 02/28/2016   Bursitis    Fibromyalgia 02/28/2016   GERD (gastroesophageal reflux disease) 02/28/2016   IBS (irritable bowel syndrome) 02/28/2016   Migraine 02/28/2016   Periodic limb movement disorder (PLMD)    RLS (restless legs syndrome) 02/28/2016   Ulnar neuropathy at elbow, left 02/28/2016   Vitamin D deficiency 02/28/2016    Family History  Problem Relation Age of Onset   Kidney failure Mother  Stage 3   Diabetes Mother    Glaucoma Mother    Heart Problems Mother        pacemaker, defibrilator    Hypertension Mother    Congestive Heart Failure Mother    Rheum arthritis Mother        possibly   COPD Mother    Emphysema Mother    Hypertension Father    High Cholesterol Father    Heart Problems Father    Leukemia Father    Acute myelogenous leukemia Father    Past Surgical History:  Procedure  Laterality Date   ABDOMINAL HYSTERECTOMY     dental work  04/16/2017   x2   Social History   Social History Narrative   Left handed   Immunization History  Administered Date(s) Administered   PFIZER(Purple Top)SARS-COV-2 Vaccination 07/18/2019, 08/08/2019, 02/09/2020     Objective: Vital Signs: BP 106/75 (BP Location: Left Arm, Patient Position: Sitting, Cuff Size: Small)   Pulse 90   Resp 12   Ht '5\' 4"'$  (1.626 m)   Wt 99 lb 12.8 oz (45.3 kg)   BMI 17.13 kg/m    Physical Exam Vitals and nursing note reviewed.  Constitutional:      Appearance: She is well-developed.  HENT:     Head: Normocephalic and atraumatic.  Eyes:     Conjunctiva/sclera: Conjunctivae normal.  Cardiovascular:     Rate and Rhythm: Normal rate and regular rhythm.     Heart sounds: Normal heart sounds.  Pulmonary:     Effort: Pulmonary effort is normal.     Breath sounds: Normal breath sounds.  Abdominal:     General: Bowel sounds are normal.     Palpations: Abdomen is soft.  Musculoskeletal:     Cervical back: Normal range of motion.  Lymphadenopathy:     Cervical: No cervical adenopathy.  Skin:    General: Skin is warm and dry.     Capillary Refill: Capillary refill takes less than 2 seconds.  Neurological:     Mental Status: She is alert and oriented to person, place, and time.  Psychiatric:        Behavior: Behavior normal.      Musculoskeletal Exam: Cervical spine was in good range of motion.  Shoulder joints, elbow joints, wrist joints, MCPs PIPs and DIPs with good range of motion with no synovitis.  Hip joints with good range of motion.  She had tenderness over trochanteric bursa.  Knee joints in good range of motion without any warmth swelling or effusion.  There was no tenderness over ankles or MTPs.  She had generalized hyperalgesia and positive tender points.  CDAI Exam: CDAI Score: -- Patient Global: --; Provider Global: -- Swollen: --; Tender: -- Joint Exam 03/28/2022   No  joint exam has been documented for this visit   There is currently no information documented on the homunculus. Go to the Rheumatology activity and complete the homunculus joint exam.  Investigation: No additional findings.  Imaging: No results found.  Recent Labs: Lab Results  Component Value Date   WBC 8.7 12/22/2021   HGB 12.5 12/22/2021   PLT 177 12/22/2021   NA 141 12/22/2021   K 3.7 12/22/2021   CL 116 (H) 12/22/2021   CO2 19 (L) 12/22/2021   GLUCOSE 135 (H) 12/22/2021   BUN 15 12/22/2021   CREATININE 1.17 (H) 12/22/2021   BILITOT 0.8 12/22/2021   ALKPHOS 44 12/22/2021   AST 24 12/22/2021   ALT 8 12/22/2021  PROT 6.0 (L) 12/22/2021   ALBUMIN 3.3 (L) 12/22/2021   CALCIUM 8.1 (L) 12/22/2021   GFRAA 51 (L) 07/31/2016    Speciality Comments: No specialty comments available.  Procedures:  No procedures performed Allergies: Benztropine, Cefdinir, Clarithromycin, Erythromycin, Hydrocodone, Nsaids, and Pregabalin   Assessment / Plan:     Visit Diagnoses: Primary osteoarthritis of both hands-she complains of pain and discomfort in her bilateral hands.  She had mild PIP and DIP thickening.  Joint protection muscle strengthening was discussed.  Trochanteric bursitis of both hips-she continues to have pain and discomfort in her trochanteric region.  She states the symptoms have been worse since she has been moving into the new house and carrying boxes up and down stairs.  She has difficulty walking.  She requested a handicap placard.  The form was filled and given to the patient.  A handout 90 minutes stretches was given.  Pain in right foot-she gives history of intermittent pain in her right foot.  No warmth swelling or synovitis was noted.  Pes cavus - Bilateral.  Proper fitting shoes with arch support were advised.  Fibromyalgia -she continues to have generalized pain and discomfort from fibromyalgia.  Patient states her fibromyalgia is flaring due to stress.  She lost  her mother in October 2022 and her father in June 2023.  She takes baclofen 10 mg 1 tablet daily only occasionally.  She also takes Topamax 25 mg tablet, 2 tablets p.o. nightly.  She believes that helps her with generalized pain and also migraines.  I discussed decreasing the dose of Topamax to 1 tablet at bedtime if tolerated.  For regular exercise, water aerobics and stretching was discussed.  Trapezius muscle spasm-she continues to have tender with some spasm in the trapezius region.  Stretching exercises were demonstrated.  Other insomnia-good sleep hygiene was discussed.  Other fatigue-related to insomnia and fibromyalgia.  History of migraine - She was evaluated by Dr. Tomi Likens.She takes Topamax 25 mg 2 tablets by mouth at bedtime for migraines.  Other medical problems are listed as follows:  RLS (restless legs syndrome)  History of vitamin D deficiency-use of vitamin D supplement was discussed.  History of gastroesophageal reflux (GERD)  History of bipolar disorder  History of IBS  Felicia Rivera is going through the grieving process as she lost her both parents in the last year.  Weight loss-patient states she has lost a lot of weight due to having dental issues and also stress.  Dietary modifications and exercise was emphasized.  Orders: No orders of the defined types were placed in this encounter.  No orders of the defined types were placed in this encounter.     Follow-Up Instructions: Return in about 6 months (around 09/26/2022) for Osteoarthritis, FMS.   Bo Merino, MD  Note - This record has been created using Editor, commissioning.  Chart creation errors have been sought, but may not always  have been located. Such creation errors do not reflect on  the standard of medical care.

## 2022-03-28 ENCOUNTER — Ambulatory Visit: Payer: Medicare Other | Attending: Physician Assistant | Admitting: Rheumatology

## 2022-03-28 ENCOUNTER — Encounter: Payer: Self-pay | Admitting: Rheumatology

## 2022-03-28 VITALS — BP 106/75 | HR 90 | Resp 12 | Ht 64.0 in | Wt 99.8 lb

## 2022-03-28 DIAGNOSIS — M7061 Trochanteric bursitis, right hip: Secondary | ICD-10-CM | POA: Diagnosis not present

## 2022-03-28 DIAGNOSIS — Z8669 Personal history of other diseases of the nervous system and sense organs: Secondary | ICD-10-CM | POA: Insufficient documentation

## 2022-03-28 DIAGNOSIS — Q667 Congenital pes cavus, unspecified foot: Secondary | ICD-10-CM | POA: Diagnosis not present

## 2022-03-28 DIAGNOSIS — M19041 Primary osteoarthritis, right hand: Secondary | ICD-10-CM | POA: Insufficient documentation

## 2022-03-28 DIAGNOSIS — M62838 Other muscle spasm: Secondary | ICD-10-CM | POA: Insufficient documentation

## 2022-03-28 DIAGNOSIS — Z8719 Personal history of other diseases of the digestive system: Secondary | ICD-10-CM | POA: Diagnosis not present

## 2022-03-28 DIAGNOSIS — G4709 Other insomnia: Secondary | ICD-10-CM | POA: Diagnosis not present

## 2022-03-28 DIAGNOSIS — R634 Abnormal weight loss: Secondary | ICD-10-CM | POA: Insufficient documentation

## 2022-03-28 DIAGNOSIS — F4321 Adjustment disorder with depressed mood: Secondary | ICD-10-CM | POA: Diagnosis not present

## 2022-03-28 DIAGNOSIS — M797 Fibromyalgia: Secondary | ICD-10-CM | POA: Insufficient documentation

## 2022-03-28 DIAGNOSIS — M7062 Trochanteric bursitis, left hip: Secondary | ICD-10-CM | POA: Insufficient documentation

## 2022-03-28 DIAGNOSIS — R5383 Other fatigue: Secondary | ICD-10-CM | POA: Insufficient documentation

## 2022-03-28 DIAGNOSIS — Z8639 Personal history of other endocrine, nutritional and metabolic disease: Secondary | ICD-10-CM | POA: Diagnosis not present

## 2022-03-28 DIAGNOSIS — M19042 Primary osteoarthritis, left hand: Secondary | ICD-10-CM | POA: Diagnosis not present

## 2022-03-28 DIAGNOSIS — Z8659 Personal history of other mental and behavioral disorders: Secondary | ICD-10-CM | POA: Insufficient documentation

## 2022-03-28 DIAGNOSIS — G2581 Restless legs syndrome: Secondary | ICD-10-CM | POA: Insufficient documentation

## 2022-03-28 DIAGNOSIS — M79671 Pain in right foot: Secondary | ICD-10-CM | POA: Diagnosis not present

## 2022-03-28 NOTE — Patient Instructions (Signed)
Iliotibial Band Syndrome Rehab Ask your health care provider which exercises are safe for you. Do exercises exactly as told by your health care provider and adjust them as directed. It is normal to feel mild stretching, pulling, tightness, or discomfort as you do these exercises. Stop right away if you feel sudden pain or your pain gets significantly worse. Do not begin these exercises until told by your health care provider. Stretching and range-of-motion exercises These exercises warm up your muscles and joints and improve the movement and flexibility of your hip and pelvis. Quadriceps stretch, prone  Lie on your abdomen (prone position) on a firm surface, such as a bed or padded floor. Bend your left / right knee and reach back to hold your ankle or pant leg. If you cannot reach your ankle or pant leg, loop a belt around your foot and grab the belt instead. Gently pull your heel toward your buttocks. Your knee should not slide out to the side. You should feel a stretch in the front of your thigh and knee (quadriceps). Hold this position for __________ seconds. Repeat __________ times. Complete this exercise __________ times a day. Iliotibial band stretch An iliotibial band is a strong band of muscle tissue that runs from the outer side of your hip to the outer side of your thigh and knee. Lie on your side with your left / right leg in the top position. Bend both of your knees and grab your left / right ankle. Stretch out your bottom arm to help you balance. Slowly bring your top knee back so your thigh goes behind your trunk. Slowly lower your top leg toward the floor until you feel a gentle stretch on the outside of your left / right hip and thigh. If you do not feel a stretch and your knee will not fall farther, place the heel of your other foot on top of your knee and pull your knee down toward the floor with your foot. Hold this position for __________ seconds. Repeat __________ times.  Complete this exercise __________ times a day. Strengthening exercises These exercises build strength and endurance in your hip and pelvis. Endurance is the ability to use your muscles for a long time, even after they get tired. Straight leg raises, side-lying This exercise strengthens the muscles that rotate the leg at the hip and move it away from your body (hip abductors). Lie on your side with your left / right leg in the top position. Lie so your head, shoulder, hip, and knee line up. You may bend your bottom knee to help you balance. Roll your hips slightly forward so your hips are stacked directly over each other and your left / right knee is facing forward. Tense the muscles in your outer thigh and lift your top leg 4-6 inches (10-15 cm). Hold this position for __________ seconds. Slowly lower your leg to return to the starting position. Let your muscles relax completely before doing another repetition. Repeat __________ times. Complete this exercise __________ times a day. Leg raises, prone This exercise strengthens the muscles that move the hips backward (hip extensors). Lie on your abdomen (prone position) on your bed or a firm surface. You can put a pillow under your hips if that is more comfortable for your lower back. Bend your left / right knee so your foot is straight up in the air. Squeeze your buttocks muscles and lift your left / right thigh off the bed. Do not let your back arch. Tense   your thigh muscle as hard as you can without increasing any knee pain. Hold this position for __________ seconds. Slowly lower your leg to return to the starting position and allow it to relax completely. Repeat __________ times. Complete this exercise __________ times a day. Hip hike Stand sideways on a bottom step. Stand on your left / right leg with your other foot unsupported next to the step. You can hold on to a railing or wall for balance if needed. Keep your knees straight and your  torso square. Then lift your left / right hip up toward the ceiling. Slowly let your left / right hip lower toward the floor, past the starting position. Your foot should get closer to the floor. Do not lean or bend your knees. Repeat __________ times. Complete this exercise __________ times a day. This information is not intended to replace advice given to you by your health care provider. Make sure you discuss any questions you have with your health care provider. Document Revised: 06/24/2019 Document Reviewed: 06/24/2019 Elsevier Patient Education  2023 [employer]  

## 2022-05-10 DIAGNOSIS — F411 Generalized anxiety disorder: Secondary | ICD-10-CM | POA: Diagnosis not present

## 2022-05-10 DIAGNOSIS — F3132 Bipolar disorder, current episode depressed, moderate: Secondary | ICD-10-CM | POA: Diagnosis not present

## 2022-05-16 DIAGNOSIS — Z23 Encounter for immunization: Secondary | ICD-10-CM | POA: Diagnosis not present

## 2022-05-24 DIAGNOSIS — J4540 Moderate persistent asthma, uncomplicated: Secondary | ICD-10-CM | POA: Diagnosis not present

## 2022-05-24 DIAGNOSIS — J310 Chronic rhinitis: Secondary | ICD-10-CM | POA: Diagnosis not present

## 2022-05-24 DIAGNOSIS — H1045 Other chronic allergic conjunctivitis: Secondary | ICD-10-CM | POA: Diagnosis not present

## 2022-05-24 DIAGNOSIS — J300 Vasomotor rhinitis: Secondary | ICD-10-CM | POA: Diagnosis not present

## 2022-06-04 ENCOUNTER — Other Ambulatory Visit: Payer: Self-pay | Admitting: Physician Assistant

## 2022-06-04 NOTE — Telephone Encounter (Signed)
Next Visit: 09/27/2022  Last Visit: 03/28/2022  Last Fill: 03/13/2022  DX: Fibromyalgia   Current Dose per office note on 03/28/2022: Topamax 25 mg tablet, 2 tablets p.o. nightly. She believes that helps her with generalized pain and also migraines. I discussed decreasing the dose of Topamax to 1 tablet at bedtime if tolerated.   Called patient to clarify dosage and patient states she has decreased to 1 tab nightly and does not need a refill at this time.

## 2022-06-18 ENCOUNTER — Other Ambulatory Visit: Payer: Self-pay

## 2022-06-18 ENCOUNTER — Encounter (HOSPITAL_COMMUNITY): Payer: Self-pay

## 2022-06-18 ENCOUNTER — Emergency Department (HOSPITAL_COMMUNITY): Payer: Medicare Other

## 2022-06-18 ENCOUNTER — Emergency Department (HOSPITAL_COMMUNITY)
Admission: EM | Admit: 2022-06-18 | Discharge: 2022-06-18 | Disposition: A | Discharge status: Home / Self Care | Payer: Medicare Other | Attending: Emergency Medicine | Admitting: Emergency Medicine

## 2022-06-18 DIAGNOSIS — R1111 Vomiting without nausea: Secondary | ICD-10-CM | POA: Diagnosis not present

## 2022-06-18 DIAGNOSIS — R1013 Epigastric pain: Secondary | ICD-10-CM | POA: Diagnosis not present

## 2022-06-18 DIAGNOSIS — R110 Nausea: Secondary | ICD-10-CM

## 2022-06-18 DIAGNOSIS — R112 Nausea with vomiting, unspecified: Secondary | ICD-10-CM | POA: Diagnosis not present

## 2022-06-18 DIAGNOSIS — R059 Cough, unspecified: Secondary | ICD-10-CM | POA: Diagnosis not present

## 2022-06-18 DIAGNOSIS — I959 Hypotension, unspecified: Secondary | ICD-10-CM | POA: Diagnosis not present

## 2022-06-18 DIAGNOSIS — J029 Acute pharyngitis, unspecified: Secondary | ICD-10-CM | POA: Diagnosis not present

## 2022-06-18 DIAGNOSIS — R0789 Other chest pain: Secondary | ICD-10-CM | POA: Diagnosis not present

## 2022-06-18 LAB — COMPREHENSIVE METABOLIC PANEL
ALT: 9 U/L (ref 0–44)
AST: 18 U/L (ref 15–41)
Albumin: 3.8 g/dL (ref 3.5–5.0)
Alkaline Phosphatase: 46 U/L (ref 38–126)
Anion gap: 8 (ref 5–15)
BUN: 16 mg/dL (ref 6–20)
CO2: 24 mmol/L (ref 22–32)
Calcium: 8.7 mg/dL — ABNORMAL LOW (ref 8.9–10.3)
Chloride: 109 mmol/L (ref 98–111)
Creatinine, Ser: 0.99 mg/dL (ref 0.44–1.00)
GFR, Estimated: >60 mL/min (ref >60)
Glucose, Bld: 116 mg/dL — ABNORMAL HIGH (ref 70–99)
Potassium: 3.7 mmol/L (ref 3.5–5.1)
Sodium: 141 mmol/L (ref 135–145)
Total Bilirubin: 0.5 mg/dL (ref 0.3–1.2)
Total Protein: 6.7 g/dL (ref 6.5–8.1)

## 2022-06-18 LAB — LIPASE, BLOOD: Lipase: 31 U/L (ref 11–51)

## 2022-06-18 LAB — MAGNESIUM: Magnesium: 2.1 mg/dL (ref 1.7–2.4)

## 2022-06-18 LAB — CBC WITH DIFFERENTIAL/PLATELET
Abs Immature Granulocytes: 0.02 10*3/uL (ref 0.00–0.07)
Basophils Absolute: 0.1 10*3/uL (ref 0.0–0.1)
Basophils Relative: 1 %
Eosinophils Absolute: 0.1 10*3/uL (ref 0.0–0.5)
Eosinophils Relative: 1 %
HCT: 40 % (ref 36.0–46.0)
Hemoglobin: 13.1 g/dL (ref 12.0–15.0)
Immature Granulocytes: 0 %
Lymphocytes Relative: 8 %
Lymphs Abs: 0.7 10*3/uL (ref 0.7–4.0)
MCH: 31.9 pg (ref 26.0–34.0)
MCHC: 32.8 g/dL (ref 30.0–36.0)
MCV: 97.3 fL (ref 80.0–100.0)
Monocytes Absolute: 0.3 10*3/uL (ref 0.1–1.0)
Monocytes Relative: 3 %
Neutro Abs: 7.4 10*3/uL (ref 1.7–7.7)
Neutrophils Relative %: 87 %
Platelets: 191 10*3/uL (ref 150–400)
RBC: 4.11 MIL/uL (ref 3.87–5.11)
RDW: 12.1 % (ref 11.5–15.5)
WBC: 8.5 10*3/uL (ref 4.0–10.5)
nRBC: 0 % (ref 0.0–0.2)

## 2022-06-18 LAB — TROPONIN I (HIGH SENSITIVITY): Troponin I (High Sensitivity): <2 ng/L (ref <18)

## 2022-06-18 MED ORDER — ONDANSETRON HCL 4 MG/2ML IJ SOLN: 4.0000 mg | Freq: Once | INTRAMUSCULAR | Status: DC

## 2022-06-18 MED ORDER — LACTATED RINGERS IV BOLUS
1000.0000 mL | Freq: Once | INTRAVENOUS | Status: AC
  Administered 2022-06-18: 1000 mL via INTRAVENOUS

## 2022-06-18 MED ORDER — LIDOCAINE VISCOUS HCL 2 % MT SOLN
15.0000 mL | Freq: Once | OROMUCOSAL | Status: AC
  Administered 2022-06-18: 15 mL via ORAL
  Filled 2022-06-18: qty 15

## 2022-06-18 MED ORDER — ALUM & MAG HYDROXIDE-SIMETH 200-200-20 MG/5ML PO SUSP
30.0000 mL | Freq: Once | ORAL | Status: AC
  Administered 2022-06-18: 30 mL via ORAL
  Filled 2022-06-18: qty 30

## 2022-06-18 NOTE — ED Triage Notes (Signed)
Patient coming from dental office via EMS with c/o n/v today. Patient states that towards the end of her cleaning that she started feeling sick, vomitted, and then noticed her chest got tight in the center and had some left jaw pain. Pt states that chest pain has dissipated but the jaw pain remains. Patient was on nitrous oxide while receiving her cleaning.  1m zofran IV with EMS.

## 2022-06-18 NOTE — ED Notes (Signed)
Dispo instructions reviewed with pt, questions addressed and verbalized understanding. Pt ambulatory to lobby

## 2022-06-18 NOTE — Discharge Instructions (Signed)
Return to the emergency department for any new or worsening symptoms of concern.

## 2022-06-18 NOTE — ED Provider Notes (Signed)
[place] EMERGENCY DEPARTMENT AT [facility] Provider Note   CSN: [id number] Arrival date & time: 06/18/22  1635     History  Chief Complaint  Patient presents with   Nausea   Emesis    Felicia Rivera is a 52 y.o. female.  HPI Patient resents for nausea vomiting.  Medical history includes fibromyalgia, bipolar disorder, RLS, IBS, GERD, migraines, arthritis.  Patient reports that she was in her normal state of health earlier today.  She went to the dentist office for cleaning.  They gave her nitrous oxide gas during the cleaning.  Towards the end of cleaning, she developed nausea and vomiting.  She states that he vomited a small bit of stomach acid.  She denies any current nausea.  She does note a mild sore throat.  She states that she has been coughing since this episode.  She denies any other physical complaints.    Home Medications Prior to Admission medications   Medication Sig Start Date End Date Taking? Authorizing Provider  acetaminophen (TYLENOL) 500 MG tablet Take 500 mg by mouth every 6 (six) hours as needed.    [provider]  albuterol (PROVENTIL HFA;VENTOLIN HFA) 108 (90 Base) MCG/ACT inhaler Inhale into the lungs every 6 (six) hours as needed for wheezing or shortness of breath.    [provider]  baclofen (LIORESAL) 10 MG tablet TAKE 1 TABLET BY MOUTH AT BEDTIME AS NEEDED FOR MUSCLE SPASMS. 05/29/21   [name], PA-C  buPROPion (WELLBUTRIN XL) 300 MG 24 hr tablet Take 150 mg by mouth.  12/09/16   [provider]  Calcium Carbonate Antacid (TUMS PO) Take by mouth as needed.    [provider]  cetirizine (ZYRTEC) 10 MG tablet Take 10 mg by mouth daily.    [provider]  clonazePAM (KLONOPIN) 1 MG tablet 2 (two) times daily. 05/15/19   [provider]  DiphenhydrAMINE HCl (BENADRYL PO) Take by mouth as needed.    [provider]  fluticasone (FLONASE) 50 MCG/ACT nasal spray USE 2 SPRAYS IN  EACH NOSTRIL ONCE A DAY DURING SEASONS OF DIFFICULTY 90    [provider]  fluticasone-salmeterol (ADVAIR) 250-50 MCG/ACT AEPB 1 puff 10/17/20   [provider]  ipratropium (ATROVENT) 0.03 % nasal spray     [provider]  lamoTRIgine (LAMICTAL) 200 MG tablet Take 200 mg by mouth daily.    [provider]  loperamide (IMODIUM) 2 MG capsule Take by mouth as needed for diarrhea or loose stools.    [provider]  Menthol, Topical Analgesic, (BIOFREEZE EX) Apply topically as needed.    [provider]  Multiple Vitamins-Minerals (EMERGEN-C IMMUNE PO) Take by mouth.    [provider]  Nerve Stimulator (PRO COMFORT TENS UNIT) DEVI by Does not apply route as needed.     [provider]  omeprazole (PRILOSEC) 20 MG capsule Take 20 mg by mouth every morning. 01/09/21   [provider]  ondansetron (ZOFRAN-ODT) 4 MG disintegrating tablet Take 1 tablet (4 mg total) by mouth every 8 (eight) hours as needed for nausea or vomiting. 12/22/21   Elgie Congo, MD  QUEtiapine (SEROQUEL) 300 MG tablet Take 300 mg by mouth at bedtime.    [provider]  Simethicone (GAS-X PO) Take by mouth as needed.     [provider]  sodium fluoride (PREVIDENT 5000 PLUS) 1.1 % CREA dental cream Denta 5000 Plus 1.1 % cream  USE TWICE  A DAY, DO NOT EAT, DRINK, OR RINSE FOR 30 MINUTES AFTER USE Patient not taking: Reported on 08/02/2021    [provider]  topiramate (TOPAMAX) 25 MG tablet Take 2 tablets (50 mg total) by mouth at bedtime. 03/13/22   [name], PA-C  triamcinolone ointment (KENALOG) 0.1 % triamcinolone acetonide 0.1 % topical ointment  APPLY THIN COAT TO AFFECTED AREA TWICE A DAY Patient not taking: Reported on 03/28/2022    [provider]      Allergies    Benztropine, Cefdinir, Clarithromycin, Erythromycin, Hydrocodone, Nsaids, and Pregabalin    Review of Systems   Review of  Systems  HENT:  Positive for sore throat.   Respiratory:  Positive for cough.   Cardiovascular:  Positive for chest pain.  Gastrointestinal:  Positive for nausea and vomiting.  All other systems reviewed and are negative.   Physical Exam Updated Vital Signs BP (!) 101/53 (BP Location: Right Arm)   Pulse 69   Temp (!) 97.5 F (36.4 C) (Oral)   Resp 19   Ht 5' 4"$  (1.626 m)   Wt 44 kg   SpO2 95%   BMI 16.65 kg/m  Physical Exam Vitals and nursing note reviewed.  Constitutional:      General: She is not in acute distress.    Appearance: Normal appearance. She is well-developed. She is not ill-appearing, toxic-appearing or diaphoretic.  HENT:     Head: Normocephalic and atraumatic.     Right Ear: External ear normal.     Left Ear: External ear normal.     Nose: Nose normal.     Mouth/Throat:     Mouth: Mucous membranes are moist.     Pharynx: Oropharynx is clear. No oropharyngeal exudate or posterior oropharyngeal erythema.  Eyes:     Extraocular Movements: Extraocular movements intact.     Conjunctiva/sclera: Conjunctivae normal.  Cardiovascular:     Rate and Rhythm: Normal rate and regular rhythm.  Pulmonary:     Effort: Pulmonary effort is normal. No respiratory distress.  Abdominal:     General: There is no distension.     Palpations: Abdomen is soft.     Tenderness: There is no abdominal tenderness.  Musculoskeletal:        General: No swelling. Normal range of motion.     Cervical back: Normal range of motion and neck supple.     Right lower leg: No edema.     Left lower leg: No edema.  Skin:    General: Skin is warm and dry.     Capillary Refill: Capillary refill takes less than 2 seconds.     Coloration: Skin is not jaundiced or pale.  Neurological:     General: No focal deficit present.     Mental Status: She is alert and oriented to person, place, and time.     Cranial Nerves: No cranial nerve deficit.     Sensory: No sensory deficit.     Motor: No  weakness.     Coordination: Coordination normal.  Psychiatric:        Mood and Affect: Mood normal.        Behavior: Behavior normal.        Thought Content: Thought content normal.        Judgment: Judgment normal.     ED Results / Procedures / Treatments   Labs (all labs ordered are listed, but only abnormal results are displayed) Labs Reviewed  COMPREHENSIVE METABOLIC PANEL - Abnormal; Notable for  the following components:      Result Value   Glucose, Bld 116 (*)    Calcium 8.7 (*)    All other components within normal limits  LIPASE, BLOOD  CBC WITH DIFFERENTIAL/PLATELET  MAGNESIUM  URINALYSIS, ROUTINE W REFLEX MICROSCOPIC  TROPONIN I (HIGH SENSITIVITY)  TROPONIN I (HIGH SENSITIVITY)    EKG EKG Interpretation  Date/Time:  Monday June 18 2022 17:28:00 EST Ventricular Rate:  80 PR Interval:    QRS Duration: 96 QT Interval:  424 QTC Calculation: 490 R Axis:   93 Text Interpretation: Sinus rhythm Borderline right axis deviation Anteroseptal infarct, old Borderline T abnormalities, inferior leads Confirmed by Godfrey Pick [phone]) on 06/18/2022 5:37:21 PM  Radiology DG Chest Portable 1 View  Result Date: 06/18/2022 CLINICAL DATA:  Cough EXAM: PORTABLE CHEST 1 VIEW COMPARISON:  X-ray 03/21/2020 FINDINGS: The heart size and mediastinal contours are within normal limits. Clear hyperinflated lungs. No consolidation, pneumothorax or effusion. The visualized skeletal structures are unremarkable. Overlapping cardiac leads. IMPRESSION: Hyperinflation.  No acute cardiopulmonary disease Electronically Signed   By: Jill Side M.D.   On: 06/18/2022 17:24    Procedures Procedures    Medications Ordered in ED Medications  lactated ringers bolus 1,000 mL (1,000 mLs Intravenous New Bag/Given 06/18/22 1751)  alum & mag hydroxide-simeth (MAALOX/MYLANTA) 200-200-20 MG/5ML suspension 30 mL (30 mLs Oral Given 06/18/22 1749)    And  lidocaine (XYLOCAINE) 2 % viscous mouth solution 15 mL  (15 mLs Oral Given 06/18/22 1749)    ED Course/ Medical Decision Making/ A&P                             Medical Decision Making Amount and/or Complexity of Data Reviewed Labs: ordered. Radiology: ordered.  Risk OTC drugs. Prescription drug management.   This patient presents to the ED for concern of nausea, vomiting, chest pain, this involves an extensive number of treatment options, and is a complaint that carries with it a high risk of complications and morbidity.  The differential diagnosis includes side effect of nitrous oxide, GERD, gastritis, ACS   Co morbidities that complicate the patient evaluation  fibromyalgia, bipolar disorder, RLS, IBS, GERD, migraines, arthritis   Additional history obtained:  Additional history obtained from EMS External records from outside source obtained and reviewed including EMR   Lab Tests:  I Ordered, and personally interpreted labs.  The pertinent results include: Normal hemoglobin, no leukocytosis, normal kidney function, normal electrolytes, undetectable troponin, normal lipase   Imaging Studies ordered:  I ordered imaging studies including chest x-ray I independently visualized and interpreted imaging which showed no acute findings I agree with the radiologist interpretation   Cardiac Monitoring: / EKG:  The patient was maintained on a cardiac monitor.  I personally viewed and interpreted the cardiac monitored which showed an underlying rhythm of: Sinus rhythm  Problem List / ED Course / Critical interventions / Medication management  Patient presents for nausea and vomiting.  Onset was while at the dentist office while getting nitrous oxide.  She arrives by EMS.  Nausea has resolved.  She endorses a mild sore throat and cough.  Patient is well-appearing on exam.  Abdomen is soft and nontender.  Oropharynx is normal in appearance.  Will give IV fluids, Zofran, and check labs.  Patient reports that in addition to her nausea,  she did have some chest pain while in the dentist office.  Seems that this was the reason EMS  was called.  Patient states that her chest pain has resolved.  EKG shows no concerning ST segment changes.  Will check troponin as well.  Chest x-ray shows no acute findings.  Laboratory workup is reassuring.  Troponin is undetectable.  Patient had continued resolution of symptoms while in the ED.  She was able to eat and drink.  She states that she has been having her house painted over the past several days.  She feels that she has been inhaling increased dust which may have been noxious stimuli and contributed to her symptoms.  She has had a recent scratchy throat.  She has had poor p.o. intake lately.  Given her reassuring workup and resolution of symptoms, patient is stable for discharge. I ordered medication including IV fluids for hydration; GI cocktail for recent nausea Reevaluation of the patient after these medicines showed that the patient resolved I have reviewed the patients home medicines and have made adjustments as needed   Social Determinants of Health:  Has PCP         Final Clinical Impression(s) / ED Diagnoses Final diagnoses:  Nausea    Rx / DC Orders ED Discharge Orders     None         Godfrey Pick, MD 06/18/22 1946

## 2022-08-03 DIAGNOSIS — H04123 Dry eye syndrome of bilateral lacrimal glands: Secondary | ICD-10-CM | POA: Diagnosis not present

## 2022-08-09 DIAGNOSIS — F411 Generalized anxiety disorder: Secondary | ICD-10-CM | POA: Diagnosis not present

## 2022-08-09 DIAGNOSIS — F3132 Bipolar disorder, current episode depressed, moderate: Secondary | ICD-10-CM | POA: Diagnosis not present

## 2022-08-21 ENCOUNTER — Other Ambulatory Visit: Payer: Self-pay | Admitting: Physician Assistant

## 2022-08-21 NOTE — Telephone Encounter (Signed)
Last Fill: 03/13/2022  Next Visit: 09/27/2022  Last Visit: 03/28/2022  Dx: History of migraine   Current Dose per office note on 03/28/2022: Topamax 25 mg 2 tablets by mouth at bedtime for migraines.   Okay to refill Topamax?

## 2022-09-13 NOTE — Progress Notes (Unsigned)
Office Visit Note  Patient: Felicia Rivera             Date of Birth: 06/30/1970           MRN: [id number]             PCP: Deatra James, MD Referring: Deatra James, MD Visit Date: 09/27/2022 Occupation: @GUAROCC @  Subjective:  Generalized myalgias   History of Present Illness: Felicia Rivera is a 52 y.o. female with history of osteoarthritis and fibromyalgia.  Patient presents today with generalized myalgias and muscle tenderness due to fibromyalgia.  Patient states she has been under tremendous amount of stress since losing both of her parents.  She has been handling their estate and states that the stress level has been causing more frequent flares.  She is currently having trapezius muscle tension tenderness bilaterally.  She is also having intercostal muscle tension and tenderness.  She has ongoing discomfort in her lower back especially in the left paraspinal muscles.  She has been using Biofreeze topically as needed for pain relief.  She states she is also been having increased discomfort in her right index finger for the past 6 months.  She has had some weakness in the left ankle joint at times.    Activities of Daily Living:  Patient reports morning stiffness for 10-30 minutes.   Patient Reports nocturnal pain.  Difficulty dressing/grooming: Denies Difficulty climbing stairs: Denies Difficulty getting out of chair: Denies Difficulty using hands for taps, buttons, cutlery, and/or writing: Reports  Review of Systems  Constitutional:  Positive for fatigue.  HENT:  Positive for mouth dryness. Negative for mouth sores.   Eyes:  Positive for dryness.  Respiratory:  Positive for cough, shortness of breath and wheezing.   Cardiovascular:  Positive for chest pain and palpitations.  Gastrointestinal:  Positive for constipation. Negative for blood in stool and diarrhea.  Endocrine: Negative for increased urination.  Genitourinary:  Negative for involuntary urination.  Musculoskeletal:   Positive for joint pain, joint pain, myalgias, muscle weakness, morning stiffness, muscle tenderness and myalgias. Negative for gait problem and joint swelling.  Skin:  Negative for color change, rash, hair loss and sensitivity to sunlight.  Allergic/Immunologic: Negative for susceptible to infections.  Neurological:  Positive for headaches. Negative for dizziness.  Hematological:  Negative for swollen glands.  Psychiatric/Behavioral:  Positive for depressed mood and sleep disturbance. The patient is nervous/anxious.     PMFS History:  Patient Active Problem List   Diagnosis Date Noted   Other fatigue 07/25/2016   Other insomnia 07/25/2016   Bilateral sacroiliitis (HCC) 07/25/2016   Trochanteric bursitis of both hips 07/25/2016   Fibromyalgia 02/28/2016   Ulnar neuropathy at elbow, left 02/28/2016   Bipolar disorder (HCC) 02/28/2016   Vitamin D deficiency 02/28/2016   Migraine 02/28/2016   RLS (restless legs syndrome) 02/28/2016   IBS (irritable bowel syndrome) 02/28/2016   GERD (gastroesophageal reflux disease) 02/28/2016    Past Medical History:  Diagnosis Date   Arthritis    Bipolar 1 disorder (HCC) 02/28/2016   Bipolar disorder (HCC) 02/28/2016   Bursitis    Fibromyalgia 02/28/2016   GERD (gastroesophageal reflux disease) 02/28/2016   IBS (irritable bowel syndrome) 02/28/2016   Migraine 02/28/2016   Periodic limb movement disorder (PLMD)    RLS (restless legs syndrome) 02/28/2016   Ulnar neuropathy at elbow, left 02/28/2016   Vitamin D deficiency 02/28/2016    Family History  Problem Relation Age of Onset   Kidney failure Mother  Stage 3   Diabetes Mother    Glaucoma Mother    Heart Problems Mother        pacemaker, defibrilator    Hypertension Mother    Congestive Heart Failure Mother    Rheum arthritis Mother        possibly   COPD Mother    Emphysema Mother    Hypertension Father    High Cholesterol Father    Heart Problems Father    Leukemia  Father    Acute myelogenous leukemia Father    Past Surgical History:  Procedure Laterality Date   ABDOMINAL HYSTERECTOMY     dental work  04/16/2017   x2   Social History   Social History Narrative   Left handed   Immunization History  Administered Date(s) Administered   PFIZER(Purple Top)SARS-COV-2 Vaccination 07/18/2019, 08/08/2019, 02/09/2020     Objective: Vital Signs: BP 107/67 (BP Location: Left Arm, Patient Position: Sitting, Cuff Size: Normal)   Pulse 85   Resp 14   Ht 5\' 4"  (1.626 m)   Wt 99 lb 9.6 oz (45.2 kg)   BMI 17.10 kg/m    Physical Exam Vitals and nursing note reviewed.  Constitutional:      Appearance: She is well-developed.  HENT:     Head: Normocephalic and atraumatic.  Eyes:     Conjunctiva/sclera: Conjunctivae normal.  Cardiovascular:     Rate and Rhythm: Normal rate and regular rhythm.     Heart sounds: Normal heart sounds.  Pulmonary:     Effort: Pulmonary effort is normal.     Breath sounds: Normal breath sounds.  Abdominal:     General: Bowel sounds are normal.     Palpations: Abdomen is soft.  Musculoskeletal:     Cervical back: Normal range of motion.  Lymphadenopathy:     Cervical: No cervical adenopathy.  Skin:    General: Skin is warm and dry.     Capillary Refill: Capillary refill takes less than 2 seconds.  Neurological:     Mental Status: She is alert and oriented to person, place, and time.  Psychiatric:        Behavior: Behavior normal.      Musculoskeletal Exam: Generalized hyperalgesia and positive tender points. Shoulder joints, elbow joints, wrist joints, MCPs, PIPs, and DIPs good ROM with no discomfort.  Tenderness of the right 2nd PIP joint.  Hip joints have good ROM with no groin pain.  Tenderness over bilateral trochanteric bursa.  Knee joints have good range of motion with no warmth or effusion.  Ankle joints have good range of motion with some tenderness in the left ankle.  No synovitis was noted.  CDAI  Exam: CDAI Score: -- Patient Global: --; Provider Global: -- Swollen: --; Tender: -- Joint Exam 09/27/2022   No joint exam has been documented for this visit   There is currently no information documented on the homunculus. Go to the Rheumatology activity and complete the homunculus joint exam.  Investigation: No additional findings.  Imaging: No results found.  Recent Labs: Lab Results  Component Value Date   WBC 8.5 06/18/2022   HGB 13.1 06/18/2022   PLT 191 06/18/2022   NA 141 06/18/2022   K 3.7 06/18/2022   CL 109 06/18/2022   CO2 24 06/18/2022   GLUCOSE 116 (H) 06/18/2022   BUN 16 06/18/2022   CREATININE 0.99 06/18/2022   BILITOT 0.5 06/18/2022   ALKPHOS 46 06/18/2022   AST 18 06/18/2022   ALT 9 06/18/2022  PROT 6.7 06/18/2022   ALBUMIN 3.8 06/18/2022   CALCIUM 8.7 (L) 06/18/2022   GFRAA 51 (L) 07/31/2016    Speciality Comments: No specialty comments available.  Procedures:  No procedures performed Allergies: Benztropine, Cefdinir, Clarithromycin, Erythromycin, Hydrocodone, Nsaids, and Pregabalin   Assessment / Plan:     Visit Diagnoses: Primary osteoarthritis of both hands: She has some PIP and DIP prominence consistent with osteoarthritis of both hands.  For the past 6 months has been experiencing increased soreness and stiffness in her right second PIP joint.  On examination today no synovitis or dactylitis was noted.  She was able to make a complete fist bilaterally.  Different treatment options were discussed today including the use of Voltaren gel, arthritis gloves, and hand therapy.  Discussed the importance of joint protection and muscle strengthening.  She was encouraged to perform hand exercises daily.  Trochanteric bursitis of both hips: Ongoing tenderness over bilateral trochanteric bursa.  She may benefit from dry needling or physical therapy.  She will notify us when and if she would like referral to physical therapy placed.  Pes cavus: She is  wearing proper fitting shoes.   Fibromyalgia -She has generalized hyperalgesia and positive tender points on exam.  She continues to experience internal myalgias and muscle tenderness due to fibromyalgia.  She remains under tremendous amount of stress since the loss of both of her parents.  She has been trying to increase her activity level and has been dancing more frequently. She has been taking baclofen very sparingly.  She does not need a refill at this time.  Discussed the importance of regular exercise and good sleep hygiene.  Patient strongly encouraged to resume maintenance massages as well as acupuncture which has been beneficial for her in the past.  Offered a referral to negative therapies but she has declined at this time.  She was advised to notify us if she would like a referral to PT in the future.  Trapezius muscle spasm: She has ongoing trapezius muscle tension and tenderness bilaterally.  She has a prescription for baclofen 10 mg 1 tablet at bedtime as needed for muscle spasms but has been taking it very sparingly.  She does not need a refill at this time.  She will benefit from restarting massage therapy.  Other insomnia: Discussed the importance of good sleep hygiene.  Other fatigue: Chronic-discussed the importance of regular exercise and good sleep hygiene.  Other medical conditions are listed as follows:  History of migraine - evaluated by Dr. Everlena Cooper.She takes Topamax 25 mg by mouth at bedtime for migraines.  RLS (restless legs syndrome)  History of vitamin D deficiency  History of gastroesophageal reflux (GERD)  History of bipolar disorder  History of IBS  Orders: No orders of the defined types were placed in this encounter.  No orders of the defined types were placed in this encounter.    Follow-Up Instructions: Return in about 6 months (around 03/30/2023) for Osteoarthritis, Fibromyalgia.   Gearldine Bienenstock, PA-C  Note - This record has been created using  Dragon software.  Chart creation errors have been sought, but may not always  have been located. Such creation errors do not reflect on  the standard of medical care.

## 2022-09-27 ENCOUNTER — Ambulatory Visit: Payer: Medicare Other | Attending: Physician Assistant | Admitting: Physician Assistant

## 2022-09-27 ENCOUNTER — Encounter: Payer: Self-pay | Admitting: Physician Assistant

## 2022-09-27 VITALS — BP 107/67 | HR 85 | Resp 14 | Ht 64.0 in | Wt 99.6 lb

## 2022-09-27 DIAGNOSIS — M62838 Other muscle spasm: Secondary | ICD-10-CM | POA: Diagnosis not present

## 2022-09-27 DIAGNOSIS — M19041 Primary osteoarthritis, right hand: Secondary | ICD-10-CM | POA: Insufficient documentation

## 2022-09-27 DIAGNOSIS — Q667 Congenital pes cavus, unspecified foot: Secondary | ICD-10-CM

## 2022-09-27 DIAGNOSIS — M19042 Primary osteoarthritis, left hand: Secondary | ICD-10-CM | POA: Diagnosis not present

## 2022-09-27 DIAGNOSIS — M7062 Trochanteric bursitis, left hip: Secondary | ICD-10-CM | POA: Insufficient documentation

## 2022-09-27 DIAGNOSIS — G4709 Other insomnia: Secondary | ICD-10-CM

## 2022-09-27 DIAGNOSIS — Z8639 Personal history of other endocrine, nutritional and metabolic disease: Secondary | ICD-10-CM | POA: Diagnosis not present

## 2022-09-27 DIAGNOSIS — Z8719 Personal history of other diseases of the digestive system: Secondary | ICD-10-CM | POA: Diagnosis not present

## 2022-09-27 DIAGNOSIS — R5383 Other fatigue: Secondary | ICD-10-CM

## 2022-09-27 DIAGNOSIS — G2581 Restless legs syndrome: Secondary | ICD-10-CM | POA: Diagnosis not present

## 2022-09-27 DIAGNOSIS — M7061 Trochanteric bursitis, right hip: Secondary | ICD-10-CM

## 2022-09-27 DIAGNOSIS — Z8659 Personal history of other mental and behavioral disorders: Secondary | ICD-10-CM | POA: Diagnosis not present

## 2022-09-27 DIAGNOSIS — Z8669 Personal history of other diseases of the nervous system and sense organs: Secondary | ICD-10-CM

## 2022-09-27 DIAGNOSIS — M797 Fibromyalgia: Secondary | ICD-10-CM

## 2022-09-28 DIAGNOSIS — Z202 Contact with and (suspected) exposure to infections with a predominantly sexual mode of transmission: Secondary | ICD-10-CM | POA: Diagnosis not present

## 2022-09-28 DIAGNOSIS — Z86001 Personal history of in-situ neoplasm of cervix uteri: Secondary | ICD-10-CM | POA: Diagnosis not present

## 2022-09-28 DIAGNOSIS — Z780 Asymptomatic menopausal state: Secondary | ICD-10-CM | POA: Diagnosis not present

## 2022-09-28 DIAGNOSIS — Z1231 Encounter for screening mammogram for malignant neoplasm of breast: Secondary | ICD-10-CM | POA: Diagnosis not present

## 2022-09-28 DIAGNOSIS — Z01419 Encounter for gynecological examination (general) (routine) without abnormal findings: Secondary | ICD-10-CM | POA: Diagnosis not present

## 2022-09-28 DIAGNOSIS — Z09 Encounter for follow-up examination after completed treatment for conditions other than malignant neoplasm: Secondary | ICD-10-CM | POA: Diagnosis not present

## 2022-09-28 DIAGNOSIS — Z113 Encounter for screening for infections with a predominantly sexual mode of transmission: Secondary | ICD-10-CM | POA: Diagnosis not present

## 2022-10-03 ENCOUNTER — Other Ambulatory Visit: Payer: Self-pay | Admitting: Obstetrics and Gynecology

## 2022-10-03 DIAGNOSIS — R92343 Mammographic extreme density, bilateral breasts: Secondary | ICD-10-CM

## 2022-11-08 DIAGNOSIS — F3132 Bipolar disorder, current episode depressed, moderate: Secondary | ICD-10-CM | POA: Diagnosis not present

## 2022-11-08 DIAGNOSIS — F411 Generalized anxiety disorder: Secondary | ICD-10-CM | POA: Diagnosis not present

## 2022-12-01 ENCOUNTER — Ambulatory Visit
Admission: RE | Admit: 2022-12-01 | Discharge: 2022-12-01 | Disposition: A | Discharge status: Home / Self Care | Payer: Medicare Other | Source: Ambulatory Visit | Attending: Obstetrics and Gynecology | Admitting: Obstetrics and Gynecology

## 2022-12-01 DIAGNOSIS — R92343 Mammographic extreme density, bilateral breasts: Secondary | ICD-10-CM

## 2023-01-03 DIAGNOSIS — J300 Vasomotor rhinitis: Secondary | ICD-10-CM | POA: Diagnosis not present

## 2023-01-03 DIAGNOSIS — J4540 Moderate persistent asthma, uncomplicated: Secondary | ICD-10-CM | POA: Diagnosis not present

## 2023-01-03 DIAGNOSIS — J310 Chronic rhinitis: Secondary | ICD-10-CM | POA: Diagnosis not present

## 2023-01-03 DIAGNOSIS — H1045 Other chronic allergic conjunctivitis: Secondary | ICD-10-CM | POA: Diagnosis not present

## 2023-02-07 DIAGNOSIS — F411 Generalized anxiety disorder: Secondary | ICD-10-CM | POA: Diagnosis not present

## 2023-02-07 DIAGNOSIS — F3132 Bipolar disorder, current episode depressed, moderate: Secondary | ICD-10-CM | POA: Diagnosis not present

## 2023-02-18 ENCOUNTER — Other Ambulatory Visit: Payer: Self-pay | Admitting: Rheumatology

## 2023-02-18 NOTE — Telephone Encounter (Signed)
Last Fill: 08/21/2022  Next Visit: 04/04/2023  Last Visit: 09/27/2022  Dx: History of migraine   Current Dose per office note on 09/27/2022: Topamax 25 mg by mouth at bedtime for migraines.   Okay to refill Topamax?

## 2023-03-25 NOTE — Progress Notes (Unsigned)
Office Visit Note  Patient: Felicia Rivera             Date of Birth: [birth year]           MRN: [id number]             PCP: Deatra James, MD Referring: Deatra James, MD Visit Date: 04/04/2023 Occupation: @GUAROCC @  Subjective:  Routine follow up   History of Present Illness: Felicia Rivera is a 52 y.o. female with history of osteoarthritis and fibromyalgia.  Patient is currently experiencing generalized myalgias and muscle tenderness due to fibromyalgia.  Patient continues to dance on a regular basis for exercise and was in a showcase in September.  She states that she performed 3 different dances during the performance.  She states that after completing the performance she had significant myofascial pain and fatigue and had to rest for 1 week.  She states that she took baclofen during that time as needed for muscle spasms and pain relief.  She has been using Biofreeze patches as needed and also purchased a new TENS unit which has been helpful.  Patient states that she has been experiencing increased headaches and muscle tension in her trapezius muscles bilaterally.  Patient states that her headaches have been more frequent since reducing the dose of Topamax from 50 mg at bedtime to 25 mg at bedtime.  She would like to discuss increasing the dose of Topamax back to 50 mg at bedtime.   Activities of Daily Living:  Patient reports morning stiffness for 20-30 minutes.   Patient Reports nocturnal pain.  Difficulty dressing/grooming: Denies Difficulty climbing stairs: Denies Difficulty getting out of chair: Denies Difficulty using hands for taps, buttons, cutlery, and/or writing: Denies  Review of Systems  Constitutional:  Positive for fatigue.  HENT:  Positive for mouth dryness. Negative for mouth sores.   Eyes:  Positive for dryness.  Respiratory:  Positive for cough and wheezing.   Cardiovascular:  Negative for palpitations.  Gastrointestinal:  Positive for constipation. Negative for blood  in stool and diarrhea.  Endocrine: Negative for increased urination.  Genitourinary:  Negative for involuntary urination.  Musculoskeletal:  Positive for joint pain, joint pain, myalgias, muscle weakness, morning stiffness, muscle tenderness and myalgias. Negative for gait problem and joint swelling.  Skin:  Negative for color change, rash, hair loss and sensitivity to sunlight.  Allergic/Immunologic: Negative for susceptible to infections.  Neurological:  Positive for headaches. Negative for dizziness.  Hematological:  Negative for swollen glands.  Psychiatric/Behavioral:  Positive for depressed mood and sleep disturbance. The patient is nervous/anxious.     PMFS History:  Patient Active Problem List   Diagnosis Date Noted   Other fatigue 07/25/2016   Other insomnia 07/25/2016   Bilateral sacroiliitis (HCC) 07/25/2016   Trochanteric bursitis of both hips 07/25/2016   Fibromyalgia 02/28/2016   Ulnar neuropathy at elbow, left 02/28/2016   Bipolar disorder (HCC) 02/28/2016   Vitamin D deficiency 02/28/2016   Migraine 02/28/2016   RLS (restless legs syndrome) 02/28/2016   IBS (irritable bowel syndrome) 02/28/2016   GERD (gastroesophageal reflux disease) 02/28/2016    Past Medical History:  Diagnosis Date   Arthritis    Bipolar 1 disorder (HCC) 02/28/2016   Bipolar disorder (HCC) 02/28/2016   Bursitis    Fibromyalgia 02/28/2016   GERD (gastroesophageal reflux disease) 02/28/2016   IBS (irritable bowel syndrome) 02/28/2016   Migraine 02/28/2016   Periodic limb movement disorder (PLMD)    RLS (restless legs syndrome) 02/28/2016  Ulnar neuropathy at elbow, left 02/28/2016   Vitamin D deficiency 02/28/2016    Family History  Problem Relation Age of Onset   Kidney failure Mother        Stage 3   Diabetes Mother    Glaucoma Mother    Heart Problems Mother        pacemaker, defibrilator    Hypertension Mother    Congestive Heart Failure Mother    Rheum arthritis Mother         possibly   COPD Mother    Emphysema Mother    Hypertension Father    High Cholesterol Father    Heart Problems Father    Leukemia Father    Acute myelogenous leukemia Father    Past Surgical History:  Procedure Laterality Date   ABDOMINAL HYSTERECTOMY     dental work  04/16/2017   x2   Social History   Social History Narrative   Left handed   Immunization History  Administered Date(s) Administered   PFIZER(Purple Top)SARS-COV-2 Vaccination 07/18/2019, 08/08/2019, 02/09/2020     Objective: Vital Signs: BP 100/70 (BP Location: Left Arm, Patient Position: Sitting, Cuff Size: Normal)   Pulse 92   Resp 14   Ht 5\' 4"  (1.626 m)   Wt 99 lb 3.2 oz (45 kg)   BMI 17.03 kg/m    Physical Exam Vitals and nursing note reviewed.  Constitutional:      Appearance: She is well-developed.  HENT:     Head: Normocephalic and atraumatic.  Eyes:     Conjunctiva/sclera: Conjunctivae normal.  Cardiovascular:     Rate and Rhythm: Normal rate and regular rhythm.     Heart sounds: Normal heart sounds.  Pulmonary:     Effort: Pulmonary effort is normal.     Breath sounds: Normal breath sounds.  Abdominal:     General: Bowel sounds are normal.     Palpations: Abdomen is soft.  Musculoskeletal:     Cervical back: Normal range of motion.  Lymphadenopathy:     Cervical: No cervical adenopathy.  Skin:    General: Skin is warm and dry.     Capillary Refill: Capillary refill takes less than 2 seconds.  Neurological:     Mental Status: She is alert and oriented to person, place, and time.  Psychiatric:        Behavior: Behavior normal.      Musculoskeletal Exam: C-spine has limited ROM with discomfort. Thoracic spine and lumbar spine have good ROM.  Shoulder joints, elbow joints, wrist joints, MCPs, PIPs, and DIPs good ROM with no synovitis.  Complete fist formation bilaterally.  Hip joints have good ROM with no groin pain.  Mild tenderness over the right trochanteric bursa.  Knee joints  have good ROM with no warmth or effusion.  Ankle joints have good ROM with no tenderness or joint swelling.   CDAI Exam: CDAI Score: -- Patient Global: --; Provider Global: -- Swollen: --; Tender: -- Joint Exam 04/04/2023   No joint exam has been documented for this visit   There is currently no information documented on the homunculus. Go to the Rheumatology activity and complete the homunculus joint exam.  Investigation: No additional findings.  Imaging: No results found.  Recent Labs: Lab Results  Component Value Date   WBC 8.5 06/18/2022   HGB 13.1 06/18/2022   PLT 191 06/18/2022   NA 141 06/18/2022   K 3.7 06/18/2022   CL 109 06/18/2022   CO2 24 06/18/2022   GLUCOSE  116 (H) 06/18/2022   BUN 16 06/18/2022   CREATININE 0.99 06/18/2022   BILITOT 0.5 06/18/2022   ALKPHOS 46 06/18/2022   AST 18 06/18/2022   ALT 9 06/18/2022   PROT 6.7 06/18/2022   ALBUMIN 3.8 06/18/2022   CALCIUM 8.7 (L) 06/18/2022   GFRAA 51 (L) 07/31/2016    Speciality Comments: No specialty comments available.  Procedures:  No procedures performed Allergies: Benztropine, Cefdinir, Clarithromycin, Erythromycin, Hydrocodone, Nsaids, and Pregabalin   Assessment / Plan:     Visit Diagnoses: Primary osteoarthritis of both hands: No synovitis noted on examination today.  Complete fist formation bilaterally.  Discussed the importance of joint protection and muscle strengthening.  Trochanteric bursitis of both hips: Mild tenderness to palpation over bilateral trochanteric bursa, right greater than left.  Pes cavus: She is wearing proper fitting shoes.   Fibromyalgia: Patient continues to experience intermittent myalgias and muscle tenderness due to fibromyalgia.  During flares she experiences increased myofascial pain, muscle spasms, and fatigue.  She takes baclofen as needed during flares.  She remains on Topamax 25 mg at bedtime but has been experiencing an increased frequency of migraines since  reducing the dose of Topamax.  Plan to increase Topamax back to 50 mg at bedtime. She has been using a TENS unit as well as Biofreeze patches for pain relief. Discussed the importance of regular exercise and good sleep hygiene.  Trapezius muscle spasm: Patient is experiencing trapezius muscle tension and tenderness bilaterally.  She has been experiencing muscle spasms as follows an increased frequency of migraines recently.  Patient has tried trigger point injections in the past which exacerbated her symptoms.  She has been unable to go for a massage recently.  She has been using Biofreeze pain patches for pain relief. She will remain on baclofen as prescribed.  Other fatigue: Chronic, stable. Discussed the importance of regular exercise.   Other insomnia: Discussed the importance of good sleep hygiene.   History of migraine - Evaluated by Dr. Everlena Cooper.She takes Topamax 25 mg by mouth at bedtime for migraines.  She reduced the dose of Topamax from 50 mg to 25 mg at bedtime but has had an increased frequency of migraines.  Plan to increase Topamax back to 50 mg at bedtime.  Other medical conditions are listed as follows:   History of vitamin D deficiency  RLS (restless legs syndrome)  History of bipolar disorder  History of gastroesophageal reflux (GERD)  History of IBS  Orders: No orders of the defined types were placed in this encounter.  No orders of the defined types were placed in this encounter.    Follow-Up Instructions: Return in about 6 months (around 10/03/2023) for Fibromyalgia, Osteoarthritis.   Gearldine Bienenstock, PA-C  Note - This record has been created using Dragon software.  Chart creation errors have been sought, but may not always  have been located. Such creation errors do not reflect on  the standard of medical care.

## 2023-04-04 ENCOUNTER — Ambulatory Visit: Payer: Medicare Other | Attending: Rheumatology | Admitting: Physician Assistant

## 2023-04-04 ENCOUNTER — Encounter: Payer: Self-pay | Admitting: Physician Assistant

## 2023-04-04 VITALS — BP 100/70 | HR 92 | Resp 14 | Ht 64.0 in | Wt 99.2 lb

## 2023-04-04 DIAGNOSIS — M7061 Trochanteric bursitis, right hip: Secondary | ICD-10-CM | POA: Diagnosis not present

## 2023-04-04 DIAGNOSIS — M797 Fibromyalgia: Secondary | ICD-10-CM | POA: Insufficient documentation

## 2023-04-04 DIAGNOSIS — Q667 Congenital pes cavus, unspecified foot: Secondary | ICD-10-CM | POA: Diagnosis not present

## 2023-04-04 DIAGNOSIS — Z8719 Personal history of other diseases of the digestive system: Secondary | ICD-10-CM | POA: Diagnosis not present

## 2023-04-04 DIAGNOSIS — G2581 Restless legs syndrome: Secondary | ICD-10-CM | POA: Diagnosis not present

## 2023-04-04 DIAGNOSIS — M19042 Primary osteoarthritis, left hand: Secondary | ICD-10-CM | POA: Insufficient documentation

## 2023-04-04 DIAGNOSIS — Z8639 Personal history of other endocrine, nutritional and metabolic disease: Secondary | ICD-10-CM | POA: Insufficient documentation

## 2023-04-04 DIAGNOSIS — G4709 Other insomnia: Secondary | ICD-10-CM | POA: Insufficient documentation

## 2023-04-04 DIAGNOSIS — M19041 Primary osteoarthritis, right hand: Secondary | ICD-10-CM | POA: Insufficient documentation

## 2023-04-04 DIAGNOSIS — Z8659 Personal history of other mental and behavioral disorders: Secondary | ICD-10-CM | POA: Insufficient documentation

## 2023-04-04 DIAGNOSIS — M7062 Trochanteric bursitis, left hip: Secondary | ICD-10-CM | POA: Insufficient documentation

## 2023-04-04 DIAGNOSIS — Z8669 Personal history of other diseases of the nervous system and sense organs: Secondary | ICD-10-CM | POA: Insufficient documentation

## 2023-04-04 DIAGNOSIS — R5383 Other fatigue: Secondary | ICD-10-CM | POA: Insufficient documentation

## 2023-04-04 DIAGNOSIS — M62838 Other muscle spasm: Secondary | ICD-10-CM | POA: Insufficient documentation

## 2023-04-05 ENCOUNTER — Other Ambulatory Visit: Payer: Self-pay

## 2023-04-05 MED ORDER — TOPIRAMATE 25 MG PO TABS: 50.0000 mg | ORAL_TABLET | Freq: Every day | ORAL | 0 refills | Status: DC

## 2023-04-05 NOTE — Telephone Encounter (Signed)
Please review and sign "no print" topamax rx to reflect dose change that you advised at the appointment yesterday. Thanks!

## 2023-04-26 ENCOUNTER — Other Ambulatory Visit: Payer: Self-pay | Admitting: Physician Assistant

## 2023-04-26 ENCOUNTER — Other Ambulatory Visit: Payer: Self-pay | Admitting: *Deleted

## 2023-04-26 MED ORDER — TOPIRAMATE 25 MG PO TABS: 50.0000 mg | ORAL_TABLET | Freq: Every day | ORAL | 0 refills | Status: DC

## 2023-04-26 NOTE — Telephone Encounter (Signed)
Last Fill: 02/18/2023  Next Visit: 10/10/2023  Last Visit: 04/04/2023  Dx: Fibromyalgia   Current Dose per office note on 04/04/2023: Topamax back to 50 mg at bedtime   Okay to refill Topamax?

## 2023-07-04 ENCOUNTER — Other Ambulatory Visit: Payer: Self-pay | Admitting: Physician Assistant

## 2023-07-24 ENCOUNTER — Other Ambulatory Visit: Payer: Self-pay | Admitting: Physician Assistant

## 2023-07-24 NOTE — Telephone Encounter (Signed)
 Last Fill: 04/26/2023   Next Visit: 10/10/2023   Last Visit: 04/04/2023   Dx: Fibromyalgia    Current Dose per office note on 04/04/2023: Topamax back to 50 mg at bedtime    Okay to refill Topamax?

## 2023-09-27 NOTE — Progress Notes (Deleted)
 Office Visit Note  Patient: Felicia Rivera             Date of Birth: April 05, 1971           MRN: [id number]             PCP: Sun, Vyvyan, MD Referring: Sun, Vyvyan, MD Visit Date: 10/10/2023 Occupation: @GUAROCC @  Subjective:  No chief complaint on file.   History of Present Illness: Felicia Rivera is a 53 y.o. female ***     Activities of Daily Living:  Patient reports morning stiffness for *** {minute/hour:19697}.   Patient {ACTIONS;DENIES/REPORTS:21021675::"Denies"} nocturnal pain.  Difficulty dressing/grooming: {ACTIONS;DENIES/REPORTS:21021675::"Denies"} Difficulty climbing stairs: {ACTIONS;DENIES/REPORTS:21021675::"Denies"} Difficulty getting out of chair: {ACTIONS;DENIES/REPORTS:21021675::"Denies"} Difficulty using hands for taps, buttons, cutlery, and/or writing: {ACTIONS;DENIES/REPORTS:21021675::"Denies"}  No Rheumatology ROS completed.   PMFS History:  Patient Active Problem List   Diagnosis Date Noted   Other fatigue 07/25/2016   Other insomnia 07/25/2016   Bilateral sacroiliitis (HCC) 07/25/2016   Trochanteric bursitis of both hips 07/25/2016   Fibromyalgia 02/28/2016   Ulnar neuropathy at elbow, left 02/28/2016   Bipolar disorder (HCC) 02/28/2016   Vitamin D  deficiency 02/28/2016   Migraine 02/28/2016   RLS (restless legs syndrome) 02/28/2016   IBS (irritable bowel syndrome) 02/28/2016   GERD (gastroesophageal reflux disease) 02/28/2016    Past Medical History:  Diagnosis Date   Arthritis    Bipolar 1 disorder (HCC) 02/28/2016   Bipolar disorder (HCC) 02/28/2016   Bursitis    Fibromyalgia 02/28/2016   GERD (gastroesophageal reflux disease) 02/28/2016   IBS (irritable bowel syndrome) 02/28/2016   Migraine 02/28/2016   Periodic limb movement disorder (PLMD)    RLS (restless legs syndrome) 02/28/2016   Ulnar neuropathy at elbow, left 02/28/2016   Vitamin D  deficiency 02/28/2016    Family History  Problem Relation Age of Onset   Kidney failure  Mother        Stage 3   Diabetes Mother    Glaucoma Mother    Heart Problems Mother        pacemaker, defibrilator    Hypertension Mother    Congestive Heart Failure Mother    Rheum arthritis Mother        possibly   COPD Mother    Emphysema Mother    Hypertension Father    High Cholesterol Father    Heart Problems Father    Leukemia Father    Acute myelogenous leukemia Father    Past Surgical History:  Procedure Laterality Date   ABDOMINAL HYSTERECTOMY     dental work  04/16/2017   x2   Social History   Social History Narrative   Left handed   Immunization History  Administered Date(s) Administered   PFIZER(Purple Top)SARS-COV-2 Vaccination 07/18/2019, 08/08/2019, 02/09/2020     Objective: Vital Signs: There were no vitals taken for this visit.   Physical Exam   Musculoskeletal Exam: ***  CDAI Exam: CDAI Score: -- Patient Global: --; Provider Global: -- Swollen: --; Tender: -- Joint Exam 10/10/2023   No joint exam has been documented for this visit   There is currently no information documented on the homunculus. Go to the Rheumatology activity and complete the homunculus joint exam.  Investigation: No additional findings.  Imaging: No results found.  Recent Labs: Lab Results  Component Value Date   WBC 8.5 06/18/2022   HGB 13.1 06/18/2022   PLT 191 06/18/2022   NA 141 06/18/2022   K 3.7 06/18/2022   CL 109 06/18/2022   CO2  24 06/18/2022   GLUCOSE 116 (H) 06/18/2022   BUN 16 06/18/2022   CREATININE 0.99 06/18/2022   BILITOT 0.5 06/18/2022   ALKPHOS 46 06/18/2022   AST 18 06/18/2022   ALT 9 06/18/2022   PROT 6.7 06/18/2022   ALBUMIN 3.8 06/18/2022   CALCIUM 8.7 (L) 06/18/2022   GFRAA 51 (L) 07/31/2016    Speciality Comments: No specialty comments available.  Procedures:  No procedures performed Allergies: Benztropine , Cefdinir, Clarithromycin, Erythromycin, Hydrocodone, Nsaids, and Pregabalin   Assessment / Plan:     Visit  Diagnoses: No diagnosis found.  Orders: No orders of the defined types were placed in this encounter.  No orders of the defined types were placed in this encounter.   Face-to-face time spent with patient was *** minutes. Greater than 50% of time was spent in counseling and coordination of care.  Follow-Up Instructions: No follow-ups on file.   Dee Farber, CMA  Note - This record has been created using Animal nutritionist.  Chart creation errors have been sought, but may not always  have been located. Such creation errors do not reflect on  the standard of medical care.

## 2023-10-08 NOTE — Progress Notes (Signed)
 Office Visit Note  Patient: Felicia Rivera             Date of Birth: 1971-01-05           MRN: [id number]             PCP: Sun, Vyvyan, MD Referring: Sun, Vyvyan, MD Visit Date: 10/17/2023 Occupation: @GUAROCC @  Subjective:  Myofascial pain  History of Present Illness: CHANYA CHRISLEY is a 53 y.o. female with history of osteoarthritis and fibromyalgia.  Patient was last seen in the office on 04/04/2023.  Patient has been under tremendous amount of stress since her last office visit.  Patient states that this level of stress has been causing increased depression as well as issues with concentration.  She has also had less of an appetite and has had to prioritize increasing her protein intake to prevent further weight loss.  Patient states that she was recently evaluated by her gynecologist and had an updated mammogram and Pap smear which were normal.   She continues to have generalized myalgias and muscle tenderness due to fibromyalgia.  Patient has noticed an improvement in her symptoms since increasing the Topamax  dose back to 50 mg at bedtime.  She takes baclofen  sparingly for muscle spasms.  She continues to have chronic fatigue on a daily basis.    Activities of Daily Living:  Patient reports morning stiffness for 20 minutes.   Patient Reports nocturnal pain.  Difficulty dressing/grooming: Denies Difficulty climbing stairs: Denies Difficulty getting out of chair: Denies Difficulty using hands for taps, buttons, cutlery, and/or writing: Reports  Review of Systems  Constitutional:  Positive for fatigue.  HENT:  Positive for mouth dryness. Negative for mouth sores.   Eyes:  Positive for dryness.  Cardiovascular:  Negative for palpitations.  Gastrointestinal:  Positive for constipation. Negative for blood in stool and diarrhea.  Endocrine: Negative for increased urination.  Genitourinary:  Negative for involuntary urination.  Musculoskeletal:  Positive for joint pain, gait problem,  joint pain, joint swelling, myalgias, muscle weakness, morning stiffness, muscle tenderness and myalgias.  Skin:  Positive for sensitivity to sunlight. Negative for color change, rash and hair loss.  Allergic/Immunologic: Negative for susceptible to infections.  Neurological:  Positive for headaches. Negative for dizziness.  Hematological:  Negative for swollen glands.  Psychiatric/Behavioral:  Positive for depressed mood and sleep disturbance. The patient is nervous/anxious.     PMFS History:  Patient Active Problem List   Diagnosis Date Noted   Other fatigue 07/25/2016   Other insomnia 07/25/2016   Bilateral sacroiliitis (HCC) 07/25/2016   Trochanteric bursitis of both hips 07/25/2016   Fibromyalgia 02/28/2016   Ulnar neuropathy at elbow, left 02/28/2016   Bipolar disorder (HCC) 02/28/2016   Vitamin D  deficiency 02/28/2016   Migraine 02/28/2016   RLS (restless legs syndrome) 02/28/2016   IBS (irritable bowel syndrome) 02/28/2016   GERD (gastroesophageal reflux disease) 02/28/2016    Past Medical History:  Diagnosis Date   Arthritis    Bipolar 1 disorder (HCC) 02/28/2016   Bipolar disorder (HCC) 02/28/2016   Bursitis    Fibromyalgia 02/28/2016   GERD (gastroesophageal reflux disease) 02/28/2016   IBS (irritable bowel syndrome) 02/28/2016   Migraine 02/28/2016   Periodic limb movement disorder (PLMD)    RLS (restless legs syndrome) 02/28/2016   Ulnar neuropathy at elbow, left 02/28/2016   Vitamin D  deficiency 02/28/2016    Family History  Problem Relation Age of Onset   Kidney failure Mother  Stage 3   Diabetes Mother    Glaucoma Mother    Heart Problems Mother        pacemaker, defibrilator    Hypertension Mother    Congestive Heart Failure Mother    Rheum arthritis Mother        possibly   COPD Mother    Emphysema Mother    Hypertension Father    High Cholesterol Father    Heart Problems Father    Leukemia Father    Acute myelogenous leukemia Father     Past Surgical History:  Procedure Laterality Date   ABDOMINAL HYSTERECTOMY     dental work  04/16/2017   x2   Social History   Social History Narrative   Left handed   Immunization History  Administered Date(s) Administered   PFIZER(Purple Top)SARS-COV-2 Vaccination 07/18/2019, 08/08/2019, 02/09/2020     Objective: Vital Signs: BP 103/69 (BP Location: Left Arm, Patient Position: Sitting, Cuff Size: Normal)   Pulse 93   Resp 14   Ht 5' 4 (1.626 m)   Wt 94 lb (42.6 kg)   BMI 16.14 kg/m    Physical Exam Vitals and nursing note reviewed.  Constitutional:      Appearance: She is well-developed.  HENT:     Head: Normocephalic and atraumatic.   Eyes:     Conjunctiva/sclera: Conjunctivae normal.    Cardiovascular:     Rate and Rhythm: Normal rate and regular rhythm.     Heart sounds: Normal heart sounds.  Pulmonary:     Effort: Pulmonary effort is normal.     Breath sounds: Normal breath sounds.  Abdominal:     General: Bowel sounds are normal.     Palpations: Abdomen is soft.   Musculoskeletal:     Cervical back: Normal range of motion.  Lymphadenopathy:     Cervical: No cervical adenopathy.   Skin:    General: Skin is warm and dry.     Capillary Refill: Capillary refill takes less than 2 seconds.   Neurological:     Mental Status: She is alert and oriented to person, place, and time.   Psychiatric:        Behavior: Behavior normal.      Musculoskeletal Exam: Generalized hyperalgesia and positive tender points on exam.  C-spine has limited range of motion with lateral rotation.  Trapezius muscle tension and tenderness bilaterally.  Shoulder joints, elbow joints, wrist joints, MCPs, PIPs, DIPs have good range of motion with no synovitis.  Complete fist formation bilaterally.  Hip joints have good range of motion with some discomfort in the left hip.  Knee joints have good range of motion no warmth or effusion.  Ankle joints have good range of motion with no  tenderness or joint swelling.  CDAI Exam: CDAI Score: -- Patient Global: --; Provider Global: -- Swollen: --; Tender: -- Joint Exam 10/17/2023   No joint exam has been documented for this visit   There is currently no information documented on the homunculus. Go to the Rheumatology activity and complete the homunculus joint exam.  Investigation: No additional findings.  Imaging: No results found.  Recent Labs: Lab Results  Component Value Date   WBC 8.5 06/18/2022   HGB 13.1 06/18/2022   PLT 191 06/18/2022   NA 141 06/18/2022   K 3.7 06/18/2022   CL 109 06/18/2022   CO2 24 06/18/2022   GLUCOSE 116 (H) 06/18/2022   BUN 16 06/18/2022   CREATININE 0.99 06/18/2022   BILITOT 0.5 06/18/2022  ALKPHOS 46 06/18/2022   AST 18 06/18/2022   ALT 9 06/18/2022   PROT 6.7 06/18/2022   ALBUMIN 3.8 06/18/2022   CALCIUM 8.7 (L) 06/18/2022   GFRAA 51 (L) 07/31/2016    Speciality Comments: No specialty comments available.  Procedures:  No procedures performed Allergies: Benztropine , Cefdinir, Clarithromycin, Erythromycin, Hydrocodone, Nsaids, and Pregabalin   Assessment / Plan:     Visit Diagnoses: Primary osteoarthritis of both hands: No joint tenderness or synovitis noted on examination today.  She has noticed increased stiffness in both hands first thing in the mornings.  Discussed the importance of joint protection and muscle strengthening.  Trochanteric bursitis of both hips: She continues to experience intermittent discomfort in both hips, left greater than right.  On examination today she has good range of motion of both hip joints with mild discomfort in the left hip.  Pes cavus: She is wearing proper fitting shoes.  Other insomnia: Patient continues to have interrupted sleep at night.  Discussed the importance of good sleep hygiene.  Fibromyalgia: She has generalized hyperalgesia and positive tender points on examination.  She continues to have frequent flares which she  attributes to her stress levels.  Patient will be reestablishing with a psychiatrist in 2 weeks.  Discussed the importance of regular exercise, good sleep hygiene, and stress management.  Patient has noticed an improvement since increasing the dose of Topamax  back to 50 mg at bedtime.  A refill of Topamax  was sent to the pharmacy today.  Trapezius muscle spasm: She has trapezius muscle tension and tenderness bilaterally.  She experiences muscle spasms intermittently.  She takes baclofen  sparingly for symptomatic relief.  Other fatigue: Patient continues to have chronic fatigue which she attributes to interrupted sleep at night.  Discussed the importance of regular exercise and good sleep hygiene.  Other medical conditions are listed as follows:  History of migraine - Evaluated by Dr. Skeet.she is taking Topamax  25 mg 2 tablets at bedtime.  History of vitamin D  deficiency  RLS (restless legs syndrome)  History of bipolar disorder  History of gastroesophageal reflux (GERD)  History of IBS  Orders: No orders of the defined types were placed in this encounter.  Meds ordered this encounter  Medications   topiramate  (TOPAMAX ) 25 MG tablet    Sig: Take 2 tablets (50 mg total) by mouth at bedtime.    Dispense:  180 tablet    Refill:  0     Follow-Up Instructions: Return in about 6 months (around 04/17/2024) for Fibromyalgia, Osteoarthritis.   Waddell CHRISTELLA Craze, PA-C  Note - This record has been created using Dragon software.  Chart creation errors have been sought, but may not always  have been located. Such creation errors do not reflect on  the standard of medical care.

## 2023-10-10 ENCOUNTER — Ambulatory Visit: Payer: Medicare Other | Admitting: Rheumatology

## 2023-10-10 DIAGNOSIS — M797 Fibromyalgia: Secondary | ICD-10-CM

## 2023-10-10 DIAGNOSIS — Z8659 Personal history of other mental and behavioral disorders: Secondary | ICD-10-CM

## 2023-10-10 DIAGNOSIS — Z8639 Personal history of other endocrine, nutritional and metabolic disease: Secondary | ICD-10-CM

## 2023-10-10 DIAGNOSIS — Z8719 Personal history of other diseases of the digestive system: Secondary | ICD-10-CM

## 2023-10-10 DIAGNOSIS — M62838 Other muscle spasm: Secondary | ICD-10-CM

## 2023-10-10 DIAGNOSIS — M19041 Primary osteoarthritis, right hand: Secondary | ICD-10-CM

## 2023-10-10 DIAGNOSIS — G2581 Restless legs syndrome: Secondary | ICD-10-CM

## 2023-10-10 DIAGNOSIS — M7061 Trochanteric bursitis, right hip: Secondary | ICD-10-CM

## 2023-10-10 DIAGNOSIS — R5383 Other fatigue: Secondary | ICD-10-CM

## 2023-10-10 DIAGNOSIS — Z8669 Personal history of other diseases of the nervous system and sense organs: Secondary | ICD-10-CM

## 2023-10-10 DIAGNOSIS — Q667 Congenital pes cavus, unspecified foot: Secondary | ICD-10-CM

## 2023-10-10 DIAGNOSIS — G4709 Other insomnia: Secondary | ICD-10-CM

## 2023-10-17 ENCOUNTER — Encounter: Payer: Self-pay | Admitting: Physician Assistant

## 2023-10-17 ENCOUNTER — Ambulatory Visit: Admitting: Physician Assistant

## 2023-10-17 VITALS — BP 103/69 | HR 93 | Resp 14 | Ht 64.0 in | Wt 94.0 lb

## 2023-10-17 DIAGNOSIS — Z8639 Personal history of other endocrine, nutritional and metabolic disease: Secondary | ICD-10-CM | POA: Insufficient documentation

## 2023-10-17 DIAGNOSIS — Z8669 Personal history of other diseases of the nervous system and sense organs: Secondary | ICD-10-CM | POA: Diagnosis present

## 2023-10-17 DIAGNOSIS — Q667 Congenital pes cavus, unspecified foot: Secondary | ICD-10-CM | POA: Insufficient documentation

## 2023-10-17 DIAGNOSIS — M797 Fibromyalgia: Secondary | ICD-10-CM

## 2023-10-17 DIAGNOSIS — M62838 Other muscle spasm: Secondary | ICD-10-CM | POA: Diagnosis present

## 2023-10-17 DIAGNOSIS — R5383 Other fatigue: Secondary | ICD-10-CM | POA: Diagnosis present

## 2023-10-17 DIAGNOSIS — Z8719 Personal history of other diseases of the digestive system: Secondary | ICD-10-CM | POA: Insufficient documentation

## 2023-10-17 DIAGNOSIS — M7061 Trochanteric bursitis, right hip: Secondary | ICD-10-CM | POA: Insufficient documentation

## 2023-10-17 DIAGNOSIS — M7062 Trochanteric bursitis, left hip: Secondary | ICD-10-CM | POA: Diagnosis present

## 2023-10-17 DIAGNOSIS — G4709 Other insomnia: Secondary | ICD-10-CM

## 2023-10-17 DIAGNOSIS — Z8659 Personal history of other mental and behavioral disorders: Secondary | ICD-10-CM | POA: Insufficient documentation

## 2023-10-17 DIAGNOSIS — M19042 Primary osteoarthritis, left hand: Secondary | ICD-10-CM | POA: Insufficient documentation

## 2023-10-17 DIAGNOSIS — G2581 Restless legs syndrome: Secondary | ICD-10-CM | POA: Insufficient documentation

## 2023-10-17 DIAGNOSIS — M19041 Primary osteoarthritis, right hand: Secondary | ICD-10-CM

## 2023-10-17 MED ORDER — TOPIRAMATE 25 MG PO TABS: 50.0000 mg | ORAL_TABLET | Freq: Every day | ORAL | 0 refills | Status: DC

## 2023-10-27 ENCOUNTER — Emergency Department (HOSPITAL_COMMUNITY)
Admission: EM | Admit: 2023-10-27 | Discharge: 2023-10-27 | Disposition: A | Discharge status: Home / Self Care | Source: Home / Self Care | Attending: Student | Admitting: Student

## 2023-10-27 ENCOUNTER — Emergency Department (HOSPITAL_COMMUNITY)

## 2023-10-27 ENCOUNTER — Other Ambulatory Visit: Payer: Self-pay

## 2023-10-27 ENCOUNTER — Encounter (HOSPITAL_COMMUNITY): Payer: Self-pay | Admitting: Emergency Medicine

## 2023-10-27 DIAGNOSIS — R0789 Other chest pain: Secondary | ICD-10-CM | POA: Insufficient documentation

## 2023-10-27 DIAGNOSIS — N201 Calculus of ureter: Secondary | ICD-10-CM | POA: Diagnosis not present

## 2023-10-27 DIAGNOSIS — R630 Anorexia: Secondary | ICD-10-CM | POA: Insufficient documentation

## 2023-10-27 DIAGNOSIS — E876 Hypokalemia: Secondary | ICD-10-CM | POA: Insufficient documentation

## 2023-10-27 DIAGNOSIS — N3000 Acute cystitis without hematuria: Secondary | ICD-10-CM | POA: Diagnosis not present

## 2023-10-27 LAB — BASIC METABOLIC PANEL WITH GFR
Anion gap: 9 (ref 5–15)
BUN: 13 mg/dL (ref 6–20)
CO2: 23 mmol/L (ref 22–32)
Calcium: 9.6 mg/dL (ref 8.9–10.3)
Chloride: 105 mmol/L (ref 98–111)
Creatinine, Ser: 1.28 mg/dL — ABNORMAL HIGH (ref 0.44–1.00)
GFR, Estimated: 50 mL/min — ABNORMAL LOW (ref >60)
Glucose, Bld: 119 mg/dL — ABNORMAL HIGH (ref 70–99)
Potassium: 3.3 mmol/L — ABNORMAL LOW (ref 3.5–5.1)
Sodium: 137 mmol/L (ref 135–145)

## 2023-10-27 LAB — CBC
HCT: 43.5 % (ref 36.0–46.0)
Hemoglobin: 14.2 g/dL (ref 12.0–15.0)
MCH: 31.2 pg (ref 26.0–34.0)
MCHC: 32.6 g/dL (ref 30.0–36.0)
MCV: 95.6 fL (ref 80.0–100.0)
Platelets: 201 10*3/uL (ref 150–400)
RBC: 4.55 MIL/uL (ref 3.87–5.11)
RDW: 12 % (ref 11.5–15.5)
WBC: 9 10*3/uL (ref 4.0–10.5)
nRBC: 0 % (ref 0.0–0.2)

## 2023-10-27 LAB — TROPONIN I (HIGH SENSITIVITY)
Troponin I (High Sensitivity): 2 ng/L (ref <18)
Troponin I (High Sensitivity): <2 ng/L (ref <18)

## 2023-10-27 NOTE — ED Provider Notes (Signed)
 [place] EMERGENCY DEPARTMENT AT [facility] Provider Note   CSN: [id number] Arrival date & time: 10/27/23  1746     Patient presents with: Chest Pain   Felicia Rivera is a 53 y.o. female with past medical history of bipolar disorder, fibromyalgia, migraine, IBS, GERD presents to emergency department for evaluation of left-sided sharp chest pain that lasted seconds following waking up this morning.  No associated shortness of breath.  No chest pain since incident this morning.  Also complains of decreased appetite over past week.  She has been eating very small meals and Gatorade for assistance.  Denies nausea, vomiting, diarrhea.  Last BM was today and normal.  No urinary symptoms.  She reports that both of her parents recently died over the past 2 years and she just recently settled all of their affairs so has more time to herself now.  She follows therapist who recently set up appointment with a grief counselor.  She endorses a 4 pound weight loss over the past 4 years.  She normally weighs 98-105 pounds.  Denies SI, HI, self-injury.     Chest Pain      Prior to Admission medications   Medication Sig Start Date End Date Taking? Authorizing Provider  acetaminophen  (TYLENOL ) 500 MG tablet Take 500 mg by mouth every 6 (six) hours as needed.    [provider]  albuterol (PROVENTIL HFA;VENTOLIN HFA) 108 (90 Base) MCG/ACT inhaler Inhale into the lungs every 6 (six) hours as needed for wheezing or shortness of breath.    [provider]  baclofen  (LIORESAL ) 10 MG tablet TAKE 1 TABLET BY MOUTH AT BEDTIME AS NEEDED FOR MUSCLE SPASMS. 05/29/21   Cheryl Waddell HERO, PA-C  buPROPion (WELLBUTRIN XL) 150 MG 24 hr tablet Take 150 mg by mouth daily.    [provider]  buPROPion (WELLBUTRIN XL) 300 MG 24 hr tablet Take 150 mg by mouth.  Patient not taking: Reported on 04/04/2023 12/09/16   [provider]  Calcium Carbonate Antacid (TUMS PO) Take by  mouth as needed.    [provider]  cetirizine (ZYRTEC) 10 MG tablet Take 10 mg by mouth daily.    [provider]  clonazePAM (KLONOPIN) 0.5 MG tablet Take 1 mg by mouth 2 (two) times daily. 09/19/23   [provider]  clonazePAM (KLONOPIN) 1 MG tablet 2 (two) times daily. Patient not taking: Reported on 10/17/2023 05/15/19   [provider]  DiphenhydrAMINE HCl (BENADRYL PO) Take by mouth as needed.    [provider]  fluticasone (FLONASE) 50 MCG/ACT nasal spray USE 2 SPRAYS IN EACH NOSTRIL ONCE A DAY DURING SEASONS OF DIFFICULTY 90    [provider]  fluticasone-salmeterol (ADVAIR) 250-50 MCG/ACT AEPB 1 puff Patient not taking: Reported on 04/04/2023 10/17/20   [provider]  fluticasone-salmeterol (WIXELA INHUB) 250-50 MCG/ACT AEPB Inhale 1 puff into the lungs in the morning and at bedtime.    [provider]  ipratropium (ATROVENT) 0.03 % nasal spray daily.    [provider]  lamoTRIgine (LAMICTAL) 200 MG tablet Take 200 mg by mouth daily.    [provider]  loperamide (IMODIUM) 2 MG capsule Take by mouth as needed for diarrhea or loose stools.    [provider]  Menthol, Topical Analgesic, (BIOFREEZE EX) Apply topically as needed.    [provider]  Multiple Vitamins-Minerals (EMERGEN-C IMMUNE PO) Take by mouth. Patient not taking: Reported on 10/17/2023    [provider]  Nerve Stimulator (PRO COMFORT TENS UNIT) DEVI by Does not apply route as needed.     [provider]  omeprazole (PRILOSEC) 20 MG capsule Take 20 mg by mouth every morning. 01/09/21   [provider]  ondansetron  (ZOFRAN -ODT) 4 MG disintegrating tablet Take 1 tablet (4 mg total) by mouth every 8 (eight) hours as needed for nausea or vomiting. 12/22/21   Ethyl Richerd BROCKS, MD  QUEtiapine (SEROQUEL) 300 MG tablet Take 300 mg by mouth at bedtime.    [provider]  Simethicone   (GAS-X PO) Take by mouth as needed.     [provider]  sodium fluoride (PREVIDENT 5000 PLUS) 1.1 % CREA dental cream Denta 5000 Plus 1.1 % cream  USE TWICE A DAY, DO NOT EAT, DRINK, OR RINSE FOR 30 MINUTES AFTER USE Patient not taking: Reported on 08/02/2021    [provider]  topiramate  (TOPAMAX ) 25 MG tablet Take 2 tablets (50 mg total) by mouth at bedtime. 10/17/23   Cheryl Waddell HERO, PA-C  triamcinolone ointment (KENALOG) 0.1 % triamcinolone acetonide 0.1 % topical ointment  APPLY THIN COAT TO AFFECTED AREA TWICE A DAY Patient not taking: Reported on 03/28/2022    [provider]    Allergies: Benztropine , Cefdinir, Clarithromycin, Erythromycin, Hydrocodone, Nsaids, and Pregabalin    Review of Systems  Cardiovascular:  Positive for chest pain.    Updated Vital Signs BP 101/72 (BP Location: Left Arm)   Pulse 78   Temp 98.6 F (37 C) (Oral)   Resp 18   Ht 5' 4 (1.626 m)   Wt 40.8 kg   SpO2 98%   BMI 15.45 kg/m   Physical Exam Vitals and nursing note reviewed.  Constitutional:      General: She is not in acute distress.    Appearance: Normal appearance.  HENT:     Head: Normocephalic and atraumatic.   Eyes:     Conjunctiva/sclera: Conjunctivae normal.    Cardiovascular:     Rate and Rhythm: Normal rate.     Heart sounds: Normal heart sounds. No murmur heard. Pulmonary:     Effort: Pulmonary effort is normal. No respiratory distress.     Breath sounds: Normal breath sounds.   Musculoskeletal:     Right lower leg: No edema.     Left lower leg: No edema.     Comments: No pedal edema nor TTP.  [place]' sign negative x 2   Skin:    Coloration: Skin is not jaundiced or pale.   Neurological:     Mental Status: She is alert and oriented to person, place, and time. Mental status is at baseline.   Psychiatric:        Attention and Perception: Attention normal. She does not perceive auditory or visual hallucinations.        Mood and Affect:  Affect is tearful.        Speech: Speech normal.        Behavior: Behavior normal. Behavior is cooperative.        Thought Content: Thought content is not paranoid or delusional. Thought content does not include homicidal or suicidal ideation. Thought content does not include homicidal or suicidal plan.     (all labs ordered are listed, but only abnormal results are displayed) Labs Reviewed  BASIC METABOLIC PANEL WITH GFR - Abnormal; Notable for the following components:      Result Value   Potassium 3.3 (*)    Glucose, Bld 119 (*)  Creatinine, Ser 1.28 (*)    GFR, Estimated 50 (*)    All other components within normal limits  CBC  TROPONIN I (HIGH SENSITIVITY)  TROPONIN I (HIGH SENSITIVITY)    EKG: None  Radiology: DG Chest 2 View Result Date: 10/27/2023 CLINICAL DATA:  CP EXAM: CHEST - 2 VIEW COMPARISON:  June 19, 2023 FINDINGS: Hyperinflated lungs. No focal airspace consolidation, pleural effusion, or pneumothorax. No cardiomegaly.No acute fracture or destructive lesion. IMPRESSION: No acute cardiopulmonary abnormality. Electronically Signed   By: Rogelia Myers M.D.   On: 10/27/2023 18:28    Medications Ordered in the ED - No data to display  Clinical Course as of 10/27/23 2312  Sun Oct 27, 2023  2312 Creatinine(!): 1.28 Baseline 0.99-1.42 over past 7 years [LB]    Clinical Course User Index [LB] Minnie Tinnie BRAVO, PA                                 Medical Decision Making Amount and/or Complexity of Data Reviewed Labs: ordered. Decision-making details documented in ED Course. Radiology: ordered.   Patient presents to the ED for concern of chest pain, decreased appetite, this involves an extensive number of treatment options, and is a complaint that carries with it a high risk of complications and morbidity.  The differential diagnosis includes ACS, pneumonia, PE, electrolyte abnormality, infection   Co morbidities that complicate the patient  evaluation  See HPI   Additional history obtained:  Additional history obtained from Nursing   External records from outside source obtained and reviewed including triage note   Lab Tests:  I Ordered, and personally interpreted labs.  The pertinent results include:   Potassium 3.3 CBG 119 Creatinine 1.28 Troponin negative x 2   Imaging Studies ordered:  I ordered imaging studies including chest x-ray I independently visualized and interpreted imaging which showed no acute intrathoracic abnormalities I agree with the radiologist interpretation   Cardiac Monitoring:  The patient was maintained on a cardiac monitor.  I personally viewed and interpreted the cardiac monitored which showed an underlying rhythm of: NSR at 74 bpm    Problem List / ED Course:  CP Atypical nature.  Nonexertional.  No associated shortness of breath.  Resolved at arrival to emergency department Reassuring workup.  Troponin negative x 2.  Chest x-ray negative for pneumonia, fluid.  No signs of fluid overload. Low suspicion for PE as she has no shortness of breath.  Although she was mildly tachycardic at arrival it was likely secondary to, grief as she was actively crying.  She has not had any tachycardia since then.  Maintaining oxygen saturation without supplementation.  Speaking full complete sentences without difficulty.  Lung sounds CTAB.  Decreased appetite Likely secondary to grief.  Fortunately, has been drinking Gatorade and maintain oral hydration.  Does not appear dry on exam. Denies NVD.  Is able to keep food down. No significant weight loss acutely No abdominal pain   Reevaluation:  After the interventions noted above, I reevaluated the patient and found that they have :stayed the same   Social Determinants of Health:  Has PCP, therapist   Dispostion:  After consideration of the diagnostic results and the patients response to treatment, I feel that the patent would benefit  from outpatient management with specialized care.  Discussed ED workup, disposition, return to ED precautions with patient who expresses understanding agrees with plan.  All questions answered to their  satisfaction.  They are agreeable to plan.  Discharge instructions provided on paperwork  Final diagnoses:  Atypical chest pain  Decreased appetite    ED Discharge Orders     None          Minnie Tinnie BRAVO, PA 10/27/23 2323    Albertina Dixon, MD 10/28/23 [phone]

## 2023-10-27 NOTE — Discharge Instructions (Signed)
 Thank for let us  evaluate you today.  Your lab work was unremarkable.  Your troponins/heart enzymes were negative for heart injury.  Your chest x-ray did not show any fluid no pneumonia.  Please follow-up with grief counselor, weight counselor for further management  Return to Emergency Department if you experience chest pain, shortness of breath, suicidal ideation, worsening symptoms

## 2023-10-27 NOTE — ED Triage Notes (Signed)
 Pt reports several life stressors, loss of parents and multiple other things going on.  Has lost a ton of weight  Pt states she is followed by psychiatry and has been referred to a grief counselor.  Tearful at time of triage.  Pt reports not eating well.  Good oral fluid intake however.  Pt has sharp chest pain she would like checked out.

## 2023-10-28 ENCOUNTER — Inpatient Hospital Stay (HOSPITAL_COMMUNITY)
Admission: EM | Admit: 2023-10-28 | Discharge: 2023-11-01 | DRG: 659 | Disposition: A | Discharge status: Home / Self Care | Attending: Family Medicine | Admitting: Family Medicine

## 2023-10-28 ENCOUNTER — Encounter (HOSPITAL_COMMUNITY): Payer: Self-pay

## 2023-10-28 ENCOUNTER — Emergency Department (HOSPITAL_COMMUNITY)

## 2023-10-28 ENCOUNTER — Ambulatory Visit: Payer: Self-pay

## 2023-10-28 DIAGNOSIS — F319 Bipolar disorder, unspecified: Secondary | ICD-10-CM | POA: Diagnosis present

## 2023-10-28 DIAGNOSIS — Z8249 Family history of ischemic heart disease and other diseases of the circulatory system: Secondary | ICD-10-CM

## 2023-10-28 DIAGNOSIS — Z83438 Family history of other disorder of lipoprotein metabolism and other lipidemia: Secondary | ICD-10-CM

## 2023-10-28 DIAGNOSIS — N390 Urinary tract infection, site not specified: Secondary | ICD-10-CM

## 2023-10-28 DIAGNOSIS — N138 Other obstructive and reflux uropathy: Secondary | ICD-10-CM | POA: Diagnosis present

## 2023-10-28 DIAGNOSIS — Z841 Family history of disorders of kidney and ureter: Secondary | ICD-10-CM

## 2023-10-28 DIAGNOSIS — Z881 Allergy status to other antibiotic agents status: Secondary | ICD-10-CM | POA: Diagnosis not present

## 2023-10-28 DIAGNOSIS — G2581 Restless legs syndrome: Secondary | ICD-10-CM | POA: Diagnosis present

## 2023-10-28 DIAGNOSIS — Z833 Family history of diabetes mellitus: Secondary | ICD-10-CM

## 2023-10-28 DIAGNOSIS — M797 Fibromyalgia: Secondary | ICD-10-CM | POA: Diagnosis present

## 2023-10-28 DIAGNOSIS — Z806 Family history of leukemia: Secondary | ICD-10-CM

## 2023-10-28 DIAGNOSIS — J45909 Unspecified asthma, uncomplicated: Secondary | ICD-10-CM | POA: Diagnosis present

## 2023-10-28 DIAGNOSIS — K219 Gastro-esophageal reflux disease without esophagitis: Secondary | ICD-10-CM | POA: Diagnosis present

## 2023-10-28 DIAGNOSIS — G43909 Migraine, unspecified, not intractable, without status migrainosus: Secondary | ICD-10-CM | POA: Diagnosis present

## 2023-10-28 DIAGNOSIS — Z888 Allergy status to other drugs, medicaments and biological substances status: Secondary | ICD-10-CM | POA: Diagnosis not present

## 2023-10-28 DIAGNOSIS — K589 Irritable bowel syndrome without diarrhea: Secondary | ICD-10-CM | POA: Diagnosis present

## 2023-10-28 DIAGNOSIS — R001 Bradycardia, unspecified: Secondary | ICD-10-CM | POA: Diagnosis not present

## 2023-10-28 DIAGNOSIS — N3000 Acute cystitis without hematuria: Principal | ICD-10-CM | POA: Diagnosis present

## 2023-10-28 DIAGNOSIS — F419 Anxiety disorder, unspecified: Secondary | ICD-10-CM | POA: Diagnosis present

## 2023-10-28 DIAGNOSIS — E43 Unspecified severe protein-calorie malnutrition: Secondary | ICD-10-CM | POA: Diagnosis present

## 2023-10-28 DIAGNOSIS — N179 Acute kidney failure, unspecified: Secondary | ICD-10-CM | POA: Diagnosis present

## 2023-10-28 DIAGNOSIS — N202 Calculus of kidney with calculus of ureter: Secondary | ICD-10-CM | POA: Diagnosis present

## 2023-10-28 DIAGNOSIS — Z886 Allergy status to analgesic agent status: Secondary | ICD-10-CM

## 2023-10-28 DIAGNOSIS — E860 Dehydration: Secondary | ICD-10-CM | POA: Diagnosis present

## 2023-10-28 DIAGNOSIS — Z66 Do not resuscitate: Secondary | ICD-10-CM | POA: Diagnosis present

## 2023-10-28 DIAGNOSIS — Z7951 Long term (current) use of inhaled steroids: Secondary | ICD-10-CM

## 2023-10-28 DIAGNOSIS — N132 Hydronephrosis with renal and ureteral calculous obstruction: Secondary | ICD-10-CM | POA: Diagnosis not present

## 2023-10-28 DIAGNOSIS — K5900 Constipation, unspecified: Secondary | ICD-10-CM | POA: Diagnosis present

## 2023-10-28 DIAGNOSIS — Z79899 Other long term (current) drug therapy: Secondary | ICD-10-CM | POA: Diagnosis not present

## 2023-10-28 DIAGNOSIS — G4700 Insomnia, unspecified: Secondary | ICD-10-CM | POA: Diagnosis present

## 2023-10-28 DIAGNOSIS — Z885 Allergy status to narcotic agent status: Secondary | ICD-10-CM

## 2023-10-28 DIAGNOSIS — N201 Calculus of ureter: Principal | ICD-10-CM | POA: Diagnosis present

## 2023-10-28 DIAGNOSIS — Z825 Family history of asthma and other chronic lower respiratory diseases: Secondary | ICD-10-CM

## 2023-10-28 DIAGNOSIS — R319 Hematuria, unspecified: Secondary | ICD-10-CM

## 2023-10-28 DIAGNOSIS — Z681 Body mass index (BMI) 19 or less, adult: Secondary | ICD-10-CM | POA: Diagnosis not present

## 2023-10-28 DIAGNOSIS — Z8261 Family history of arthritis: Secondary | ICD-10-CM

## 2023-10-28 DIAGNOSIS — Z83511 Family history of glaucoma: Secondary | ICD-10-CM

## 2023-10-28 LAB — CBC WITH DIFFERENTIAL/PLATELET
Abs Immature Granulocytes: 0.05 10*3/uL (ref 0.00–0.07)
Basophils Absolute: 0.1 10*3/uL (ref 0.0–0.1)
Basophils Relative: 1 %
Eosinophils Absolute: 0 10*3/uL (ref 0.0–0.5)
Eosinophils Relative: 0 %
HCT: 44.3 % (ref 36.0–46.0)
Hemoglobin: 14.8 g/dL (ref 12.0–15.0)
Immature Granulocytes: 0 %
Lymphocytes Relative: 7 %
Lymphs Abs: 0.9 10*3/uL (ref 0.7–4.0)
MCH: 32.1 pg (ref 26.0–34.0)
MCHC: 33.4 g/dL (ref 30.0–36.0)
MCV: 96.1 fL (ref 80.0–100.0)
Monocytes Absolute: 0.5 10*3/uL (ref 0.1–1.0)
Monocytes Relative: 4 %
Neutro Abs: 11.3 10*3/uL — ABNORMAL HIGH (ref 1.7–7.7)
Neutrophils Relative %: 88 %
Platelets: 207 10*3/uL (ref 150–400)
RBC: 4.61 MIL/uL (ref 3.87–5.11)
RDW: 12.1 % (ref 11.5–15.5)
WBC: 12.8 10*3/uL — ABNORMAL HIGH (ref 4.0–10.5)
nRBC: 0 % (ref 0.0–0.2)

## 2023-10-28 LAB — URINALYSIS, ROUTINE W REFLEX MICROSCOPIC
Glucose, UA: 100 mg/dL — AB
Ketones, ur: >80 mg/dL — AB
Nitrite: POSITIVE — AB
Protein, ur: 100 mg/dL — AB
Specific Gravity, Urine: 1.02 (ref 1.005–1.030)
pH: 7 (ref 5.0–8.0)

## 2023-10-28 LAB — URINALYSIS, MICROSCOPIC (REFLEX): WBC, UA: >50 WBC/hpf (ref 0–5)

## 2023-10-28 LAB — COMPREHENSIVE METABOLIC PANEL WITH GFR
ALT: 9 U/L (ref 0–44)
AST: 25 U/L (ref 15–41)
Albumin: 4.2 g/dL (ref 3.5–5.0)
Alkaline Phosphatase: 50 U/L (ref 38–126)
Anion gap: 10 (ref 5–15)
BUN: 18 mg/dL (ref 6–20)
CO2: 25 mmol/L (ref 22–32)
Calcium: 10.2 mg/dL (ref 8.9–10.3)
Chloride: 105 mmol/L (ref 98–111)
Creatinine, Ser: 1.36 mg/dL — ABNORMAL HIGH (ref 0.44–1.00)
GFR, Estimated: 47 mL/min — ABNORMAL LOW (ref >60)
Glucose, Bld: 151 mg/dL — ABNORMAL HIGH (ref 70–99)
Potassium: 4.6 mmol/L (ref 3.5–5.1)
Sodium: 140 mmol/L (ref 135–145)
Total Bilirubin: 1.1 mg/dL (ref 0.0–1.2)
Total Protein: 7.5 g/dL (ref 6.5–8.1)

## 2023-10-28 LAB — MAGNESIUM: Magnesium: 2.3 mg/dL (ref 1.7–2.4)

## 2023-10-28 MED ORDER — FAMOTIDINE IN NACL 20-0.9 MG/50ML-% IV SOLN
20.0000 mg | Freq: Once | INTRAVENOUS | Status: AC
Start: 2023-10-28 — End: 2023-10-28
  Administered 2023-10-28: 20 mg via INTRAVENOUS
  Filled 2023-10-28: qty 50

## 2023-10-28 MED ORDER — ACETAMINOPHEN 650 MG RE SUPP: 650.0000 mg | Freq: Four times a day (QID) | RECTAL | Status: DC | PRN

## 2023-10-28 MED ORDER — ACETAMINOPHEN 325 MG PO TABS
650.0000 mg | ORAL_TABLET | Freq: Four times a day (QID) | ORAL | Status: DC | PRN
  Administered 2023-10-30: 650 mg via ORAL
  Filled 2023-10-28: qty 2

## 2023-10-28 MED ORDER — KETOROLAC TROMETHAMINE 30 MG/ML IJ SOLN
30.0000 mg | Freq: Once | INTRAMUSCULAR | Status: AC
  Administered 2023-10-28: 30 mg via INTRAVENOUS
  Filled 2023-10-28: qty 1

## 2023-10-28 MED ORDER — LAMOTRIGINE 100 MG PO TABS
200.0000 mg | ORAL_TABLET | Freq: Once | ORAL | Status: AC
  Administered 2023-10-28: 200 mg via ORAL
  Filled 2023-10-28: qty 2

## 2023-10-28 MED ORDER — SODIUM CHLORIDE 0.9 % IV SOLN
1.0000 g | Freq: Once | INTRAVENOUS | Status: AC
  Administered 2023-10-28: 1 g via INTRAVENOUS
  Filled 2023-10-28: qty 10

## 2023-10-28 MED ORDER — TAMSULOSIN HCL 0.4 MG PO CAPS
0.4000 mg | ORAL_CAPSULE | Freq: Every day | ORAL | Status: DC
  Administered 2023-10-29 – 2023-11-01 (×3): 0.4 mg via ORAL
  Filled 2023-10-28 (×4): qty 1

## 2023-10-28 MED ORDER — ENOXAPARIN SODIUM 30 MG/0.3ML IJ SOSY
30.0000 mg | PREFILLED_SYRINGE | INTRAMUSCULAR | Status: DC
  Administered 2023-10-31 – 2023-11-01 (×2): 30 mg via SUBCUTANEOUS
  Filled 2023-10-28 (×3): qty 0.3

## 2023-10-28 MED ORDER — THIAMINE HCL 100 MG/ML IJ SOLN
100.0000 mg | Freq: Once | INTRAMUSCULAR | Status: AC
  Administered 2023-10-28: 100 mg via INTRAVENOUS
  Filled 2023-10-28: qty 2

## 2023-10-28 MED ORDER — QUETIAPINE FUMARATE 50 MG PO TABS
300.0000 mg | ORAL_TABLET | Freq: Every day | ORAL | Status: DC
  Administered 2023-10-28 – 2023-10-31 (×4): 300 mg via ORAL
  Filled 2023-10-28: qty 6
  Filled 2023-10-28: qty 3
  Filled 2023-10-28 (×2): qty 6

## 2023-10-28 MED ORDER — SENNOSIDES-DOCUSATE SODIUM 8.6-50 MG PO TABS: 1.0000 | ORAL_TABLET | Freq: Every evening | ORAL | Status: DC | PRN

## 2023-10-28 MED ORDER — TOPIRAMATE 25 MG PO TABS
50.0000 mg | ORAL_TABLET | Freq: Once | ORAL | Status: AC
Start: 2023-10-28 — End: 2023-10-28
  Administered 2023-10-28: 50 mg via ORAL
  Filled 2023-10-28: qty 2

## 2023-10-28 MED ORDER — SODIUM CHLORIDE 0.9 % IV BOLUS
1000.0000 mL | Freq: Once | INTRAVENOUS | Status: AC
  Administered 2023-10-28: 1000 mL via INTRAVENOUS

## 2023-10-28 MED ORDER — HYDROMORPHONE HCL 1 MG/ML IJ SOLN
0.5000 mg | Freq: Four times a day (QID) | INTRAMUSCULAR | Status: DC | PRN
  Administered 2023-10-28 – 2023-10-30 (×4): 0.5 mg via INTRAVENOUS
  Filled 2023-10-28 (×4): qty 0.5
  Filled 2023-10-28: qty 1

## 2023-10-28 MED ORDER — FLUCONAZOLE 150 MG PO TABS
150.0000 mg | ORAL_TABLET | Freq: Once | ORAL | Status: AC
  Administered 2023-10-28: 150 mg via ORAL
  Filled 2023-10-28: qty 1

## 2023-10-28 MED ORDER — ONDANSETRON HCL 4 MG PO TABS: 4.0000 mg | ORAL_TABLET | Freq: Four times a day (QID) | ORAL | Status: DC | PRN

## 2023-10-28 MED ORDER — ONDANSETRON HCL 4 MG/2ML IJ SOLN
4.0000 mg | Freq: Once | INTRAMUSCULAR | Status: AC
  Administered 2023-10-28: 4 mg via INTRAVENOUS
  Filled 2023-10-28: qty 2

## 2023-10-28 MED ORDER — CLONAZEPAM 0.5 MG PO TABS
1.0000 mg | ORAL_TABLET | Freq: Once | ORAL | Status: AC
  Administered 2023-10-28: 1 mg via ORAL
  Filled 2023-10-28: qty 2

## 2023-10-28 MED ORDER — ONDANSETRON HCL 4 MG/2ML IJ SOLN: 4.0000 mg | Freq: Four times a day (QID) | INTRAMUSCULAR | Status: DC | PRN

## 2023-10-28 NOTE — ED Provider Notes (Signed)
 [place] EMERGENCY DEPARTMENT AT [facility] Provider Note   CSN: [id number] Arrival date & time: 10/28/23  1642     Patient presents with: Pelvic Pain   Felicia Rivera is a 53 y.o. female past medical history of bipolar disorder, fibromyalgia, migraine, IBS, GERD who was seen in the emergency department yesterday for chest pain and had a negative workup.  Patient returns today stating that she has pain in her back rotating around the front of her abdomen and into her pelvis.  She states this is definitely a muscle spasm.  She states this hurts worse when she moves.  Patient denies any urinary symptoms.  She states that when she got home yesterday she was so tired she passed out think she slept wrong.  Patient states I think is just because I am dehydrated    Pelvic Pain       Prior to Admission medications   Medication Sig Start Date End Date Taking? Authorizing Provider  acetaminophen  (TYLENOL ) 500 MG tablet Take 500 mg by mouth every 6 (six) hours as needed.    [provider]  albuterol (PROVENTIL HFA;VENTOLIN HFA) 108 (90 Base) MCG/ACT inhaler Inhale into the lungs every 6 (six) hours as needed for wheezing or shortness of breath.    [provider]  baclofen  (LIORESAL ) 10 MG tablet TAKE 1 TABLET BY MOUTH AT BEDTIME AS NEEDED FOR MUSCLE SPASMS. 05/29/21   Cheryl Waddell HERO, PA-C  buPROPion (WELLBUTRIN XL) 150 MG 24 hr tablet Take 150 mg by mouth daily.    [provider]  buPROPion (WELLBUTRIN XL) 300 MG 24 hr tablet Take 150 mg by mouth.  Patient not taking: Reported on 04/04/2023 12/09/16   [provider]  Calcium Carbonate Antacid (TUMS PO) Take by mouth as needed.    [provider]  cetirizine (ZYRTEC) 10 MG tablet Take 10 mg by mouth daily.    [provider]  clonazePAM (KLONOPIN) 0.5 MG tablet Take 1 mg by mouth 2 (two) times daily. 09/19/23   [provider]  clonazePAM (KLONOPIN) 1 MG tablet 2  (two) times daily. Patient not taking: Reported on 10/17/2023 05/15/19   [provider]  DiphenhydrAMINE HCl (BENADRYL PO) Take by mouth as needed.    [provider]  fluticasone (FLONASE) 50 MCG/ACT nasal spray USE 2 SPRAYS IN EACH NOSTRIL ONCE A DAY DURING SEASONS OF DIFFICULTY 90    [provider]  fluticasone-salmeterol (ADVAIR) 250-50 MCG/ACT AEPB 1 puff Patient not taking: Reported on 04/04/2023 10/17/20   [provider]  fluticasone-salmeterol (WIXELA INHUB) 250-50 MCG/ACT AEPB Inhale 1 puff into the lungs in the morning and at bedtime.    [provider]  ipratropium (ATROVENT) 0.03 % nasal spray daily.    [provider]  lamoTRIgine (LAMICTAL) 200 MG tablet Take 200 mg by mouth daily.    [provider]  loperamide (IMODIUM) 2 MG capsule Take by mouth as needed for diarrhea or loose stools.    [provider]  Menthol, Topical Analgesic, (BIOFREEZE EX) Apply topically as needed.    [provider]  Multiple Vitamins-Minerals (EMERGEN-C IMMUNE PO) Take by mouth. Patient not taking: Reported on 10/17/2023    [provider]  Nerve Stimulator (PRO COMFORT TENS UNIT) DEVI by Does not apply route as needed.     [provider]  omeprazole (PRILOSEC) 20 MG capsule Take 20 mg by mouth every morning. 01/09/21   [provider]  ondansetron  (ZOFRAN -ODT)  4 MG disintegrating tablet Take 1 tablet (4 mg total) by mouth every 8 (eight) hours as needed for nausea or vomiting. 12/22/21   Branham, Victoria C, MD  QUEtiapine (SEROQUEL) 300 MG tablet Take 300 mg by mouth at bedtime.    [provider]  Simethicone  (GAS-X PO) Take by mouth as needed.     [provider]  sodium fluoride (PREVIDENT 5000 PLUS) 1.1 % CREA dental cream Denta 5000 Plus 1.1 % cream  USE TWICE A DAY, DO NOT EAT, DRINK, OR RINSE FOR 30 MINUTES AFTER USE Patient not taking: Reported on 08/02/2021    [provider]  topiramate  (TOPAMAX ) 25 MG tablet Take 2 tablets (50 mg total) by mouth at bedtime. 10/17/23   Cheryl Waddell HERO, PA-C  triamcinolone ointment (KENALOG) 0.1 % triamcinolone acetonide 0.1 % topical ointment  APPLY THIN COAT TO AFFECTED AREA TWICE A DAY Patient not taking: Reported on 03/28/2022    [provider]    Allergies: Benztropine , Cefdinir, Clarithromycin, Erythromycin, Hydrocodone, Nsaids, and Pregabalin    Review of Systems  Genitourinary:  Positive for pelvic pain.    Updated Vital Signs BP 118/78 (BP Location: Right Arm)   Pulse 80   Temp 97.7 F (36.5 C) (Oral)   Resp 18   Wt 40.2 kg   SpO2 100%   BMI 15.21 kg/m   Physical Exam Vitals and nursing note reviewed.  Constitutional:      General: She is not in acute distress.    Appearance: She is well-developed and underweight. She is not diaphoretic.  HENT:     Head: Normocephalic and atraumatic.     Right Ear: External ear normal.     Left Ear: External ear normal.     Nose: Nose normal.     Mouth/Throat:     Mouth: Mucous membranes are moist.   Eyes:     General: No scleral icterus.    Conjunctiva/sclera: Conjunctivae normal.    Cardiovascular:     Rate and Rhythm: Normal rate and regular rhythm.     Heart sounds: Normal heart sounds. No murmur heard.    No friction rub. No gallop.  Pulmonary:     Effort: Pulmonary effort is normal. No respiratory distress.     Breath sounds: Normal breath sounds.  Abdominal:     General: Bowel sounds are normal. There is no distension.     Palpations: Abdomen is soft. There is no mass.     Tenderness: There is no abdominal tenderness. There is no guarding.   Musculoskeletal:     Cervical back: Normal range of motion.   Skin:    General: Skin is warm and dry.   Neurological:     Mental Status: She is alert and oriented to person, place, and time.   Psychiatric:        Behavior: Behavior normal.     (all labs ordered are listed, but  only abnormal results are displayed) Labs Reviewed  CBC WITH DIFFERENTIAL/PLATELET - Abnormal; Notable for the following components:      Result Value   WBC 12.8 (*)    Neutro Abs 11.3 (*)    All other components within normal limits  COMPREHENSIVE METABOLIC PANEL WITH GFR - Abnormal; Notable for the following components:   Glucose, Bld 151 (*)    Creatinine, Ser 1.36 (*)    GFR, Estimated 47 (*)    All other components within normal limits  URINALYSIS, ROUTINE W REFLEX MICROSCOPIC  EKG: None  Radiology: DG Chest 2 View Result Date: 10/27/2023 CLINICAL DATA:  CP EXAM: CHEST - 2 VIEW COMPARISON:  June 19, 2023 FINDINGS: Hyperinflated lungs. No focal airspace consolidation, pleural effusion, or pneumothorax. No cardiomegaly.No acute fracture or destructive lesion. IMPRESSION: No acute cardiopulmonary abnormality. Electronically Signed   By: Rogelia Myers M.D.   On: 10/27/2023 18:28     Procedures   Medications Ordered in the ED - No data to display  Clinical Course as of 10/28/23 2246  Mon Oct 28, 2023  2118 Urinalysis, Routine w reflex microscopic -Urine, Clean Catch(!) [AH]  2118 CBC with Differential(!) [AH]  2118 Comprehensive metabolic panel(!) [AH]  2118 Urinalysis, Microscopic (reflex)(!) [AH]  2202 CT Renal Stone Study Visualized and interpreted his CT renal stone study which shows a 4 mm nonobstructing show stone in the right UVJ.  Patient also notably has a urinary tract infection.  I placed a call to on-call urology.  I have ordered IV Rocephin. [AH]  2203 Case discussed with Dr. Hersh Trivedi who recommends 2 g IV Rocephin, admission [AH]    Clinical Course User Index [AH] Arloa Chroman, PA-C                                 Medical Decision Making Amount and/or Complexity of Data Reviewed Labs: ordered. Decision-making details documented in ED Course. Radiology: ordered. Decision-making details documented in ED Course.  Risk Prescription drug  management. Decision regarding hospitalization.   This patient presents to the ED for concern of flank pain , this involves an extensive number of treatment options, and is a complaint that carries with it a high risk of complications and morbidity.  The differential diagnosis of emergent flank pain includes, but is not limited to :Abdominal aortic aneurysm,, Renal artery embolism,Renal vein thrombosis, Aortic dissection, Mesenteric ischemia, Pyelonephritis, Renal infarction, Renal hemorrhage, Nephrolithiasis/ Renal Colic, Bladder tumor,Cystitis, Biliary colic, Pancreatitis Perforated peptic ulcer Appendicitis ,Inguinal Hernia, Diverticulitis, Bowel obstruction Shingles, Lower lobe pneumonia, Retroperitoneal hematoma/abscess/tumor, Epidural abscess, Epidural hematoma   Co morbidities:   has a past medical history of Arthritis, Bipolar 1 disorder (HCC) (02/28/2016), Bipolar disorder (HCC) (02/28/2016), Bursitis, Fibromyalgia (02/28/2016), GERD (gastroesophageal reflux disease) (02/28/2016), IBS (irritable bowel syndrome) (02/28/2016), Migraine (02/28/2016), Periodic limb movement disorder (PLMD), RLS (restless legs syndrome) (02/28/2016), Ulnar neuropathy at elbow, left (02/28/2016), and Vitamin D  deficiency (02/28/2016).   Social Determinants of Health:   SDOH Screenings   Tobacco Use: Low Risk  (10/28/2023)     Additional history:  {Additional history obtained from patient and emr  Lab Tests:  I Ordered, and personally interpreted labs.  The pertinent results include:    As per ED course Imaging Studies:  As per ED course  Medicines ordered and prescription drug management:  I ordered medication including  Medications  tamsulosin (FLOMAX) capsule 0.4 mg (has no administration in time range)  cefTRIAXone (ROCEPHIN) 1 g in sodium chloride  0.9 % 100 mL IVPB (1 g Intravenous New Bag/Given 10/28/23 2239)  QUEtiapine (SEROQUEL) tablet 300 mg (300 mg Oral Given 10/28/23 2239)   sodium chloride  0.9 % bolus 1,000 mL (0 mLs Intravenous Stopped 10/28/23 2206)  thiamine (VITAMIN B1) injection 100 mg (100 mg Intravenous Given 10/28/23 2106)  ketorolac (TORADOL) 30 MG/ML injection 30 mg (30 mg Intravenous Given 10/28/23 2107)  famotidine  (PEPCID ) IVPB 20 mg premix (0 mg Intravenous Stopped 10/28/23 2206)  ondansetron  (ZOFRAN ) injection 4 mg (4 mg Intravenous Given  10/28/23 2111)  cefTRIAXone (ROCEPHIN) 1 g in sodium chloride  0.9 % 100 mL IVPB (0 g Intravenous Stopped 10/28/23 2239)  clonazePAM (KLONOPIN) tablet 1 mg (1 mg Oral Given 10/28/23 2239)  lamoTRIgine (LAMICTAL) tablet 200 mg (200 mg Oral Given 10/28/23 2239)  topiramate  (TOPAMAX ) tablet 50 mg (50 mg Oral Given 10/28/23 2239)  fluconazole  (DIFLUCAN ) tablet 150 mg (150 mg Oral Given 10/28/23 2239)   for infected stone and pain, nightly meds and per patient request to prevent vaginal yeast infection Reevaluation of the patient after these medicines showed that the patient improved I have reviewed the patients home medicines and have made adjustments as needed  Test Considered:    Critical Interventions:    Consultations Obtained: As per ED course  Problem List / ED Course:     ICD-10-CM   1. Right ureteral stone  N20.1     2. Urinary tract infection with hematuria, site unspecified  N39.0    R31.9       MDM: Patient here with flank pain appears to have a right sided infected stone at the UVJ.  Admit overnight for observation per urology request.  Appropriate for inpatient observation.   Dispostion:  After consideration of the diagnostic results and the patients response to treatment, I feel that the patent would benefit from admission.      Final diagnoses:  None    ED Discharge Orders     None          Arloa Chroman, PA-C 10/28/23 2257    Ula Prentice SAUNDERS, MD 10/28/23 [phone]

## 2023-10-28 NOTE — ED Notes (Signed)
 Took pt to bathroom for UA, pt could not void

## 2023-10-28 NOTE — Telephone Encounter (Addendum)
 Copied from CRM [id number]. Topic: Clinical - Red Word Triage >> Oct 28, 2023  1:02 PM Carrielelia G wrote: [facility] that prompted transfer to Nurse Triage: back pain, hip pain   FYI Only or Action Required?: FYI only for provider.  Called Nurse Triage reporting Back Pain. Symptoms began today. Interventions attempted: Nothing. Symptoms are: unchanged.  Triage Disposition: See HCP Within 4 Hours (Or PCP Triage)  Patient/caregiver understands and will follow disposition?: Yes   Patient advised to take OTC medication to help with her pain and to call her PCP's office for additional treatment. Patient verbalized understanding and agreement with this plan.    Reason for Disposition  [1] SEVERE back pain (e.g., excruciating, unable to do any normal activities) AND [2] not improved 2 hours after pain medicine  Answer Assessment - Initial Assessment Questions 1. ONSET: When did the pain begin?      2 hours ago 2. LOCATION: Where does it hurt? (upper, mid or lower back)     Lower back  3. SEVERITY: How bad is the pain?  (e.g., Scale 1-10; mild, moderate, or severe)   - MILD (1-3): Doesn't interfere with normal activities.    - MODERATE (4-7): Interferes with normal activities or awakens from sleep.    - SEVERE (8-10): Excruciating pain, unable to do any normal activities.      Moderate to severe  4. PATTERN: Is the pain constant? (e.g., yes, no; constant, intermittent)      Constant  5. RADIATION: Does the pain shoot into your legs or somewhere else?     Right hip 6. CAUSE:  What do you think is causing the back pain?      Unsure  7. BACK OVERUSE:  Any recent lifting of heavy objects, strenuous work or exercise?     No 8. MEDICINES: What have you taken so far for the pain? (e.g., nothing, acetaminophen , NSAIDS)     No 9. NEUROLOGIC SYMPTOMS: Do you have any weakness, numbness, or problems with bowel/bladder control?     No 10. OTHER SYMPTOMS: Do you have any other  symptoms? (e.g., fever, abdomen pain, burning with urination, blood in urine)       Some stomach upset from lactose  11. PREGNANCY: Is there any chance you are pregnant? When was your last menstrual period?       No  Protocols used: Back Pain-A-AH

## 2023-10-28 NOTE — H&P (Signed)
 History and Physical  Felicia Rivera FMW:[id number] DOB: 11-01-1970 DOA: 10/28/2023  PCP: Sun, Vyvyan, MD   Chief Complaint: Back pain, pelvic pain  HPI: Felicia Rivera is a 53 y.o. female with medical history significant for bipolar 1 disorder, fibromyalgia, osteoarthritis, asthma, IBS, GERD, restless leg syndrome and migraine who presented to the ED for evaluation of back pain and pelvic pain.  ED Course: Initial vitals show patient afebrile and normotensive. Initial labs significant for creatinine 1.36, glucose 151, WBC 12.8, UA shows large hemoglobinuria, significant ketonuria, moderate proteinuria, positive nitrite, moderate leuks, RBC 11-20, WBC >20 and rare bacteria. EKG from 6/29 shows sinus rhythm.  CT renal stone shows a 4 mm obstructing renal calculus within the distal right ureter, near the right UVJ and bilateral nonobstructive renal calculi. Pt received IV Toradol, IV NS 1 L bolus, IV thiamine, IV Rocephin, Klonopin, famotidine , fluconazole  and Topamax .  Urology was consulted for evaluation. [facility] was consulted for admission.   Review of Systems: Please see HPI for pertinent positives and negatives. A complete 10 system review of systems are otherwise negative.  Past Medical History:  Diagnosis Date   Arthritis    Bipolar 1 disorder (HCC) 02/28/2016   Bipolar disorder (HCC) 02/28/2016   Bursitis    Fibromyalgia 02/28/2016   GERD (gastroesophageal reflux disease) 02/28/2016   IBS (irritable bowel syndrome) 02/28/2016   Migraine 02/28/2016   Periodic limb movement disorder (PLMD)    RLS (restless legs syndrome) 02/28/2016   Ulnar neuropathy at elbow, left 02/28/2016   Vitamin D  deficiency 02/28/2016   Past Surgical History:  Procedure Laterality Date   ABDOMINAL HYSTERECTOMY     dental work  04/16/2017   x2   Social History:  reports that she has never smoked. She has been exposed to tobacco smoke. She has never used smokeless tobacco. She reports current alcohol use. She  reports that she does not use drugs.  Allergies  Allergen Reactions   Benztropine  Other (See Comments)    Dehydration and cognitive impairment    Other reaction(s): Unknown  Other Reaction(s): Not available, shaky  Cogentin    Cefdinir Nausea Only and Other (See Comments)    Other reaction(s): Unknown   Clarithromycin Other (See Comments)    Other reaction(s): Unknown   Erythromycin Nausea Only   Hydrocodone Nausea Only   Nsaids Other (See Comments)    Other reaction(s): Unknown   Pregabalin Other (See Comments)    Jittery  Other reaction(s): Unknown    Family History  Problem Relation Age of Onset   Kidney failure Mother        Stage 3   Diabetes Mother    Glaucoma Mother    Heart Problems Mother        pacemaker, defibrilator    Hypertension Mother    Congestive Heart Failure Mother    Rheum arthritis Mother        possibly   COPD Mother    Emphysema Mother    Hypertension Father    High Cholesterol Father    Heart Problems Father    Leukemia Father    Acute myelogenous leukemia Father      Prior to Admission medications   Medication Sig Start Date End Date Taking? Authorizing Provider  acetaminophen  (TYLENOL ) 500 MG tablet Take 500 mg by mouth every 6 (six) hours as needed.    [provider]  albuterol (PROVENTIL HFA;VENTOLIN HFA) 108 (90 Base) MCG/ACT inhaler Inhale into the lungs every 6 (six) hours as needed  for wheezing or shortness of breath.    [provider]  baclofen  (LIORESAL ) 10 MG tablet TAKE 1 TABLET BY MOUTH AT BEDTIME AS NEEDED FOR MUSCLE SPASMS. 05/29/21   Cheryl Waddell HERO, PA-C  buPROPion (WELLBUTRIN XL) 150 MG 24 hr tablet Take 150 mg by mouth daily.    [provider]  buPROPion (WELLBUTRIN XL) 300 MG 24 hr tablet Take 150 mg by mouth.  Patient not taking: Reported on 04/04/2023 12/09/16   [provider]  Calcium Carbonate Antacid (TUMS PO) Take by mouth as needed.    [provider]  cetirizine  (ZYRTEC) 10 MG tablet Take 10 mg by mouth daily.    [provider]  clonazePAM (KLONOPIN) 0.5 MG tablet Take 1 mg by mouth 2 (two) times daily. 09/19/23   [provider]  clonazePAM (KLONOPIN) 1 MG tablet 2 (two) times daily. Patient not taking: Reported on 10/17/2023 05/15/19   [provider]  DiphenhydrAMINE HCl (BENADRYL PO) Take by mouth as needed.    [provider]  fluticasone (FLONASE) 50 MCG/ACT nasal spray USE 2 SPRAYS IN EACH NOSTRIL ONCE A DAY DURING SEASONS OF DIFFICULTY 90    [provider]  fluticasone-salmeterol (ADVAIR) 250-50 MCG/ACT AEPB 1 puff Patient not taking: Reported on 04/04/2023 10/17/20   [provider]  fluticasone-salmeterol (WIXELA INHUB) 250-50 MCG/ACT AEPB Inhale 1 puff into the lungs in the morning and at bedtime.    [provider]  ipratropium (ATROVENT) 0.03 % nasal spray daily.    [provider]  lamoTRIgine (LAMICTAL) 200 MG tablet Take 200 mg by mouth daily.    [provider]  loperamide (IMODIUM) 2 MG capsule Take by mouth as needed for diarrhea or loose stools.    [provider]  Menthol, Topical Analgesic, (BIOFREEZE EX) Apply topically as needed.    [provider]  Multiple Vitamins-Minerals (EMERGEN-C IMMUNE PO) Take by mouth. Patient not taking: Reported on 10/17/2023    [provider]  Nerve Stimulator (PRO COMFORT TENS UNIT) DEVI by Does not apply route as needed.     [provider]  omeprazole (PRILOSEC) 20 MG capsule Take 20 mg by mouth every morning. 01/09/21   [provider]  ondansetron  (ZOFRAN -ODT) 4 MG disintegrating tablet Take 1 tablet (4 mg total) by mouth every 8 (eight) hours as needed for nausea or vomiting. 12/22/21   Branham, Victoria C, MD  QUEtiapine (SEROQUEL) 300 MG tablet Take 300 mg by mouth at bedtime.    [provider]  Simethicone  (GAS-X PO) Take by mouth as needed.     [provider]  sodium fluoride (PREVIDENT 5000 PLUS) 1.1 % CREA dental cream Denta 5000 Plus 1.1 % cream  USE TWICE A DAY, DO NOT EAT, DRINK, OR RINSE FOR 30 MINUTES AFTER USE Patient not taking: Reported on 08/02/2021    [provider]  topiramate  (TOPAMAX ) 25 MG tablet Take 2 tablets (50 mg total) by mouth at bedtime. 10/17/23   Cheryl Waddell HERO, PA-C  triamcinolone ointment (KENALOG) 0.1 % triamcinolone acetonide 0.1 % topical ointment  APPLY THIN COAT TO AFFECTED AREA TWICE A DAY Patient not taking: Reported on 03/28/2022    [provider]    Physical Exam: BP 114/73   Pulse 80   Temp 98 F (36.7 C) (Oral)   Resp 16   Wt 40.2 kg   SpO2 98%   BMI 15.21 kg/m  General: Pleasant, well-appearing *** laying in bed. No  acute distress. HEENT: [place]/AT. Anicteric sclera CV: RRR. No murmurs, rubs, or gallops. No LE edema Pulmonary: Lungs CTAB. Normal effort. No wheezing or rales. Abdominal: Soft, nontender, nondistended. Normal bowel sounds. Extremities: Palpable radial and DP pulses. Normal ROM. Skin: Warm and dry. No obvious rash or lesions. Neuro: A&Ox3. Moves all extremities. Normal sensation to light touch. No focal deficit. Psych: Normal mood and affect          Labs on Admission:  Basic Metabolic Panel: Recent Labs  Lab 10/27/23 1826 10/28/23 1703  NA 137 140  K 3.3* 4.6  CL 105 105  CO2 23 25  GLUCOSE 119* 151*  BUN 13 18  CREATININE 1.28* 1.36*  CALCIUM 9.6 10.2  MG  --  2.3   Liver Function Tests: Recent Labs  Lab 10/28/23 1703  AST 25  ALT 9  ALKPHOS 50  BILITOT 1.1  PROT 7.5  ALBUMIN 4.2   No results for input(s): LIPASE, AMYLASE in the last 168 hours. No results for input(s): AMMONIA in the last 168 hours. CBC: Recent Labs  Lab 10/27/23 1826 10/28/23 1703  WBC 9.0 12.8*  NEUTROABS  --  11.3*  HGB 14.2 14.8  HCT 43.5 44.3  MCV 95.6 96.1  PLT 201 207   Cardiac Enzymes: No results for input(s): CKTOTAL, CKMB,  CKMBINDEX, TROPONINI in the last 168 hours. BNP (last 3 results) No results for input(s): BNP in the last 8760 hours.  ProBNP (last 3 results) No results for input(s): PROBNP in the last 8760 hours.  CBG: No results for input(s): GLUCAP in the last 168 hours.  Radiological Exams on Admission: CT Renal Stone Study Result Date: 10/28/2023 CLINICAL DATA:  Right-sided pelvic pain. EXAM: CT ABDOMEN AND PELVIS WITHOUT CONTRAST TECHNIQUE: Multidetector CT imaging of the abdomen and pelvis was performed following the standard protocol without IV contrast. RADIATION DOSE REDUCTION: This exam was performed according to the departmental dose-optimization program which includes automated exposure control, adjustment of the mA and/or kV according to patient size and/or use of iterative reconstruction technique. COMPARISON:  None Available. FINDINGS: Lower chest: No acute abnormality. Hepatobiliary: No focal liver abnormality is seen. No gallstones, gallbladder wall thickening, or biliary dilatation. Pancreas: Unremarkable. No pancreatic ductal dilatation or surrounding inflammatory changes. Spleen: Normal in size without focal abnormality. Adrenals/Urinary Tract: Adrenal glands are unremarkable. Kidneys are normal in size without focal lesions. 3 mm, 5 mm and 11 mm nonobstructing renal calculi are seen within the right kidney. A 3 mm nonobstructing left renal calculus is also seen. A 4 mm obstructing renal calculus is seen within the distal right ureter, near the right UVJ. The urinary bladder is poorly distended and subsequently limited in evaluation. Stomach/Bowel: Stomach is within normal limits. Appendix appears normal. No evidence of bowel wall thickening, distention, or inflammatory changes. Vascular/Lymphatic: No significant vascular findings are present. No enlarged abdominal or pelvic lymph nodes. Reproductive: Status post hysterectomy. No adnexal masses. Other: No abdominal wall hernia or  abnormality. No abdominopelvic ascites. Musculoskeletal: No acute or significant osseous findings. IMPRESSION: 1. 4 mm obstructing renal calculus within the distal right ureter, near the right UVJ. 2. Bilateral nonobstructing renal calculi. Electronically Signed   By: Suzen Dials M.D.   On: 10/28/2023 21:52   DG Chest 2 View Result Date: 10/27/2023 CLINICAL DATA:  CP EXAM: CHEST - 2 VIEW COMPARISON:  June 19, 2023 FINDINGS: Hyperinflated lungs. No focal airspace consolidation, pleural effusion, or pneumothorax. No cardiomegaly.No acute fracture or destructive lesion. IMPRESSION: No acute cardiopulmonary  abnormality. Electronically Signed   By: Rogelia Myers M.D.   On: 10/27/2023 18:28   Assessment/Plan Jatoya A Montroy is a 53 y.o. female with medical history significant for bipolar 1 disorder, fibromyalgia, osteoarthritis, asthma, IBS, GERD, restless leg syndrome and migraine who presented to the ED for evaluation of back pain and pelvic pain.   # Infected right ureteral stone  # Acute cystitis  # Bipolar 1 disorder  # Asthma  # GERD  # Fibromyalgia # Insomnia  #***  DVT prophylaxis: Lovenox     Code Status: Not on file  Consults called: Urology  Family Communication: ***  Severity of Illness: The appropriate patient status for this patient is INPATIENT. Inpatient status is judged to be reasonable and necessary in order to provide the required intensity of service to ensure the patient's safety. The patient's presenting symptoms, physical exam findings, and initial radiographic and laboratory data in the context of their chronic comorbidities is felt to place them at high risk for further clinical deterioration. Furthermore, it is not anticipated that the patient will be medically stable for discharge from the hospital within 2 midnights of admission.   * I certify that at the point of admission it is my clinical judgment that the patient will require inpatient hospital  care spanning beyond 2 midnights from the point of admission due to high intensity of service, high risk for further deterioration and high frequency of surveillance required.*  Level of care: Med-Surg   This record has been created using Conservation officer, historic buildings. Errors have been sought and corrected, but may not always be located. Such creation errors do not reflect on the standard of care.   Lou Claretta HERO, MD 10/28/2023, 10:44 PM [facility] Pager: [phone] Isaiah 41:10   If 7PM-7AM, please contact night-coverage [email or web address] Password [email or web address]

## 2023-10-28 NOTE — ED Notes (Signed)
 Patient c/o back spasms and requires wheelchair and assist with tasks.

## 2023-10-28 NOTE — ED Triage Notes (Signed)
 BIB EMS/ c/o right pelvic pain w/ back spasms/seen for CP and pelvic pain yesterday/ vomiting intermittent/ 100/64/ 70HR/ 96%/ 160-BS/ pt is tearful/ placed in wheelchair

## 2023-10-29 ENCOUNTER — Other Ambulatory Visit: Payer: Self-pay | Admitting: Urology

## 2023-10-29 DIAGNOSIS — N201 Calculus of ureter: Secondary | ICD-10-CM | POA: Diagnosis not present

## 2023-10-29 DIAGNOSIS — E43 Unspecified severe protein-calorie malnutrition: Secondary | ICD-10-CM

## 2023-10-29 DIAGNOSIS — N390 Urinary tract infection, site not specified: Secondary | ICD-10-CM

## 2023-10-29 LAB — CBC
HCT: 36.9 % (ref 36.0–46.0)
Hemoglobin: 11.9 g/dL — ABNORMAL LOW (ref 12.0–15.0)
MCH: 32.2 pg (ref 26.0–34.0)
MCHC: 32.2 g/dL (ref 30.0–36.0)
MCV: 99.7 fL (ref 80.0–100.0)
Platelets: 135 10*3/uL — ABNORMAL LOW (ref 150–400)
RBC: 3.7 MIL/uL — ABNORMAL LOW (ref 3.87–5.11)
RDW: 12.2 % (ref 11.5–15.5)
WBC: 9.5 10*3/uL (ref 4.0–10.5)
nRBC: 0 % (ref 0.0–0.2)

## 2023-10-29 LAB — COMPREHENSIVE METABOLIC PANEL WITH GFR
ALT: 7 U/L (ref 0–44)
AST: 14 U/L — ABNORMAL LOW (ref 15–41)
Albumin: 3.2 g/dL — ABNORMAL LOW (ref 3.5–5.0)
Alkaline Phosphatase: 38 U/L (ref 38–126)
Anion gap: 10 (ref 5–15)
BUN: 21 mg/dL — ABNORMAL HIGH (ref 6–20)
CO2: 19 mmol/L — ABNORMAL LOW (ref 22–32)
Calcium: 8.8 mg/dL — ABNORMAL LOW (ref 8.9–10.3)
Chloride: 112 mmol/L — ABNORMAL HIGH (ref 98–111)
Creatinine, Ser: 1.4 mg/dL — ABNORMAL HIGH (ref 0.44–1.00)
GFR, Estimated: 45 mL/min — ABNORMAL LOW (ref >60)
Glucose, Bld: 93 mg/dL (ref 70–99)
Potassium: 3.5 mmol/L (ref 3.5–5.1)
Sodium: 141 mmol/L (ref 135–145)
Total Bilirubin: 0.6 mg/dL (ref 0.0–1.2)
Total Protein: 5.8 g/dL — ABNORMAL LOW (ref 6.5–8.1)

## 2023-10-29 LAB — HIV ANTIBODY (ROUTINE TESTING W REFLEX): HIV Screen 4th Generation wRfx: NONREACTIVE

## 2023-10-29 MED ORDER — IPRATROPIUM BROMIDE 0.03 % NA SOLN
1.0000 | Freq: Two times a day (BID) | NASAL | Status: DC
  Administered 2023-10-29 – 2023-11-01 (×6): 1 via NASAL
  Filled 2023-10-29: qty 30

## 2023-10-29 MED ORDER — IPRATROPIUM-ALBUTEROL 0.5-2.5 (3) MG/3ML IN SOLN: 3.0000 mL | Freq: Four times a day (QID) | RESPIRATORY_TRACT | Status: DC | PRN

## 2023-10-29 MED ORDER — PANTOPRAZOLE SODIUM 40 MG PO TBEC
40.0000 mg | DELAYED_RELEASE_TABLET | Freq: Every day | ORAL | Status: DC
  Administered 2023-10-29 – 2023-11-01 (×3): 40 mg via ORAL
  Filled 2023-10-29 (×3): qty 1

## 2023-10-29 MED ORDER — METOPROLOL TARTRATE 5 MG/5ML IV SOLN: 5.0000 mg | INTRAVENOUS | Status: DC | PRN

## 2023-10-29 MED ORDER — FLUTICASONE PROPIONATE 50 MCG/ACT NA SUSP
2.0000 | Freq: Every day | NASAL | Status: DC
  Administered 2023-10-29 – 2023-11-01 (×3): 2 via NASAL
  Filled 2023-10-29: qty 16

## 2023-10-29 MED ORDER — LORATADINE 10 MG PO TABS
10.0000 mg | ORAL_TABLET | Freq: Every day | ORAL | Status: DC
  Administered 2023-10-29 – 2023-11-01 (×3): 10 mg via ORAL
  Filled 2023-10-29 (×3): qty 1

## 2023-10-29 MED ORDER — HYDRALAZINE HCL 20 MG/ML IJ SOLN: 10.0000 mg | INTRAMUSCULAR | Status: DC | PRN

## 2023-10-29 MED ORDER — TOPIRAMATE 25 MG PO TABS
50.0000 mg | ORAL_TABLET | Freq: Every day | ORAL | Status: DC
  Administered 2023-10-29 – 2023-10-31 (×3): 50 mg via ORAL
  Filled 2023-10-29 (×3): qty 2

## 2023-10-29 MED ORDER — CLONAZEPAM 1 MG PO TABS
1.0000 mg | ORAL_TABLET | Freq: Two times a day (BID) | ORAL | Status: DC
  Administered 2023-10-29 – 2023-11-01 (×6): 1 mg via ORAL
  Filled 2023-10-29 (×6): qty 1

## 2023-10-29 MED ORDER — BUPROPION HCL ER (XL) 150 MG PO TB24
150.0000 mg | ORAL_TABLET | Freq: Every day | ORAL | Status: DC
  Administered 2023-10-29 – 2023-11-01 (×3): 150 mg via ORAL
  Filled 2023-10-29 (×3): qty 1

## 2023-10-29 MED ORDER — FLUTICASONE FUROATE-VILANTEROL 200-25 MCG/ACT IN AEPB
1.0000 | INHALATION_SPRAY | Freq: Every day | RESPIRATORY_TRACT | Status: DC
  Administered 2023-10-29 – 2023-11-01 (×4): 1 via RESPIRATORY_TRACT
  Filled 2023-10-29: qty 28

## 2023-10-29 MED ORDER — LAMOTRIGINE 100 MG PO TABS
200.0000 mg | ORAL_TABLET | Freq: Every day | ORAL | Status: DC
  Administered 2023-10-29 – 2023-11-01 (×3): 200 mg via ORAL
  Filled 2023-10-29 (×3): qty 2

## 2023-10-29 MED ORDER — SODIUM CHLORIDE 0.9 % IV SOLN: INTRAVENOUS | Status: AC

## 2023-10-29 MED ORDER — SODIUM CHLORIDE 0.9 % IV SOLN
1.0000 g | INTRAVENOUS | Status: DC
  Administered 2023-10-29 – 2023-11-01 (×4): 1 g via INTRAVENOUS
  Filled 2023-10-29 (×4): qty 10

## 2023-10-29 NOTE — Anesthesia Preprocedure Evaluation (Signed)
 Anesthesia Evaluation  Patient identified by MRN, date of birth, ID band Patient awake    Reviewed: Allergy & Precautions, NPO status , Patient's Chart, lab work & pertinent test results  Airway Mallampati: I  TM Distance: >3 FB Neck ROM: Full    Dental  (+) Teeth Intact, Dental Advisory Given   Pulmonary neg pulmonary ROS   Pulmonary exam normal breath sounds clear to auscultation       Cardiovascular negative cardio ROS Normal cardiovascular exam Rhythm:Regular Rate:Normal     Neuro/Psych  Headaches PSYCHIATRIC DISORDERS   Bipolar Disorder      GI/Hepatic Neg liver ROS,GERD  Controlled,,  Endo/Other  negative endocrine ROS    Renal/GU Renal disease (cr 1.4)  negative genitourinary   Musculoskeletal  (+) Arthritis , Osteoarthritis,  Fibromyalgia -  Abdominal   Peds  Hematology negative hematology ROS (+) Hb 11.9, plt 135   Anesthesia Other Findings   Reproductive/Obstetrics negative OB ROS                              Anesthesia Physical Anesthesia Plan  ASA: 2  Anesthesia Plan: General   Post-op Pain Management: Tylenol  PO (pre-op)*   Induction: Intravenous  PONV Risk Score and Plan: Ondansetron , Dexamethasone  and Midazolam  Airway Management Planned: LMA  Additional Equipment:   Intra-op Plan:   Post-operative Plan: Extubation in OR  Informed Consent: I have reviewed the patients History and Physical, chart, labs and discussed the procedure including the risks, benefits and alternatives for the proposed anesthesia with the patient or authorized representative who has indicated his/her understanding and acceptance.   Patient has DNR.  Suspend DNR.   Dental advisory given  Plan Discussed with: CRNA  Anesthesia Plan Comments:         Anesthesia Quick Evaluation

## 2023-10-29 NOTE — Consult Note (Signed)
 Urology Consult Note   Requesting Attending Physician:  Caleen Burgess BROCKS, MD Service Providing Consult: Urology   Reason for Consult:  Nephrolithiasis  HPI: Felicia Rivera, hx of bipolar, fibromyalgia, IBS, GERD, migraines, is seen in consultation for reasons noted above at the request of Amin, Ankit C, MD for evaluation of right flank pain and back spasms with associated nausea/vomiting, but denies any fevers or chills, no dysuria or hematuria.  Afebrile and hemodynamically stable on arrival, labs notable for initial slight leukocytosis that has resolved, stable hgb, Cr 1.4, (baseline difficult to determine but between 1-1.2 likely). UA with nitrites, LE positive, 11-20 WBCs, and rare bacteria, urine culture pending.   Imaging with multiple non-obstructing stones bilaterally and a 4mm distal right UVJ stone, mild pelvic fullness vs extrarenal pelvis but no significant hydronephrosis.   She has no previous history of kidney stones. She does endorse a family history of kidney stones  Assessment:  53 y.o. female with hx above who presented with right flank pain, and CT that demonstrates multiple non-obstructing stones bilaterally and a 4mm distal R UVJ stone  Concerning for infection given: UA In the ED received: 2g CTX  We discussed the indications for acute intervention including infected obstruction, bilateral ureteral obstruction or unilateral obstruction of solitary kidney as well as other less urgent indications for decompression which would included intractable pain, N/V, and acute renal injury. We also discussed the possible inability to place a ureteral stent in which case, the next intervention recommended would be a percutaneous nephrostomy tube. Given c/f infected obstruction is present, plan for right ureteral stent placement.   Recommendations: Plan for right ureteral stent placement, likely tomorrow 10/30/23 If patient fevers, or clinically decompensates please page the urology  pager for consideration of emergent placement Continue scheduled CTX q24hr for abx Anticipate outpatient ureteroscopy for definitive stone treatment after stent placement Patient can eat with plans for repeat NPO at midnight tonight   Thank you for this consult. Please contact the urology consult pager with any further questions/concerns.   Past Medical History: Past Medical History:  Diagnosis Date   Arthritis    Bipolar 1 disorder (HCC) 02/28/2016   Bipolar disorder (HCC) 02/28/2016   Bursitis    Fibromyalgia 02/28/2016   GERD (gastroesophageal reflux disease) 02/28/2016   IBS (irritable bowel syndrome) 02/28/2016   Migraine 02/28/2016   Periodic limb movement disorder (PLMD)    RLS (restless legs syndrome) 02/28/2016   Ulnar neuropathy at elbow, left 02/28/2016   Vitamin D  deficiency 02/28/2016    Past Surgical History:  Past Surgical History:  Procedure Laterality Date   ABDOMINAL HYSTERECTOMY     dental work  04/16/2017   x2    Medication: Current Facility-Administered Medications  Medication Dose Route Frequency Provider Last Rate Last Admin   0.9 %  sodium chloride  infusion   Intravenous Continuous Amponsah, Prosper M, MD 125 mL/hr at 10/29/23 0300 New Bag at 10/29/23 0300   acetaminophen  (TYLENOL ) tablet 650 mg  650 mg Oral Q6H PRN Amponsah, Prosper M, MD       Or   acetaminophen  (TYLENOL ) suppository 650 mg  650 mg Rectal Q6H PRN Lou Claretta HERO, MD       buPROPion (WELLBUTRIN XL) 24 hr tablet 150 mg  150 mg Oral Daily Amponsah, Prosper M, MD       cefTRIAXone (ROCEPHIN) 1 g in sodium chloride  0.9 % 100 mL IVPB  1 g Intravenous Q24H Lou Claretta HERO, MD  clonazePAM (KLONOPIN) tablet 1 mg  1 mg Oral BID Amponsah, Prosper M, MD       enoxaparin (LOVENOX) injection 30 mg  30 mg Subcutaneous Q24H Amponsah, Prosper M, MD       fluticasone (FLONASE) 50 MCG/ACT nasal spray 2 spray  2 spray Each Nare Daily Lou Claretta HERO, MD       fluticasone  furoate-vilanterol (BREO ELLIPTA) 200-25 MCG/ACT 1 puff  1 puff Inhalation Daily Lou Claretta HERO, MD       HYDROmorphone (DILAUDID) injection 0.5 mg  0.5 mg Intravenous Q6H PRN Amponsah, Prosper M, MD   0.5 mg at 10/29/23 0519   ipratropium (ATROVENT) 0.03 % nasal spray 1 spray  1 spray Nasal BID Amponsah, Prosper M, MD       ipratropium-albuterol (DUONEB) 0.5-2.5 (3) MG/3ML nebulizer solution 3 mL  3 mL Nebulization Q6H PRN Lou Claretta HERO, MD       lamoTRIgine (LAMICTAL) tablet 200 mg  200 mg Oral Daily Amponsah, Prosper M, MD       loratadine (CLARITIN) tablet 10 mg  10 mg Oral Daily Amponsah, Prosper M, MD       ondansetron  (ZOFRAN ) tablet 4 mg  4 mg Oral Q6H PRN Amponsah, Prosper M, MD       Or   ondansetron  (ZOFRAN ) injection 4 mg  4 mg Intravenous Q6H PRN Amponsah, Prosper M, MD       pantoprazole (PROTONIX) EC tablet 40 mg  40 mg Oral Daily Amponsah, Prosper M, MD       QUEtiapine (SEROQUEL) tablet 300 mg  300 mg Oral QHS Amponsah, Prosper M, MD   300 mg at 10/28/23 2239   senna-docusate (Senokot-S) tablet 1 tablet  1 tablet Oral QHS PRN Amponsah, Prosper M, MD       tamsulosin (FLOMAX) capsule 0.4 mg  0.4 mg Oral Daily Harris, Abigail, PA-C       topiramate  (TOPAMAX ) tablet 50 mg  50 mg Oral QHS Amponsah, Prosper M, MD        Allergies: Allergies  Allergen Reactions   Benztropine  Other (See Comments)    Dehydration and cognitive impairment    Other reaction(s): Unknown  Other Reaction(s): Not available, shaky  Cogentin    Cefdinir Nausea Only and Other (See Comments)    Other reaction(s): Unknown   Clarithromycin Other (See Comments)    Other reaction(s): Unknown   Erythromycin Nausea Only   Hydrocodone Nausea Only   Nsaids Other (See Comments)    Other reaction(s): Unknown   Pregabalin Other (See Comments)    Jittery  Other reaction(s): Unknown    Social History: Social History   Tobacco Use   Smoking status: Never    Passive exposure: Past   Smokeless  tobacco: Never  Vaping Use   Vaping status: Never Used  Substance Use Topics   Alcohol use: Yes    Comment: Occassionally   Drug use: Never    Family History Family History  Problem Relation Age of Onset   Kidney failure Mother        Stage 3   Diabetes Mother    Glaucoma Mother    Heart Problems Mother        pacemaker, defibrilator    Hypertension Mother    Congestive Heart Failure Mother    Rheum arthritis Mother        possibly   COPD Mother    Emphysema Mother    Hypertension Father    High Cholesterol Father  Heart Problems Father    Leukemia Father    Acute myelogenous leukemia Father     Review of Systems 10 systems were reviewed and are negative except as noted specifically in the HPI.  Objective   Vital signs in last 24 hours: BP 96/60 (BP Location: Right Arm)   Pulse 79   Temp 97.6 F (36.4 C)   Resp 16   Ht 5' 4 (1.626 m)   Wt 43 kg   SpO2 98%   BMI 16.27 kg/m   Physical Exam General: NAD, A&O, resting, appropriate HEENT: [place]/AT, EOMI, MMM Pulmonary: Normal work of breathing Cardiovascular: HDS, adequate peripheral perfusion Abdomen: Soft, NTTP, nondistended. GU: Voiding spontaneously, no CVA tenderness Extremities: warm and well perfused Neuro: Appropriate, no focal neurological deficits  Most Recent Labs: Lab Results  Component Value Date   WBC 9.5 10/29/2023   HGB 11.9 (L) 10/29/2023   HCT 36.9 10/29/2023   PLT 135 (L) 10/29/2023    Lab Results  Component Value Date   NA 141 10/29/2023   K 3.5 10/29/2023   CL 112 (H) 10/29/2023   CO2 19 (L) 10/29/2023   BUN 21 (H) 10/29/2023   CREATININE 1.40 (H) 10/29/2023   CALCIUM 8.8 (L) 10/29/2023   MG 2.3 10/28/2023    No results found for: INR, APTT   Urine Culture: @[id number] (laburin,org,[id number])@   IMAGING: CT Renal Stone Study Result Date: 10/28/2023 CLINICAL DATA:  Right-sided pelvic pain. EXAM: CT ABDOMEN AND PELVIS WITHOUT CONTRAST TECHNIQUE: Multidetector CT  imaging of the abdomen and pelvis was performed following the standard protocol without IV contrast. RADIATION DOSE REDUCTION: This exam was performed according to the departmental dose-optimization program which includes automated exposure control, adjustment of the mA and/or kV according to patient size and/or use of iterative reconstruction technique. COMPARISON:  None Available. FINDINGS: Lower chest: No acute abnormality. Hepatobiliary: No focal liver abnormality is seen. No gallstones, gallbladder wall thickening, or biliary dilatation. Pancreas: Unremarkable. No pancreatic ductal dilatation or surrounding inflammatory changes. Spleen: Normal in size without focal abnormality. Adrenals/Urinary Tract: Adrenal glands are unremarkable. Kidneys are normal in size without focal lesions. 3 mm, 5 mm and 11 mm nonobstructing renal calculi are seen within the right kidney. A 3 mm nonobstructing left renal calculus is also seen. A 4 mm obstructing renal calculus is seen within the distal right ureter, near the right UVJ. The urinary bladder is poorly distended and subsequently limited in evaluation. Stomach/Bowel: Stomach is within normal limits. Appendix appears normal. No evidence of bowel wall thickening, distention, or inflammatory changes. Vascular/Lymphatic: No significant vascular findings are present. No enlarged abdominal or pelvic lymph nodes. Reproductive: Status post hysterectomy. No adnexal masses. Other: No abdominal wall hernia or abnormality. No abdominopelvic ascites. Musculoskeletal: No acute or significant osseous findings. IMPRESSION: 1. 4 mm obstructing renal calculus within the distal right ureter, near the right UVJ. 2. Bilateral nonobstructing renal calculi. Electronically Signed   By: Suzen Dials M.D.   On: 10/28/2023 21:52   DG Chest 2 View Result Date: 10/27/2023 CLINICAL DATA:  CP EXAM: CHEST - 2 VIEW COMPARISON:  June 19, 2023 FINDINGS: Hyperinflated lungs. No focal airspace  consolidation, pleural effusion, or pneumothorax. No cardiomegaly.No acute fracture or destructive lesion. IMPRESSION: No acute cardiopulmonary abnormality. Electronically Signed   By: Rogelia Myers M.D.   On: 10/27/2023 18:28    ------

## 2023-10-29 NOTE — Hospital Course (Addendum)
 Brief Narrative:  53 year old with history of bipolar 1, fibromyalgia, OA, asthma, IBS, GERD, RLS, migraine comes to the ED for evaluation of back pain and flank pain.  CT renal stone shows 4 mm obstructing renal stone started on IV antibiotics, urology team consulted.  Planning for cystoscopy with stent placement on 7/2.   Assessment & Plan:  Principal Problem:   Right ureteral stone Active Problems:   Urinary tract infection with hematuria   Severe protein-calorie malnutrition (HCC)   Urinary tract infection Obstructive nephropathy secondary to renal stone, 4 mm - Currently on IV Rocephin, monitor urine culture.  Seen by urology, plans for right ureteral stent placement on 7/2 per urology.  Bipolar 1 Fibromyalgia/insomnia - On Klonopin, bupropion and Lamictal - Follows outpatient rheumatology - Continue Topamax   Asthma - As needed bronchodilator  Allergic rhinitis - Flonase, Atrovent  GERD - PPI  Severe protein calorie malnutrition Body mass index is 16.27 kg/m.  -Encourage p.o. intake

## 2023-10-29 NOTE — Progress Notes (Signed)
 PROGRESS NOTE    Felicia Rivera  FMW:[id number] DOB: Jul 05, 1970 DOA: 10/28/2023 PCP: Sun, Vyvyan, MD    Brief Narrative:  53 year old with history of bipolar 1, fibromyalgia, OA, asthma, IBS, GERD, RLS, migraine comes to the ED for evaluation of back pain and flank pain.  CT renal stone shows 4 mm obstructing renal stone started on IV antibiotics, urology team consulted.  Planning for cystoscopy with stent placement on 7/2.   Assessment & Plan:  Principal Problem:   Right ureteral stone Active Problems:   Urinary tract infection with hematuria   Severe protein-calorie malnutrition (HCC)   Urinary tract infection Obstructive nephropathy secondary to renal stone, 4 mm - Currently on IV Rocephin, monitor urine culture.  Seen by urology, plans for right ureteral stent placement on 7/2 per urology.  Bipolar 1 Fibromyalgia/insomnia - On Klonopin, bupropion and Lamictal - Follows outpatient rheumatology - Continue Topamax   Asthma - As needed bronchodilator  Allergic rhinitis - Flonase, Atrovent  GERD - PPI  Severe protein calorie malnutrition -Encourage p.o. intake    DVT prophylaxis: enoxaparin (LOVENOX) injection 30 mg Start: 10/29/23 1000    Code Status: Limited: Do not attempt resuscitation (DNR) -DNR-LIMITED -Do Not Intubate/DNI  Family Communication:   Status is: Inpatient During hospital stay for cystoscopy tomorrow  Subjective: Feels slightly better this morning but overall still very weak   Examination:  General exam: Appears calm and comfortable  Respiratory system: Clear to auscultation. Respiratory effort normal. Cardiovascular system: S1 & S2 heard, RRR. No JVD, murmurs, rubs, gallops or clicks. No pedal edema. Gastrointestinal system: Abdomen is nondistended, soft and nontender. No organomegaly or masses felt. Normal bowel sounds heard. Central nervous system: Alert and oriented. No focal neurological deficits. Extremities: Symmetric 5 x 5  power. Skin: No rashes, lesions or ulcers Psychiatry: Judgement and insight appear normal. Mood & affect appropriate.                Diet Orders (From admission, onward)     Start     Ordered   10/30/23 0001  Diet NPO time specified  Diet effective midnight        10/29/23 1135   10/29/23 1135  Diet regular Room service appropriate? Yes; Fluid consistency: Thin  Diet effective now       Question Answer Comment  Room service appropriate? Yes   Fluid consistency: Thin      10/29/23 1134            Objective: Vitals:   10/29/23 0213 10/29/23 0510 10/29/23 0839 10/29/23 0917  BP: 96/60 96/60  (!) 97/59  Pulse: 76 79  87  Resp: 15 16  14   Temp: 97.6 F (36.4 C) 97.6 F (36.4 C)  97.8 F (36.6 C)  TempSrc:      SpO2: 97% 98% 98% 97%  Weight:      Height:        Intake/Output Summary (Last 24 hours) at 10/29/2023 1157 Last data filed at 10/29/2023 1000 Gross per 24 hour  Intake 1070 ml  Output 300 ml  Net 770 ml   Filed Weights   10/28/23 1655 10/29/23 0100  Weight: 40.2 kg 43 kg    Scheduled Meds:  buPROPion  150 mg Oral Daily   clonazePAM  1 mg Oral BID   enoxaparin (LOVENOX) injection  30 mg Subcutaneous Q24H   fluticasone  2 spray Each Nare Daily   fluticasone furoate-vilanterol  1 puff Inhalation Daily   ipratropium  1 spray Nasal  BID   lamoTRIgine  200 mg Oral Daily   loratadine  10 mg Oral Daily   pantoprazole  40 mg Oral Daily   QUEtiapine  300 mg Oral QHS   tamsulosin  0.4 mg Oral Daily   topiramate   50 mg Oral QHS   Continuous Infusions:  sodium chloride  100 mL/hr at 10/29/23 0900   cefTRIAXone (ROCEPHIN)  IV      Nutritional status     Body mass index is 16.27 kg/m.  Data Reviewed:   CBC: Recent Labs  Lab 10/27/23 1826 10/28/23 1703 10/29/23 0333  WBC 9.0 12.8* 9.5  NEUTROABS  --  11.3*  --   HGB 14.2 14.8 11.9*  HCT 43.5 44.3 36.9  MCV 95.6 96.1 99.7  PLT 201 207 135*   Basic Metabolic Panel: Recent Labs  Lab  10/27/23 1826 10/28/23 1703 10/29/23 0333  NA 137 140 141  K 3.3* 4.6 3.5  CL 105 105 112*  CO2 23 25 19*  GLUCOSE 119* 151* 93  BUN 13 18 21*  CREATININE 1.28* 1.36* 1.40*  CALCIUM 9.6 10.2 8.8*  MG  --  2.3  --    GFR: Estimated Creatinine Clearance: 31.9 mL/min (A) (by C-G formula based on SCr of 1.4 mg/dL (H)). Liver Function Tests: Recent Labs  Lab 10/28/23 1703 10/29/23 0333  AST 25 14*  ALT 9 7  ALKPHOS 50 38  BILITOT 1.1 0.6  PROT 7.5 5.8*  ALBUMIN 4.2 3.2*   No results for input(s): LIPASE, AMYLASE in the last 168 hours. No results for input(s): AMMONIA in the last 168 hours. Coagulation Profile: No results for input(s): INR, PROTIME in the last 168 hours. Cardiac Enzymes: No results for input(s): CKTOTAL, CKMB, CKMBINDEX, TROPONINI in the last 168 hours. BNP (last 3 results) No results for input(s): PROBNP in the last 8760 hours. HbA1C: No results for input(s): HGBA1C in the last 72 hours. CBG: No results for input(s): GLUCAP in the last 168 hours. Lipid Profile: No results for input(s): CHOL, HDL, LDLCALC, TRIG, CHOLHDL, LDLDIRECT in the last 72 hours. Thyroid  Function Tests: No results for input(s): TSH, T4TOTAL, FREET4, T3FREE, THYROIDAB in the last 72 hours. Anemia Panel: No results for input(s): VITAMINB12, FOLATE, FERRITIN, TIBC, IRON, RETICCTPCT in the last 72 hours. Sepsis Labs: No results for input(s): PROCALCITON, LATICACIDVEN in the last 168 hours.  No results found for this or any previous visit (from the past 240 hours).       Radiology Studies: CT Renal Stone Study Result Date: 10/28/2023 CLINICAL DATA:  Right-sided pelvic pain. EXAM: CT ABDOMEN AND PELVIS WITHOUT CONTRAST TECHNIQUE: Multidetector CT imaging of the abdomen and pelvis was performed following the standard protocol without IV contrast. RADIATION DOSE REDUCTION: This exam was performed according to the  departmental dose-optimization program which includes automated exposure control, adjustment of the mA and/or kV according to patient size and/or use of iterative reconstruction technique. COMPARISON:  None Available. FINDINGS: Lower chest: No acute abnormality. Hepatobiliary: No focal liver abnormality is seen. No gallstones, gallbladder wall thickening, or biliary dilatation. Pancreas: Unremarkable. No pancreatic ductal dilatation or surrounding inflammatory changes. Spleen: Normal in size without focal abnormality. Adrenals/Urinary Tract: Adrenal glands are unremarkable. Kidneys are normal in size without focal lesions. 3 mm, 5 mm and 11 mm nonobstructing renal calculi are seen within the right kidney. A 3 mm nonobstructing left renal calculus is also seen. A 4 mm obstructing renal calculus is seen within the distal right ureter, near the right UVJ. The  urinary bladder is poorly distended and subsequently limited in evaluation. Stomach/Bowel: Stomach is within normal limits. Appendix appears normal. No evidence of bowel wall thickening, distention, or inflammatory changes. Vascular/Lymphatic: No significant vascular findings are present. No enlarged abdominal or pelvic lymph nodes. Reproductive: Status post hysterectomy. No adnexal masses. Other: No abdominal wall hernia or abnormality. No abdominopelvic ascites. Musculoskeletal: No acute or significant osseous findings. IMPRESSION: 1. 4 mm obstructing renal calculus within the distal right ureter, near the right UVJ. 2. Bilateral nonobstructing renal calculi. Electronically Signed   By: Suzen Dials M.D.   On: 10/28/2023 21:52   DG Chest 2 View Result Date: 10/27/2023 CLINICAL DATA:  CP EXAM: CHEST - 2 VIEW COMPARISON:  June 19, 2023 FINDINGS: Hyperinflated lungs. No focal airspace consolidation, pleural effusion, or pneumothorax. No cardiomegaly.No acute fracture or destructive lesion. IMPRESSION: No acute cardiopulmonary abnormality. Electronically  Signed   By: Rogelia Myers M.D.   On: 10/27/2023 18:28           LOS: 1 day   Time spent= 35 mins    Burgess JAYSON Dare, MD [facility]  If 7PM-7AM, please contact night-coverage  10/29/2023, 11:57 AM

## 2023-10-30 ENCOUNTER — Encounter (HOSPITAL_COMMUNITY): Payer: Self-pay | Admitting: Student

## 2023-10-30 ENCOUNTER — Encounter (HOSPITAL_COMMUNITY): Payer: Self-pay | Admitting: Anesthesiology

## 2023-10-30 ENCOUNTER — Encounter (HOSPITAL_COMMUNITY)
Admission: EM | Disposition: A | Discharge status: Home / Self Care | Payer: Self-pay | Source: Home / Self Care | Attending: Family Medicine

## 2023-10-30 ENCOUNTER — Inpatient Hospital Stay (HOSPITAL_COMMUNITY)

## 2023-10-30 DIAGNOSIS — N132 Hydronephrosis with renal and ureteral calculous obstruction: Secondary | ICD-10-CM | POA: Diagnosis not present

## 2023-10-30 DIAGNOSIS — N201 Calculus of ureter: Secondary | ICD-10-CM | POA: Diagnosis not present

## 2023-10-30 HISTORY — PX: CYSTOSCOPY/URETEROSCOPY/HOLMIUM LASER/STENT PLACEMENT: SHX6546

## 2023-10-30 LAB — CBC
HCT: 32.5 % — ABNORMAL LOW (ref 36.0–46.0)
Hemoglobin: 10.6 g/dL — ABNORMAL LOW (ref 12.0–15.0)
MCH: 32.3 pg (ref 26.0–34.0)
MCHC: 32.6 g/dL (ref 30.0–36.0)
MCV: 99.1 fL (ref 80.0–100.0)
Platelets: 114 10*3/uL — ABNORMAL LOW (ref 150–400)
RBC: 3.28 MIL/uL — ABNORMAL LOW (ref 3.87–5.11)
RDW: 12.5 % (ref 11.5–15.5)
WBC: 6.4 10*3/uL (ref 4.0–10.5)
nRBC: 0 % (ref 0.0–0.2)

## 2023-10-30 LAB — BASIC METABOLIC PANEL WITH GFR
Anion gap: 5 (ref 5–15)
BUN: 17 mg/dL (ref 6–20)
CO2: 21 mmol/L — ABNORMAL LOW (ref 22–32)
Calcium: 8 mg/dL — ABNORMAL LOW (ref 8.9–10.3)
Chloride: 113 mmol/L — ABNORMAL HIGH (ref 98–111)
Creatinine, Ser: 1.66 mg/dL — ABNORMAL HIGH (ref 0.44–1.00)
GFR, Estimated: 37 mL/min — ABNORMAL LOW (ref >60)
Glucose, Bld: 91 mg/dL (ref 70–99)
Potassium: 3.1 mmol/L — ABNORMAL LOW (ref 3.5–5.1)
Sodium: 139 mmol/L (ref 135–145)

## 2023-10-30 LAB — URINE CULTURE: Culture: >=100000 — AB

## 2023-10-30 LAB — SURGICAL PCR SCREEN
MRSA, PCR: NEGATIVE
Staphylococcus aureus: POSITIVE — AB

## 2023-10-30 LAB — PHOSPHORUS: Phosphorus: 3.3 mg/dL (ref 2.5–4.6)

## 2023-10-30 LAB — MAGNESIUM: Magnesium: 1.7 mg/dL (ref 1.7–2.4)

## 2023-10-30 SURGERY — CYSTOSCOPY/URETEROSCOPY/HOLMIUM LASER/STENT PLACEMENT
Anesthesia: General | Site: Pelvis | Laterality: Right

## 2023-10-30 MED ORDER — DEXAMETHASONE SODIUM PHOSPHATE 4 MG/ML IJ SOLN
INTRAMUSCULAR | Status: DC | PRN
  Administered 2023-10-30: 5 mg via INTRAVENOUS

## 2023-10-30 MED ORDER — OXYCODONE HCL 5 MG/5ML PO SOLN: 5.0000 mg | Freq: Once | ORAL | Status: DC | PRN

## 2023-10-30 MED ORDER — OXYCODONE HCL 5 MG PO TABS: 5.0000 mg | ORAL_TABLET | Freq: Once | ORAL | Status: DC | PRN

## 2023-10-30 MED ORDER — FENTANYL CITRATE (PF) 100 MCG/2ML IJ SOLN
INTRAMUSCULAR | Status: AC
Start: 2023-10-30 — End: 2023-10-30
  Filled 2023-10-30: qty 2

## 2023-10-30 MED ORDER — LACTATED RINGERS IV SOLN: INTRAVENOUS | Status: DC | PRN

## 2023-10-30 MED ORDER — MIDAZOLAM HCL 2 MG/2ML IJ SOLN
INTRAMUSCULAR | Status: AC
  Filled 2023-10-30: qty 2

## 2023-10-30 MED ORDER — PHENYLEPHRINE 80 MCG/ML (10ML) SYRINGE FOR IV PUSH (FOR BLOOD PRESSURE SUPPORT)
PREFILLED_SYRINGE | INTRAVENOUS | Status: DC | PRN
  Administered 2023-10-30 (×7): 80 ug via INTRAVENOUS

## 2023-10-30 MED ORDER — PHENYLEPHRINE HCL (PRESSORS) 10 MG/ML IV SOLN: INTRAVENOUS | Status: DC | PRN

## 2023-10-30 MED ORDER — ONDANSETRON HCL 4 MG/2ML IJ SOLN
INTRAMUSCULAR | Status: DC | PRN
  Administered 2023-10-30: 4 mg via INTRAVENOUS

## 2023-10-30 MED ORDER — IOHEXOL 300 MG/ML  SOLN
INTRAMUSCULAR | Status: DC | PRN
  Administered 2023-10-30: 14 mL

## 2023-10-30 MED ORDER — POTASSIUM CHLORIDE CRYS ER 20 MEQ PO TBCR
40.0000 meq | EXTENDED_RELEASE_TABLET | Freq: Once | ORAL | Status: AC
  Administered 2023-10-30: 30 meq via ORAL
  Filled 2023-10-30: qty 2

## 2023-10-30 MED ORDER — FENTANYL CITRATE (PF) 100 MCG/2ML IJ SOLN
INTRAMUSCULAR | Status: DC | PRN
  Administered 2023-10-30: 50 ug via INTRAVENOUS

## 2023-10-30 MED ORDER — LACTATED RINGERS IV SOLN: INTRAVENOUS | Status: DC

## 2023-10-30 MED ORDER — FENTANYL CITRATE PF 50 MCG/ML IJ SOSY: 25.0000 ug | PREFILLED_SYRINGE | INTRAMUSCULAR | Status: DC | PRN

## 2023-10-30 MED ORDER — CHLORHEXIDINE GLUCONATE CLOTH 2 % EX PADS: 6.0000 | MEDICATED_PAD | Freq: Every day | CUTANEOUS | Status: DC

## 2023-10-30 MED ORDER — ACETAMINOPHEN 500 MG PO TABS
1000.0000 mg | ORAL_TABLET | Freq: Once | ORAL | Status: DC
  Filled 2023-10-30 (×2): qty 2

## 2023-10-30 MED ORDER — SODIUM CHLORIDE 0.9 % IR SOLN
Status: DC | PRN
Start: 2023-10-30 — End: 2023-10-30
  Administered 2023-10-30: 3000 mL via INTRAVESICAL

## 2023-10-30 MED ORDER — CHLORHEXIDINE GLUCONATE 0.12 % MT SOLN
15.0000 mL | Freq: Once | OROMUCOSAL | Status: AC
  Administered 2023-10-30: 15 mL via OROMUCOSAL

## 2023-10-30 MED ORDER — MUPIROCIN 2 % EX OINT
1.0000 | TOPICAL_OINTMENT | Freq: Two times a day (BID) | CUTANEOUS | Status: DC
  Administered 2023-10-31 – 2023-11-01 (×4): 1 via NASAL
  Filled 2023-10-30: qty 22

## 2023-10-30 MED ORDER — ONDANSETRON HCL 4 MG/2ML IJ SOLN: 4.0000 mg | Freq: Once | INTRAMUSCULAR | Status: DC | PRN

## 2023-10-30 MED ORDER — PROPOFOL 10 MG/ML IV BOLUS
INTRAVENOUS | Status: DC | PRN
  Administered 2023-10-30: 100 mg via INTRAVENOUS

## 2023-10-30 MED ORDER — MIDAZOLAM HCL 2 MG/2ML IJ SOLN
INTRAMUSCULAR | Status: DC | PRN
  Administered 2023-10-30: 2 mg via INTRAVENOUS

## 2023-10-30 MED ORDER — AMISULPRIDE (ANTIEMETIC) 5 MG/2ML IV SOLN: 10.0000 mg | Freq: Once | INTRAVENOUS | Status: DC | PRN

## 2023-10-30 SURGICAL SUPPLY — 20 items
BAG COUNTER SPONGE SURGICOUNT (BAG) IMPLANT
BAG URO CATCHER STRL LF (MISCELLANEOUS) ×1 IMPLANT
BASKET ZERO TIP NITINOL 2.4FR (BASKET) IMPLANT
CATH URETERAL DUAL LUMEN 10F (MISCELLANEOUS) ×1 IMPLANT
CATH URETL OPEN END 6FR 70 (CATHETERS) IMPLANT
CLOTH BEACON ORANGE TIMEOUT ST (SAFETY) ×1 IMPLANT
FIBER LASER MOSES 200 DFL (Laser) IMPLANT
GLOVE SS BIOGEL STRL SZ 7 (GLOVE) ×1 IMPLANT
GOWN STRL REUS W/ TWL XL LVL3 (GOWN DISPOSABLE) ×1 IMPLANT
GUIDEWIRE STR DUAL SENSOR (WIRE) IMPLANT
GUIDEWIRE ZIPWRE .038 STRAIGHT (WIRE) ×1 IMPLANT
IV NS 1000ML BAXH (IV SOLUTION) ×1 IMPLANT
KIT TURNOVER KIT A (KITS) ×1 IMPLANT
MANIFOLD NEPTUNE II (INSTRUMENTS) ×1 IMPLANT
PACK CYSTO (CUSTOM PROCEDURE TRAY) ×1 IMPLANT
SHEATH NAVIGATOR HD 11/13X36 (SHEATH) IMPLANT
STENT URET 6FRX24 CONTOUR (STENTS) IMPLANT
TRACTIP FLEXIVA PULS ID 200XHI (Laser) IMPLANT
TUBING CONNECTING 10 (TUBING) ×1 IMPLANT
TUBING UROLOGY SET (TUBING) ×1 IMPLANT
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
## 2023-10-30 NOTE — Anesthesia Postprocedure Evaluation (Signed)
 Anesthesia Post Note  Patient: Felicia Rivera  Procedure(s) Performed: CYSTOSCOPY/URETEROSCOPY/HOLMIUM LASER/STENT PLACEMENT (Right: Pelvis)     Patient location during evaluation: PACU Anesthesia Type: General Level of consciousness: awake and alert, oriented and patient cooperative Pain management: pain level controlled Vital Signs Assessment: post-procedure vital signs reviewed and stable Respiratory status: spontaneous breathing, nonlabored ventilation and respiratory function stable Cardiovascular status: blood pressure returned to baseline and stable Postop Assessment: no apparent nausea or vomiting Anesthetic complications: no   No notable events documented.  Last Vitals:  Vitals:   10/30/23 1230 10/30/23 1257  BP: (!) 106/59 105/61  Pulse: 71 63  Resp: 19 15  Temp: (!) 36.1 C 36.4 C  SpO2: 95% 97%    Last Pain:  Vitals:   10/30/23 1230  TempSrc:   PainSc: 0-No pain                 Felicia Rivera

## 2023-10-30 NOTE — Consult Note (Signed)
 Urology Consult Note   Requesting Attending Physician:  Willette Adriana LABOR, MD Service Providing Consult: Urology   Reason for Consult:  Nephrolithiasis  HPI: Felicia Rivera, hx of bipolar, fibromyalgia, IBS, GERD, migraines, is seen in consultation for reasons noted above at the request of Shahmehdi, Adriana LABOR, MD for evaluation of right flank pain and back spasms with associated nausea/vomiting, but denies any fevers or chills, no dysuria or hematuria.  Afebrile and hemodynamically stable on arrival, labs notable for initial slight leukocytosis that has resolved, stable hgb, Cr 1.4, (baseline difficult to determine but between 1-1.2 likely). UA with nitrites, LE positive, 11-20 WBCs, and rare bacteria, urine culture pending.   Imaging with multiple non-obstructing stones bilaterally and a 4mm distal right UVJ stone, mild pelvic fullness vs extrarenal pelvis but no significant hydronephrosis.   She has no previous history of kidney stones. She does endorse a family history of kidney stones  Interval 10/30/23: AFVSS on room air, continues to void without difficulty, leukocytosis has resolved, cr continues to uptrend 1.66 today from 1.4 yesterday and 1.3 on admission. Ucx with GPCs, further identification pending   Assessment:  53 y.o. female with hx above who presented with right flank pain, and CT that demonstrates multiple non-obstructing stones bilaterally and a 4mm distal R UVJ stone  Concerning for infection given: UA In the ED received: 2g CTX  We discussed the indications for acute intervention including infected obstruction, bilateral ureteral obstruction or unilateral obstruction of solitary kidney as well as other less urgent indications for decompression which would included intractable pain, N/V, and acute renal injury. We also discussed the possible inability to place a ureteral stent in which case, the next intervention recommended would be a percutaneous nephrostomy tube. Given  c/f infected obstruction is present, plan for right ureteral stent placement.   Recommendations: Plan for stent vs USE today 7/2 If patient fevers, or clinically decompensates please page the urology pager for consideration of emergent placement Continue scheduled CTX q24hr for abx Dispo recommendations pending case   Thank you for this consult. Please contact the urology consult pager with any further questions/concerns.   Past Medical History: Past Medical History:  Diagnosis Date   Arthritis    Bipolar 1 disorder (HCC) 02/28/2016   Bipolar disorder (HCC) 02/28/2016   Bursitis    Fibromyalgia 02/28/2016   GERD (gastroesophageal reflux disease) 02/28/2016   IBS (irritable bowel syndrome) 02/28/2016   Migraine 02/28/2016   Periodic limb movement disorder (PLMD)    RLS (restless legs syndrome) 02/28/2016   Ulnar neuropathy at elbow, left 02/28/2016   Vitamin D  deficiency 02/28/2016    Past Surgical History:  Past Surgical History:  Procedure Laterality Date   ABDOMINAL HYSTERECTOMY     dental work  04/16/2017   x2    Medication: Current Facility-Administered Medications  Medication Dose Route Frequency Provider Last Rate Last Admin   acetaminophen  (TYLENOL ) tablet 650 mg  650 mg Oral Q6H PRN Amponsah, Prosper M, MD       Or   acetaminophen  (TYLENOL ) suppository 650 mg  650 mg Rectal Q6H PRN Lou Claretta HERO, MD       acetaminophen  (TYLENOL ) tablet 1,000 mg  1,000 mg Oral Once Finucane, Elizabeth M, DO       buPROPion (WELLBUTRIN XL) 24 hr tablet 150 mg  150 mg Oral Daily Amponsah, Prosper M, MD   150 mg at 10/29/23 1014   cefTRIAXone (ROCEPHIN) 1 g in sodium chloride  0.9 % 100 mL IVPB  1 g Intravenous Q24H Amponsah, Prosper M, MD 200 mL/hr at 10/29/23 2155 1 g at 10/29/23 2155   clonazePAM (KLONOPIN) tablet 1 mg  1 mg Oral BID Amponsah, Prosper M, MD   1 mg at 10/29/23 2155   enoxaparin (LOVENOX) injection 30 mg  30 mg Subcutaneous Q24H Amponsah, Prosper M, MD        fluticasone (FLONASE) 50 MCG/ACT nasal spray 2 spray  2 spray Each Nare Daily Lou Claretta HERO, MD   2 spray at 10/29/23 1016   fluticasone furoate-vilanterol (BREO ELLIPTA) 200-25 MCG/ACT 1 puff  1 puff Inhalation Daily Lou Claretta HERO, MD   1 puff at 10/29/23 9161   hydrALAZINE (APRESOLINE) injection 10 mg  10 mg Intravenous Q4H PRN Amin, Ankit C, MD       HYDROmorphone (DILAUDID) injection 0.5 mg  0.5 mg Intravenous Q6H PRN Amponsah, Prosper M, MD   0.5 mg at 10/30/23 0538   ipratropium (ATROVENT) 0.03 % nasal spray 1 spray  1 spray Nasal BID Lou Claretta HERO, MD   1 spray at 10/29/23 2155   ipratropium-albuterol (DUONEB) 0.5-2.5 (3) MG/3ML nebulizer solution 3 mL  3 mL Nebulization Q6H PRN Lou Claretta HERO, MD       lamoTRIgine (LAMICTAL) tablet 200 mg  200 mg Oral Daily Amponsah, Prosper M, MD   200 mg at 10/29/23 1014   loratadine (CLARITIN) tablet 10 mg  10 mg Oral Daily Amponsah, Prosper M, MD   10 mg at 10/29/23 1014   metoprolol tartrate (LOPRESSOR) injection 5 mg  5 mg Intravenous Q4H PRN Amin, Ankit C, MD       ondansetron  (ZOFRAN ) tablet 4 mg  4 mg Oral Q6H PRN Amponsah, Prosper M, MD       Or   ondansetron  (ZOFRAN ) injection 4 mg  4 mg Intravenous Q6H PRN Amponsah, Prosper M, MD       pantoprazole (PROTONIX) EC tablet 40 mg  40 mg Oral Daily Amponsah, Prosper M, MD   40 mg at 10/29/23 1015   QUEtiapine (SEROQUEL) tablet 300 mg  300 mg Oral QHS Amponsah, Prosper M, MD   300 mg at 10/29/23 2155   senna-docusate (Senokot-S) tablet 1 tablet  1 tablet Oral QHS PRN Amponsah, Prosper M, MD       tamsulosin (FLOMAX) capsule 0.4 mg  0.4 mg Oral Daily Harris, Abigail, PA-C   0.4 mg at 10/29/23 1014   topiramate  (TOPAMAX ) tablet 50 mg  50 mg Oral QHS Amponsah, Prosper M, MD   50 mg at 10/29/23 2156    Allergies: Allergies  Allergen Reactions   Benztropine  Other (See Comments)    Dehydration and cognitive impairment    Other reaction(s): Unknown  Other Reaction(s): Not  available, shaky  Cogentin    Cefdinir Nausea Only and Other (See Comments)    Other reaction(s): Unknown   Clarithromycin Other (See Comments)    Other reaction(s): Unknown   Erythromycin Nausea Only   Hydrocodone Nausea Only   Nsaids Other (See Comments)    Other reaction(s): Unknown   Pregabalin Other (See Comments)    Jittery  Other reaction(s): Unknown    Social History: Social History   Tobacco Use   Smoking status: Never    Passive exposure: Past   Smokeless tobacco: Never  Vaping Use   Vaping status: Never Used  Substance Use Topics   Alcohol use: Yes    Comment: Occassionally   Drug use: Never    Family History Family History  Problem  Relation Age of Onset   Kidney failure Mother        Stage 3   Diabetes Mother    Glaucoma Mother    Heart Problems Mother        pacemaker, defibrilator    Hypertension Mother    Congestive Heart Failure Mother    Rheum arthritis Mother        possibly   COPD Mother    Emphysema Mother    Hypertension Father    High Cholesterol Father    Heart Problems Father    Leukemia Father    Acute myelogenous leukemia Father     Review of Systems 10 systems were reviewed and are negative except as noted specifically in the HPI.  Objective   Vital signs in last 24 hours: BP (!) 120/58 (BP Location: Right Arm)   Pulse 79   Temp 98.4 F (36.9 C) (Oral)   Resp 14   Ht 5' 4 (1.626 m)   Wt 43 kg   SpO2 97%   BMI 16.27 kg/m   Physical Exam General: NAD, A&O, resting, appropriate HEENT: [place]/AT, EOMI, MMM Pulmonary: Normal work of breathing Cardiovascular: HDS, adequate peripheral perfusion Abdomen: Soft, NTTP, nondistended. GU: Voiding spontaneously, no CVA tenderness Extremities: warm and well perfused Neuro: Appropriate, no focal neurological deficits  Most Recent Labs: Lab Results  Component Value Date   WBC 6.4 10/30/2023   HGB 10.6 (L) 10/30/2023   HCT 32.5 (L) 10/30/2023   PLT 114 (L) 10/30/2023    Lab  Results  Component Value Date   NA 139 10/30/2023   K 3.1 (L) 10/30/2023   CL 113 (H) 10/30/2023   CO2 21 (L) 10/30/2023   BUN 17 10/30/2023   CREATININE 1.66 (H) 10/30/2023   CALCIUM 8.0 (L) 10/30/2023   MG 1.7 10/30/2023   PHOS 3.3 10/30/2023    No results found for: INR, APTT   Urine Culture: @[id number] (laburin,org,[id number])@   IMAGING: CT Renal Stone Study Result Date: 10/28/2023 CLINICAL DATA:  Right-sided pelvic pain. EXAM: CT ABDOMEN AND PELVIS WITHOUT CONTRAST TECHNIQUE: Multidetector CT imaging of the abdomen and pelvis was performed following the standard protocol without IV contrast. RADIATION DOSE REDUCTION: This exam was performed according to the departmental dose-optimization program which includes automated exposure control, adjustment of the mA and/or kV according to patient size and/or use of iterative reconstruction technique. COMPARISON:  None Available. FINDINGS: Lower chest: No acute abnormality. Hepatobiliary: No focal liver abnormality is seen. No gallstones, gallbladder wall thickening, or biliary dilatation. Pancreas: Unremarkable. No pancreatic ductal dilatation or surrounding inflammatory changes. Spleen: Normal in size without focal abnormality. Adrenals/Urinary Tract: Adrenal glands are unremarkable. Kidneys are normal in size without focal lesions. 3 mm, 5 mm and 11 mm nonobstructing renal calculi are seen within the right kidney. A 3 mm nonobstructing left renal calculus is also seen. A 4 mm obstructing renal calculus is seen within the distal right ureter, near the right UVJ. The urinary bladder is poorly distended and subsequently limited in evaluation. Stomach/Bowel: Stomach is within normal limits. Appendix appears normal. No evidence of bowel wall thickening, distention, or inflammatory changes. Vascular/Lymphatic: No significant vascular findings are present. No enlarged abdominal or pelvic lymph nodes. Reproductive: Status post hysterectomy. No  adnexal masses. Other: No abdominal wall hernia or abnormality. No abdominopelvic ascites. Musculoskeletal: No acute or significant osseous findings. IMPRESSION: 1. 4 mm obstructing renal calculus within the distal right ureter, near the right UVJ. 2. Bilateral nonobstructing renal calculi. Electronically Signed  By: Suzen Dials M.D.   On: 10/28/2023 21:52    ------

## 2023-10-30 NOTE — Progress Notes (Signed)
 PROGRESS NOTE    Felicia Rivera  FMW:[id number] DOB: 03/20/1971 DOA: 10/28/2023 PCP: Sun, Vyvyan, MD    Brief Narrative:  53 year old with history of bipolar 1, fibromyalgia, OA, asthma, IBS, GERD, RLS, migraine comes to the ED for evaluation of back pain and flank pain.  CT renal stone shows 4 mm obstructing renal stone started on IV antibiotics, urology team consulted.  Planning for cystoscopy with stent placement on 7/2.   Subjective :  The patient was seen and examined this morning, stable no acute distress,  n.p.o. For cystoscopy and possible stenting-dynamically stable no acute distress no issues overnight   Assessment & Plan:  Principal Problem:   Right ureteral stone Active Problems:   Urinary tract infection with hematuria   Severe protein-calorie malnutrition (HCC)   Urinary tract infection Obstructive nephropathy secondary to renal stone, 4 mm - Currently on IV Rocephin, -Urine culture: Positive for staph -pansensitive with exception of doxycycline and Bactrim - Urology following closely -- 10/30/2023 S/P cystoscopy, right ureteroscopy removal of right distal fragment stones, and stent placement - Continue IVF, as needed analgesics   Bipolar 1 Fibromyalgia/insomnia -Somewhat withdrawn otherwise interactive, stable mood - On Klonopin, bupropion and Lamictal - Follows outpatient rheumatology - Continue Topamax   Asthma - As needed bronchodilator - Stable  Allergic rhinitis - Flonase, Atrovent  GERD - PPI  Severe protein calorie malnutrition Body mass index is 16.27 kg/m.  -Encourage p.o. intake   Diet Orders (From admission, onward)     Start     Ordered   10/30/23 1258  Diet regular Room service appropriate? Yes; Fluid consistency: Thin  Diet effective now       Question Answer Comment  Room service appropriate? Yes   Fluid consistency: Thin      10/30/23 1257            Objective: Vitals:   10/30/23 1200 10/30/23 1215 10/30/23 1230  10/30/23 1257  BP: 112/62 106/63 (!) 106/59 105/61  Pulse: 78 71 71 63  Resp: 18 12 19 15   Temp:   (!) 97 F (36.1 C) 97.6 F (36.4 C)  TempSrc:      SpO2: 100% 96% 95% 97%  Weight:      Height:        Intake/Output Summary (Last 24 hours) at 10/30/2023 1503 Last data filed at 10/30/2023 1127 Gross per 24 hour  Intake 2210 ml  Output 300 ml  Net 1910 ml   Filed Weights   10/28/23 1655 10/29/23 0100 10/30/23 1002  Weight: 40.2 kg 43 kg 43 kg    Scheduled Meds:  buPROPion  150 mg Oral Daily   clonazePAM  1 mg Oral BID   enoxaparin (LOVENOX) injection  30 mg Subcutaneous Q24H   fluticasone  2 spray Each Nare Daily   fluticasone furoate-vilanterol  1 puff Inhalation Daily   ipratropium  1 spray Nasal BID   lamoTRIgine  200 mg Oral Daily   loratadine  10 mg Oral Daily   pantoprazole  40 mg Oral Daily   QUEtiapine  300 mg Oral QHS   tamsulosin  0.4 mg Oral Daily   topiramate   50 mg Oral QHS   Continuous Infusions:  cefTRIAXone (ROCEPHIN)  IV 1 g (10/29/23 2155)    Nutritional status     Body mass index is 16.27 kg/m.  Data Reviewed:   CBC: Recent Labs  Lab 10/27/23 1826 10/28/23 1703 10/29/23 0333 10/30/23 0335  WBC 9.0 12.8* 9.5 6.4  NEUTROABS  --  11.3*  --   --   HGB 14.2 14.8 11.9* 10.6*  HCT 43.5 44.3 36.9 32.5*  MCV 95.6 96.1 99.7 99.1  PLT 201 207 135* 114*   Basic Metabolic Panel: Recent Labs  Lab 10/27/23 1826 10/28/23 1703 10/29/23 0333 10/30/23 0335  NA 137 140 141 139  K 3.3* 4.6 3.5 3.1*  CL 105 105 112* 113*  CO2 23 25 19* 21*  GLUCOSE 119* 151* 93 91  BUN 13 18 21* 17  CREATININE 1.28* 1.36* 1.40* 1.66*  CALCIUM 9.6 10.2 8.8* 8.0*  MG  --  2.3  --  1.7  PHOS  --   --   --  3.3   GFR: Estimated Creatinine Clearance: 26.9 mL/min (A) (by C-G formula based on SCr of 1.66 mg/dL (H)). Liver Function Tests: Recent Labs  Lab 10/28/23 1703 10/29/23 0333  AST 25 14*  ALT 9 7  ALKPHOS 50 38  BILITOT 1.1 0.6  PROT 7.5 5.8*   ALBUMIN 4.2 3.2*     Recent Results (from the past 240 hours)  Urine Culture     Status: Abnormal   Collection Time: 10/28/23  9:09 PM   Specimen: Urine, Clean Catch  Result Value Ref Range Status   Specimen Description   Final    URINE, CLEAN CATCH Performed at [facility], [address] [place], [place] [address]    Special Requests   Final    NONE Performed at [facility], [address] [place], [place] [address]    Culture >=100,000 COLONIES/mL STAPHYLOCOCCUS EPIDERMIDIS (A)  Final   Report Status 10/30/2023 FINAL  Final   Organism ID, Bacteria STAPHYLOCOCCUS EPIDERMIDIS (A)  Final      Susceptibility   Staphylococcus epidermidis - MIC*    CIPROFLOXACIN <=0.5 SENSITIVE Sensitive     GENTAMICIN <=0.5 SENSITIVE Sensitive     NITROFURANTOIN <=16 SENSITIVE Sensitive     OXACILLIN >=4 RESISTANT Resistant     TETRACYCLINE <=1 SENSITIVE Sensitive     VANCOMYCIN 1 SENSITIVE Sensitive     TRIMETH/SULFA 160 RESISTANT Resistant     RIFAMPIN <=0.5 SENSITIVE Sensitive     Inducible Clindamycin NEGATIVE Sensitive     * >=100,000 COLONIES/mL STAPHYLOCOCCUS EPIDERMIDIS  Surgical pcr screen     Status: Abnormal   Collection Time: 10/30/23  5:45 AM   Specimen: Nasal Mucosa; Nasal Swab  Result Value Ref Range Status   MRSA, PCR NEGATIVE NEGATIVE Final   Staphylococcus aureus POSITIVE (A) NEGATIVE Final    Comment: (NOTE) The Xpert SA Assay (FDA approved for NASAL specimens in patients 42 years of age and older), is one component of a comprehensive surveillance program. It is not intended to diagnose infection nor to guide or monitor treatment. Performed at [facility], [address], [place], [place] [address]          Radiology Studies: DG C-Arm 1-60 Min-No Report Result Date: 10/30/2023 Fluoroscopy was utilized by the requesting physician.  No radiographic interpretation.   CT Renal Stone Study Result Date:  10/28/2023 CLINICAL DATA:  Right-sided pelvic pain. EXAM: CT ABDOMEN AND PELVIS WITHOUT CONTRAST TECHNIQUE: Multidetector CT imaging of the abdomen and pelvis was performed following the standard protocol without IV contrast. RADIATION DOSE REDUCTION: This exam was performed according to the departmental dose-optimization program which includes automated exposure control, adjustment of the mA and/or kV according to patient size and/or use of iterative reconstruction technique. COMPARISON:  None Available. FINDINGS: Lower chest: No  acute abnormality. Hepatobiliary: No focal liver abnormality is seen. No gallstones, gallbladder wall thickening, or biliary dilatation. Pancreas: Unremarkable. No pancreatic ductal dilatation or surrounding inflammatory changes. Spleen: Normal in size without focal abnormality. Adrenals/Urinary Tract: Adrenal glands are unremarkable. Kidneys are normal in size without focal lesions. 3 mm, 5 mm and 11 mm nonobstructing renal calculi are seen within the right kidney. A 3 mm nonobstructing left renal calculus is also seen. A 4 mm obstructing renal calculus is seen within the distal right ureter, near the right UVJ. The urinary bladder is poorly distended and subsequently limited in evaluation. Stomach/Bowel: Stomach is within normal limits. Appendix appears normal. No evidence of bowel wall thickening, distention, or inflammatory changes. Vascular/Lymphatic: No significant vascular findings are present. No enlarged abdominal or pelvic lymph nodes. Reproductive: Status post hysterectomy. No adnexal masses. Other: No abdominal wall hernia or abnormality. No abdominopelvic ascites. Musculoskeletal: No acute or significant osseous findings. IMPRESSION: 1. 4 mm obstructing renal calculus within the distal right ureter, near the right UVJ. 2. Bilateral nonobstructing renal calculi. Electronically Signed   By: Suzen Dials M.D.   On: 10/28/2023 21:52       LOS: 2 days   Time spent= 55  mins  Adriana DELENA Grams, MD [facility]  If 7PM-7AM, please contact night-coverage  10/30/2023, 3:03 PM

## 2023-10-30 NOTE — Transfer of Care (Signed)
 Immediate Anesthesia Transfer of Care Note  Patient: Felicia Rivera  Procedure(s) Performed: CYSTOSCOPY/URETEROSCOPY/HOLMIUM LASER/STENT PLACEMENT (Right: Pelvis)  Patient Location: PACU  Anesthesia Type:General  Level of Consciousness: drowsy and patient cooperative  Airway & Oxygen Therapy: Patient Spontanous Breathing and Patient connected to face mask oxygen  Post-op Assessment: Report given to RN and Post -op Vital signs reviewed and stable  Post vital signs: Reviewed and stable  Last Vitals:  Vitals Value Taken Time  BP 128/75 10/30/23 11:36  Temp    Pulse 97 10/30/23 11:39  Resp 10 10/30/23 11:39  SpO2 100 % 10/30/23 11:39  Vitals shown include unfiled device data.  Last Pain:  Vitals:   10/30/23 1002  TempSrc:   PainSc: 0-No pain         Complications: No notable events documented.

## 2023-10-30 NOTE — Discharge Instructions (Signed)
 [facility] [phone] Post Ureteroscopy With or Without Stent Instructions Remove stent by pulling string Monday 7/7  Definitions:  Ureter: The duct that transports urine from the kidney to the bladder. Stent:   A plastic hollow tube that is placed into the ureter, from the kidney to the bladder to prevent the ureter from swelling shut.  GENERAL INSTRUCTIONS:  Despite the fact that no skin incisions were used, the area around the ureter and bladder is raw and irritated. The stent is a foreign body which will further irritate the bladder wall. This irritation is manifested by increased frequency of urination, both day and night, and by an increase in the urge to urinate. In some, the urge to urinate is present almost always. Sometimes the urge is strong enough that you may not be able to stop yourself from urinating. The only real cure is to remove the stent and then give time for the bladder wall to heal which can't be done until the danger of the ureter swelling shut has passed, which varies.  You may see some blood in your urine while the stent is in place and a few days afterwards. Do not be alarmed, even if the urine was clear for a while. Get off your feet and drink lots of fluids until clearing occurs. If you start to pass clots or don't improve, call us .  DIET: You may return to your normal diet immediately. Because of the raw surface of your bladder, alcohol, spicy foods, acid type foods and drinks with caffeine may cause irritation or frequency and should be used in moderation. To keep your urine flowing freely and to avoid constipation, drink plenty of fluids during the day ( 8-10 glasses ). Tip: Avoid cranberry juice because it is very acidic.  ACTIVITY: Your physical activity doesn't need to be restricted. However, if you are very active, you may see some blood in your urine. We suggest that you reduce your activity under these circumstances until the bleeding has  stopped.  BOWELS: It is important to keep your bowels regular during the postoperative period. Straining with bowel movements can cause bleeding. A bowel movement every other day is reasonable. Use a mild laxative if needed, such as Milk of Magnesia 2-3 tablespoons, or 2 Dulcolax tablets. Call if you continue to have problems. If you have been taking narcotics for pain, before, during or after your surgery, you may be constipated. Take a laxative if necessary.   MEDICATION: You should resume your pre-surgery medications unless told not to. In addition you will often be given an antibiotic to prevent infection and likely several as needed medications for stent related discomfort. These should be taken as prescribed until the bottles are finished unless you are having an unusual reaction to one of the drugs.  PROBLEMS YOU SHOULD REPORT TO US : Fevers over 100.5 Fahrenheit. Heavy bleeding, or clots ( See above notes about blood in urine ). Inability to urinate. Drug reactions ( hives, rash, nausea, vomiting, diarrhea ). Severe burning or pain with urination that is not improving.

## 2023-10-30 NOTE — Op Note (Signed)
 Operative Note  Preoperative diagnosis:  1.  Right ureteral stone 2. UTI  Postoperative diagnosis: 1.  same  Procedure(s): 1.  Right ureteroscopy with laser lithotripsy and basket extraction of stones 2.  Right retrograde pyelogram 3. Right ureteral stent placement 6x24 cm 4. Fluoroscopy with intraoperative interpretation  Surgeon: Herlene Foot, MD  Assistants:  None  Anesthesia:  General  Complications:  None  EBL:  Minimal  Specimens: 1. stones for stone analysis (to be done at [facility])  Drains/Catheters: 1.  Right 6Fr x 24cm ureteral stent with tether string  Intraoperative findings:   Cystoscopy demonstrated unremarkable bladder Right Ureteroscopy demonstrated stone in distal right ureter; fragmented and removed Successful stent placement.  R RPG - stone visible on initial XR in distal R ureter. Another stone visible on XR in right lower pole. No filling defects with RPG, mild hydronephrosis  Description of procedure: After informed consent was obtained from the patient, the patient was identified and taken to the operating room and placed in the supine position.  General anesthesia was administered as well as perioperative IV antibiotics.  At the beginning of the case, a time-out was performed to properly identify the patient, the surgery to be performed, and the surgical site.  Sequential compression devices were applied to the lower extremities at the beginning of the case for DVT prophylaxis.  The patient was then placed in the dorsal lithotomy supine position, prepped and draped in sterile fashion.  We then passed the 21-French rigid cystoscope through the urethra and into the bladder under vision without any difficulty.  A systematic evaluation of the bladder revealed no evidence of any suspicious bladder lesions.  Ureteral orifices were in normal position.    we then passed a 0.038 sensor wire up to the level of the renal pelvis.  The semi rigid was  carefully passed into the distal ureter.  The stone was identified, and fragmented with the holmium laser. The fragments were removed with the zero tip basket under direct visualization.  RPG was done through the scope with findings as above.  We then used the Glidewire and passed up a 6-French, 24 cm double-pigtail ureteral stent up ureter under fluoro, making sure that the proximal and distal ends coiled within the kidney and bladder respectively.  Note that we left a short  long tether string attached to the distal end of the ureteral stent and it exited the urethral meatus.    The patient tolerated the procedure well and there was no complication. Patient was awoken from anesthesia and taken to the recovery room in stable condition. I was present and scrubbed for the entirety of the case.  Plan:  Patient will return to floor with instructions to remove the stent by pulling on the string in 5 days   G. Herlene Foot MD [facility]  Pager: [phone]

## 2023-10-30 NOTE — Anesthesia Procedure Notes (Signed)
 Procedure Name: LMA Insertion Date/Time: 10/30/2023 10:37 AM  Performed by: Eulalie Speights, CRNAPre-anesthesia Checklist: Patient identified, Emergency Drugs available, Suction available and Patient being monitored Patient Re-evaluated:Patient Re-evaluated prior to induction Oxygen Delivery Method: Circle system utilized Preoxygenation: Pre-oxygenation with 100% oxygen Induction Type: IV induction Ventilation: Mask ventilation without difficulty LMA: LMA inserted LMA Size: 4.0 Tube type: Oral Number of attempts: 1 Airway Equipment and Method: Stylet and Oral airway Placement Confirmation: positive ETCO2 and breath sounds checked- equal and bilateral Tube secured with: Tape Dental Injury: Teeth and Oropharynx as per pre-operative assessment

## 2023-10-30 NOTE — Interval H&P Note (Signed)
 History and Physical Interval Note:  10/30/2023 8:32 AM  Felicia Rivera  has presented today for surgery, with the diagnosis of Right ureteral stone.  The various methods of treatment have been discussed with the patient and family. After consideration of risks, benefits and other options for treatment, the patient has consented to  Procedure(s): CYSTOSCOPY/URETEROSCOPY/HOLMIUM LASER/STENT PLACEMENT (Right) as a surgical intervention.  The patient's history has been reviewed, patient examined, no change in status, stable for surgery.  I have reviewed the patient's chart and labs.  Questions were answered to the patient's satisfaction.     [name]

## 2023-10-30 NOTE — Plan of Care (Signed)
   Problem: Education: Goal: Knowledge of General Education information will improve Description Including pain rating scale, medication(s)/side effects and non-pharmacologic comfort measures Outcome: Progressing   Problem: Health Behavior/Discharge Planning: Goal: Ability to manage health-related needs will improve Outcome: Progressing

## 2023-10-31 ENCOUNTER — Encounter (HOSPITAL_COMMUNITY): Payer: Self-pay | Admitting: Urology

## 2023-10-31 DIAGNOSIS — N201 Calculus of ureter: Secondary | ICD-10-CM | POA: Diagnosis not present

## 2023-10-31 LAB — CBC
HCT: 33.4 % — ABNORMAL LOW (ref 36.0–46.0)
Hemoglobin: 11.1 g/dL — ABNORMAL LOW (ref 12.0–15.0)
MCH: 31.8 pg (ref 26.0–34.0)
MCHC: 33.2 g/dL (ref 30.0–36.0)
MCV: 95.7 fL (ref 80.0–100.0)
Platelets: 123 10*3/uL — ABNORMAL LOW (ref 150–400)
RBC: 3.49 MIL/uL — ABNORMAL LOW (ref 3.87–5.11)
RDW: 12.1 % (ref 11.5–15.5)
WBC: 6.5 10*3/uL (ref 4.0–10.5)
nRBC: 0 % (ref 0.0–0.2)

## 2023-10-31 LAB — BASIC METABOLIC PANEL WITH GFR
Anion gap: 5 (ref 5–15)
BUN: 15 mg/dL (ref 6–20)
CO2: 20 mmol/L — ABNORMAL LOW (ref 22–32)
Calcium: 8.7 mg/dL — ABNORMAL LOW (ref 8.9–10.3)
Chloride: 113 mmol/L — ABNORMAL HIGH (ref 98–111)
Creatinine, Ser: 1.18 mg/dL — ABNORMAL HIGH (ref 0.44–1.00)
GFR, Estimated: 56 mL/min — ABNORMAL LOW (ref >60)
Glucose, Bld: 174 mg/dL — ABNORMAL HIGH (ref 70–99)
Potassium: 3.8 mmol/L (ref 3.5–5.1)
Sodium: 138 mmol/L (ref 135–145)

## 2023-10-31 LAB — MAGNESIUM: Magnesium: 2 mg/dL (ref 1.7–2.4)

## 2023-10-31 MED ORDER — ENSURE PLUS HIGH PROTEIN PO LIQD
237.0000 mL | Freq: Two times a day (BID) | ORAL | Status: DC
  Administered 2023-10-31 – 2023-11-01 (×3): 237 mL via ORAL

## 2023-10-31 MED ORDER — SENNA 8.6 MG PO TABS
1.0000 | ORAL_TABLET | Freq: Every day | ORAL | Status: DC
  Administered 2023-10-31: 8.6 mg via ORAL
  Filled 2023-10-31: qty 1

## 2023-10-31 MED ORDER — POLYETHYLENE GLYCOL 3350 17 G PO PACK
17.0000 g | PACK | Freq: Two times a day (BID) | ORAL | Status: DC
  Administered 2023-10-31 (×2): 17 g via ORAL
  Filled 2023-10-31 (×2): qty 1

## 2023-10-31 MED ORDER — FLORANEX PO PACK
1.0000 g | PACK | Freq: Three times a day (TID) | ORAL | Status: DC
  Administered 2023-10-31 – 2023-11-01 (×4): 1 g via ORAL
  Filled 2023-10-31 (×5): qty 1

## 2023-10-31 MED ORDER — POLYETHYLENE GLYCOL 3350 17 G PO PACK: 17.0000 g | PACK | Freq: Every day | ORAL | Status: DC

## 2023-10-31 NOTE — Progress Notes (Signed)
 On-Call NP Versie) made aware of patient resting pulse brady 40-50's and easy to arouse. Patient alert & oriented with no complaints noted at this time. Continuous pulse ox in place and will continue to monitor the patient and make incoming nurse aware.

## 2023-10-31 NOTE — Progress Notes (Signed)
 PROGRESS NOTE    Felicia Rivera  FMW:[id number] DOB: [birth year] DOA: 10/28/2023 PCP: Sun, Vyvyan, MD    Brief Narrative:  53 year old with history of bipolar 1, fibromyalgia, OA, asthma, IBS, GERD, RLS, migraine comes to the ED for evaluation of back pain and flank pain.  CT renal stone shows 4 mm obstructing renal stone started on IV antibiotics, urology team consulted.  Planning for cystoscopy with stent placement on 7/2.   Subjective :  Patient was seen and examined this morning, stable, still complaining of bilateral flank pain but much improved, Also complaining of constipation Status post cystoscopy with stent placement and stone manipulation on 10/30/2023-tolerated procedure well Still having some mild hematuria    Assessment & Plan:  Principal Problem:   Right ureteral stone Active Problems:   Urinary tract infection with hematuria   Severe protein-calorie malnutrition (HCC)   Urinary tract infection Obstructive nephropathy secondary to renal stone, 4 mm - Currently on IV Rocephin, -Urine culture: Positive for staph -pansensitive with exception of doxycycline and Bactrim - Urology following closely -- 10/30/2023 S/P cystoscopy, right ureteroscopy removal of right distal fragment stones, and stent placement -and laser lithotripsy - Tolerated procedure well, still complaining some mild discomfort, continue as needed analgesics  -Still having dysuria-Azo medication provided  Bipolar 1 Fibromyalgia/insomnia -Somewhat withdrawn otherwise interactive, stable mood - On Klonopin, bupropion and Lamictal - Follows outpatient rheumatology - Continue Topamax   Asthma - As needed bronchodilator - Stable  Allergic rhinitis - Flonase, Atrovent  GERD - PPI  Severe protein calorie malnutrition Body mass index is 16.27 kg/m.  -Encourage p.o. intake  Constipation -Per patient nursing staff no bowel movements x 2 days - Bowel regiment MiraLAX and Senokot  added   Disposition: Medicine inpatient,  From home -anticipating discharge 11/01/2023   DVT prophylaxis  CDs   objective: Vitals:   10/30/23 2047 10/31/23 0254 10/31/23 0557 10/31/23 1000  BP: 116/68 128/72 138/71 (!) 104/57  Pulse: 62 (!) 48 61 61  Resp: 14 14 16 16   Temp: 97.6 F (36.4 C) 97.6 F (36.4 C) (!) 97.5 F (36.4 C) 97.6 F (36.4 C)  TempSrc:      SpO2: 95% 98% 97% 97%  Weight:      Height:             General:  AAO x 3,  cooperative, no distress;   HEENT:  Normocephalic, PERRL, otherwise with in Normal limits   Neuro:  CNII-XII intact. , normal motor and sensation, reflexes intact   Lungs:   Clear to auscultation BL, Respirations unlabored,  No wheezes / crackles  Cardio:    S1/S2, RRR, No murmure, No Rubs or Gallops   Abdomen:  soft, non-tender, bowel sounds active all four quadrants, no guarding or peritoneal signs.  Muscular  skeletal:  Mild bilateral CVA tenderness, Limited exam -global generalized weaknesses - in bed, able to move all 4 extremities,   2+ pulses,  symmetric, No pitting edema  Skin:  Dry, warm to touch, negative for any Rashes,  Wounds: Please see nursing documentation        Intake/Output Summary (Last 24 hours) at 10/31/2023 1134 Last data filed at 10/31/2023 0600 Gross per 24 hour  Intake 740 ml  Output --  Net 740 ml   Filed Weights   10/28/23 1655 10/29/23 0100 10/30/23 1002  Weight: 40.2 kg 43 kg 43 kg    Scheduled Meds:  buPROPion  150 mg Oral Daily   Chlorhexidine Gluconate Cloth  6  each Topical Daily   clonazePAM  1 mg Oral BID   enoxaparin (LOVENOX) injection  30 mg Subcutaneous Q24H   feeding supplement  237 mL Oral BID BM   fluticasone  2 spray Each Nare Daily   fluticasone furoate-vilanterol  1 puff Inhalation Daily   ipratropium  1 spray Nasal BID   lactobacillus  1 g Oral TID WC   lamoTRIgine  200 mg Oral Daily   loratadine  10 mg Oral Daily   mupirocin ointment  1 Application Nasal BID    pantoprazole  40 mg Oral Daily   polyethylene glycol  17 g Oral BID   QUEtiapine  300 mg Oral QHS   senna  1 tablet Oral Daily   tamsulosin  0.4 mg Oral Daily   topiramate   50 mg Oral QHS   Continuous Infusions:  cefTRIAXone (ROCEPHIN)  IV 1 g (10/30/23 2300)    Nutritional status     Body mass index is 16.27 kg/m.  Data Reviewed:   CBC: Recent Labs  Lab 10/27/23 1826 10/28/23 1703 10/29/23 0333 10/30/23 0335 10/31/23 0327  WBC 9.0 12.8* 9.5 6.4 6.5  NEUTROABS  --  11.3*  --   --   --   HGB 14.2 14.8 11.9* 10.6* 11.1*  HCT 43.5 44.3 36.9 32.5* 33.4*  MCV 95.6 96.1 99.7 99.1 95.7  PLT 201 207 135* 114* 123*   Basic Metabolic Panel: Recent Labs  Lab 10/27/23 1826 10/28/23 1703 10/29/23 0333 10/30/23 0335 10/31/23 0327  NA 137 140 141 139 138  K 3.3* 4.6 3.5 3.1* 3.8  CL 105 105 112* 113* 113*  CO2 23 25 19* 21* 20*  GLUCOSE 119* 151* 93 91 174*  BUN 13 18 21* 17 15  CREATININE 1.28* 1.36* 1.40* 1.66* 1.18*  CALCIUM 9.6 10.2 8.8* 8.0* 8.7*  MG  --  2.3  --  1.7 2.0  PHOS  --   --   --  3.3  --    GFR: Estimated Creatinine Clearance: 37.9 mL/min (A) (by C-G formula based on SCr of 1.18 mg/dL (H)). Liver Function Tests: Recent Labs  Lab 10/28/23 1703 10/29/23 0333  AST 25 14*  ALT 9 7  ALKPHOS 50 38  BILITOT 1.1 0.6  PROT 7.5 5.8*  ALBUMIN 4.2 3.2*     Recent Results (from the past 240 hours)  Urine Culture     Status: Abnormal   Collection Time: 10/28/23  9:09 PM   Specimen: Urine, Clean Catch  Result Value Ref Range Status   Specimen Description   Final    URINE, CLEAN CATCH Performed at [facility], [address] [place], [place] [address]    Special Requests   Final    NONE Performed at [facility], [address], [place], [place] [address]    Culture >=100,000 COLONIES/mL STAPHYLOCOCCUS EPIDERMIDIS (A)  Final   Report Status 10/30/2023 FINAL  Final   Organism ID, Bacteria STAPHYLOCOCCUS  EPIDERMIDIS (A)  Final      Susceptibility   Staphylococcus epidermidis - MIC*    CIPROFLOXACIN <=0.5 SENSITIVE Sensitive     GENTAMICIN <=0.5 SENSITIVE Sensitive     NITROFURANTOIN <=16 SENSITIVE Sensitive     OXACILLIN >=4 RESISTANT Resistant     TETRACYCLINE <=1 SENSITIVE Sensitive     VANCOMYCIN 1 SENSITIVE Sensitive     TRIMETH/SULFA 160 RESISTANT Resistant     RIFAMPIN <=0.5 SENSITIVE Sensitive     Inducible Clindamycin NEGATIVE Sensitive     * >=  100,000 COLONIES/mL STAPHYLOCOCCUS EPIDERMIDIS  Surgical pcr screen     Status: Abnormal   Collection Time: 10/30/23  5:45 AM   Specimen: Nasal Mucosa; Nasal Swab  Result Value Ref Range Status   MRSA, PCR NEGATIVE NEGATIVE Final   Staphylococcus aureus POSITIVE (A) NEGATIVE Final    Comment: (NOTE) The Xpert SA Assay (FDA approved for NASAL specimens in patients 74 years of age and older), is one component of a comprehensive surveillance program. It is not intended to diagnose infection nor to guide or monitor treatment. Performed at [facility], [address], [place], [place] [address]          Radiology Studies: DG C-Arm 1-60 Min-No Report Result Date: 10/30/2023 Fluoroscopy was utilized by the requesting physician.  No radiographic interpretation.       LOS: 3 days   Time spent= 55 mins  Adriana DELENA Grams, MD [facility]  If 7PM-7AM, please contact night-coverage  10/31/2023, 11:34 AM

## 2023-10-31 NOTE — Progress Notes (Signed)
   1 Day Post-Op Subjective: First time meeting patient.  She was resting on rounds but was easily awoken.  Bradycardic overnight.  We reviewed the case and plan.  She is experiencing expected burning with urination and constipation.  She reports a history of IBD-C.  Reviewed the plan for removal of tethered stent in 5 days.  She expressed understanding.   Objective: Vital signs in last 24 hours: Temp:  [96.8 F (36 C)-97.6 F (36.4 C)] 97.6 F (36.4 C) (07/03 1000) Pulse Rate:  [48-103] 61 (07/03 1000) Resp:  [12-19] 16 (07/03 1000) BP: (104-138)/(57-75) 104/57 (07/03 1000) SpO2:  [94 %-100 %] 97 % (07/03 1000)  Assessment/Plan: # Right ureteral stone #AKI Renal function continues to improve, nearly at baseline Feels as if she is being ripped in half with urination. Azo provided.  Laser lithotripsy and basket extraction with Dr. Lovie on 10/30/2023.  Tethered stent to be removed in 5 days. Patient is worried about her fibromyalgia and IBD-C.  She would like to make sure she has a bowel movement before she leaves and I will provide MiraLAX. Okay to discharge from urologic perspective  Intake/Output from previous day: 07/02 0701 - 07/03 0700 In: 1640 [P.O.:600; I.V.:900; IV Piggyback:100] Out: 0   Intake/Output this shift: No intake/output data recorded.  Physical Exam:  General: Alert and oriented CV: No cyanosis Lungs: equal chest rise Abdomen: Soft, NTND, no rebound or guarding Gu: tethered ureteral stent  Lab Results: Recent Labs    10/29/23 0333 10/30/23 0335 10/31/23 0327  HGB 11.9* 10.6* 11.1*  HCT 36.9 32.5* 33.4*   BMET Recent Labs    10/30/23 0335 10/31/23 0327  NA 139 138  K 3.1* 3.8  CL 113* 113*  CO2 21* 20*  GLUCOSE 91 174*  BUN 17 15  CREATININE 1.66* 1.18*  CALCIUM 8.0* 8.7*  HGB 10.6* 11.1*  WBC 6.4 6.5     Studies/Results: DG C-Arm 1-60 Min-No Report Result Date: 10/30/2023 Fluoroscopy was utilized by the requesting physician.  No  radiographic interpretation.      LOS: 3 days   Ole Bourdon, NP [facility] Pager: [phone]  10/31/2023, 10:04 AM

## 2023-10-31 NOTE — Plan of Care (Signed)
   Problem: Activity: Goal: Risk for activity intolerance will decrease Outcome: Progressing   Problem: Nutrition: Goal: Adequate nutrition will be maintained Outcome: Progressing   Problem: Coping: Goal: Level of anxiety will decrease Outcome: Progressing

## 2023-11-01 DIAGNOSIS — N201 Calculus of ureter: Secondary | ICD-10-CM | POA: Diagnosis not present

## 2023-11-01 LAB — BASIC METABOLIC PANEL WITH GFR
Anion gap: 11 (ref 5–15)
BUN: 19 mg/dL (ref 6–20)
CO2: 22 mmol/L (ref 22–32)
Calcium: 8.2 mg/dL — ABNORMAL LOW (ref 8.9–10.3)
Chloride: 109 mmol/L (ref 98–111)
Creatinine, Ser: 1.22 mg/dL — ABNORMAL HIGH (ref 0.44–1.00)
GFR, Estimated: 53 mL/min — ABNORMAL LOW (ref >60)
Glucose, Bld: 138 mg/dL — ABNORMAL HIGH (ref 70–99)
Potassium: 3.3 mmol/L — ABNORMAL LOW (ref 3.5–5.1)
Sodium: 142 mmol/L (ref 135–145)

## 2023-11-01 LAB — CBC
HCT: 33.4 % — ABNORMAL LOW (ref 36.0–46.0)
Hemoglobin: 10.9 g/dL — ABNORMAL LOW (ref 12.0–15.0)
MCH: 31.7 pg (ref 26.0–34.0)
MCHC: 32.6 g/dL (ref 30.0–36.0)
MCV: 97.1 fL (ref 80.0–100.0)
Platelets: 145 K/uL — ABNORMAL LOW (ref 150–400)
RBC: 3.44 MIL/uL — ABNORMAL LOW (ref 3.87–5.11)
RDW: 12.3 % (ref 11.5–15.5)
WBC: 7.5 K/uL (ref 4.0–10.5)
nRBC: 0 % (ref 0.0–0.2)

## 2023-11-01 LAB — MAGNESIUM: Magnesium: 1.8 mg/dL (ref 1.7–2.4)

## 2023-11-01 MED ORDER — TAMSULOSIN HCL 0.4 MG PO CAPS: 0.4000 mg | ORAL_CAPSULE | Freq: Every day | ORAL | 0 refills | Status: DC

## 2023-11-01 MED ORDER — LACTINEX PO CHEW: 1.0000 | CHEWABLE_TABLET | Freq: Three times a day (TID) | ORAL | 0 refills | Status: AC

## 2023-11-01 MED ORDER — FLUCONAZOLE 200 MG PO TABS
200.0000 mg | ORAL_TABLET | Freq: Every day | ORAL | 1 refills | Status: DC
Start: 2023-11-01 — End: 2023-11-01

## 2023-11-01 MED ORDER — CIPROFLOXACIN HCL 500 MG PO TABS
500.0000 mg | ORAL_TABLET | Freq: Two times a day (BID) | ORAL | 0 refills | Status: AC
Start: 2023-11-02 — End: 2023-11-07

## 2023-11-01 MED ORDER — LACTINEX PO CHEW: 1.0000 | CHEWABLE_TABLET | Freq: Three times a day (TID) | ORAL | 0 refills | Status: DC

## 2023-11-01 MED ORDER — PHENAZOPYRIDINE HCL 95 MG PO TABS: 95.0000 mg | ORAL_TABLET | Freq: Three times a day (TID) | ORAL | 0 refills | Status: AC

## 2023-11-01 MED ORDER — ONDANSETRON 4 MG PO TBDP: 4.0000 mg | ORAL_TABLET | Freq: Three times a day (TID) | ORAL | 0 refills | Status: DC | PRN

## 2023-11-01 MED ORDER — PHENAZOPYRIDINE HCL 95 MG PO TABS
95.0000 mg | ORAL_TABLET | Freq: Three times a day (TID) | ORAL | 0 refills | Status: DC
Start: 2023-11-01 — End: 2023-11-01

## 2023-11-01 MED ORDER — FLUCONAZOLE 200 MG PO TABS: 200.0000 mg | ORAL_TABLET | Freq: Every day | ORAL | 1 refills | Status: AC

## 2023-11-01 MED ORDER — ONDANSETRON 4 MG PO TBDP: 4.0000 mg | ORAL_TABLET | Freq: Three times a day (TID) | ORAL | 0 refills | Status: AC | PRN

## 2023-11-01 MED ORDER — TAMSULOSIN HCL 0.4 MG PO CAPS: 0.4000 mg | ORAL_CAPSULE | Freq: Every day | ORAL | 0 refills | Status: AC

## 2023-11-01 MED ORDER — CIPROFLOXACIN HCL 500 MG PO TABS
500.0000 mg | ORAL_TABLET | Freq: Two times a day (BID) | ORAL | 0 refills | Status: DC
Start: 2023-11-02 — End: 2023-11-01

## 2023-11-01 NOTE — Plan of Care (Signed)
 Patient has verbalized understanding of discharge teaching, patient adequate for discharge.

## 2023-11-01 NOTE — Care Management Important Message (Signed)
 Important Message  Patient Details IM Letter given. Name: Felicia Rivera MRN: [id number] Date of Birth: 26-Feb-1971   Important Message Given:  Yes - Medicare IM     Melba Ates 11/01/2023, 9:44 AM

## 2023-11-01 NOTE — Discharge Summary (Signed)
 Physician Discharge Summary   Patient: Felicia Rivera MRN: [id number] DOB: [birth year]  Admit date:     10/28/2023  Discharge date: 11/01/23  Discharge Physician: Adriana DELENA Grams   PCP: Sun, Vyvyan, MD   Recommendations at discharge:  Laser lithotripsy and basket extraction with Dr. Lovie on 10/30/2023. Tethered stent to be removed in 5 days. Follow-up with PCP in 1-2 weeks - Continue course of antibiotics (discharged on ciprofloxacin  based on sensitivity)  Discharge Diagnoses: Principal Problem:   Right ureteral stone Active Problems:   Urinary tract infection with hematuria   Severe protein-calorie malnutrition (HCC)  Resolved Problems:   * No resolved hospital problems. *  Hospital Course:  Felicia Rivera is a 53 year old with history of bipolar 1, fibromyalgia, OA, asthma, IBS, GERD, RLS, migraine comes to the ED for evaluation of back pain and flank pain.  CT renal stone shows 4 mm obstructing renal stone started on IV antibiotics, urology team consulted.  Planning for cystoscopy with stent placement on 7/2.   Urinary tract infection Obstructive nephropathy secondary to renal stone, 4 mm - Currently on IV Rocephin >>>> switch to p.o. ciprofloxacin  -Urine culture: Positive for staph -pansensitive with exception of doxycycline and Bactrim - Urology following closely -- 10/30/2023 S/P cystoscopy, right ureteroscopy removal of right distal fragment stones, and stent placement -and laser lithotripsy - Tolerated procedure well, still complaining some mild discomfort, continue as needed analgesics   -Still having dysuria-Azo medication provided   Bipolar 1 Fibromyalgia/insomnia -Mood stable now - On Klonopin , bupropion  and Lamictal  - Follows outpatient rheumatology - Continue Topamax    Asthma - As needed bronchodilator - Stable   Allergic rhinitis - Flonase , Atrovent    GERD - PPI   Severe protein calorie malnutrition Body mass index is 16.27 kg/m.   -Encourage  p.o. intake   Constipation - Continue stool softeners as needed     Disposition: Medicine inpatient,  From home - discharge HOME 11/01/2023    Consultants: Urologist  Disposition: Home Diet recommendation:  Discharge Diet Orders (From admission, onward)     Start     Ordered   11/01/23 0000  Diet - low sodium heart healthy        11/01/23 0913           Regular diet DISCHARGE MEDICATION: Allergies as of 11/01/2023       Reactions   Benztropine  Other (See Comments)   Dehydration and cognitive impairment   Other reaction(s): Unknown Other Reaction(s): Not available, shaky Cogentin    Cefdinir Nausea Only, Other (See Comments)   Other reaction(s): Unknown   Clarithromycin Other (See Comments)   Other reaction(s): Unknown   Erythromycin Nausea Only   Hydrocodone Nausea Only   Nsaids Other (See Comments)   Other reaction(s): Unknown   Pregabalin Other (See Comments)   Jittery  Other reaction(s): Unknown        Medication List     TAKE these medications    acetaminophen  500 MG tablet Commonly known as: TYLENOL  Take 500 mg by mouth every 6 (six) hours as needed.   albuterol  108 (90 Base) MCG/ACT inhaler Commonly known as: VENTOLIN  HFA Inhale into the lungs every 6 (six) hours as needed for wheezing or shortness of breath.   baclofen  10 MG tablet Commonly known as: LIORESAL  TAKE 1 TABLET BY MOUTH AT BEDTIME AS NEEDED FOR MUSCLE SPASMS.   BENADRYL PO Take by mouth as needed.   BIOFREEZE EX Apply topically as needed.   buPROPion  150 MG 24 hr tablet  Commonly known as: WELLBUTRIN  XL Take 150 mg by mouth daily.   cetirizine 10 MG tablet Commonly known as: ZYRTEC Take 10 mg by mouth daily.   ciprofloxacin  500 MG tablet Commonly known as: Cipro  Take 1 tablet (500 mg total) by mouth 2 (two) times daily for 5 days. Start taking on: November 02, 2023   clonazePAM  0.5 MG tablet Commonly known as: KLONOPIN  Take 1 mg by mouth 2 (two) times daily.    EMERGEN-C IMMUNE PO Take by mouth.   fluconazole  200 MG tablet Commonly known as: Diflucan  Take 1 tablet (200 mg total) by mouth daily for 2 days. May be taken while on antibiotics-or after antibiotic course- if develop yeast infection   fluticasone  50 MCG/ACT nasal spray Commonly known as: FLONASE  USE 2 SPRAYS IN EACH NOSTRIL ONCE A DAY DURING SEASONS OF DIFFICULTY 90   GAS-X PO Take by mouth as needed.   ipratropium 0.03 % nasal spray Commonly known as: ATROVENT  Place 1 spray into the nose 2 (two) times daily.   lactobacillus acidophilus & bulgar chewable tablet Chew 1 tablet by mouth 3 (three) times daily with meals for 7 days.   lamoTRIgine  200 MG tablet Commonly known as: LAMICTAL  Take 200 mg by mouth daily.   loperamide 2 MG capsule Commonly known as: IMODIUM Take by mouth as needed for diarrhea or loose stools.   omeprazole 20 MG capsule Commonly known as: PRILOSEC Take 20 mg by mouth every morning.   ondansetron  4 MG disintegrating tablet Commonly known as: ZOFRAN -ODT Take 1 tablet (4 mg total) by mouth every 8 (eight) hours as needed for nausea or vomiting.   phenazopyridine  95 MG tablet Commonly known as: PYRIDIUM  Take 1 tablet (95 mg total) by mouth 3 (three) times daily for 5 days.   Pro Comfort Tens Unit [place] by Does not apply route as needed.   QUEtiapine  300 MG tablet Commonly known as: SEROQUEL  Take 300 mg by mouth at bedtime.   tamsulosin  0.4 MG Caps capsule Commonly known as: FLOMAX  Take 1 capsule (0.4 mg total) by mouth daily for 10 days.   topiramate  25 MG tablet Commonly known as: TOPAMAX  Take 2 tablets (50 mg total) by mouth at bedtime.   TUMS PO Take by mouth as needed.   Wixela Inhub 250-50 MCG/ACT Aepb Generic drug: fluticasone -salmeterol Inhale 1 puff into the lungs in the morning and at bedtime.        Discharge Exam: Filed Weights   10/28/23 1655 10/29/23 0100 10/30/23 1002  Weight: 40.2 kg 43 kg 43 kg         General:  AAO x 3,  cooperative, no distress;   HEENT:  Normocephalic, PERRL, otherwise with in Normal limits   Neuro:  CNII-XII intact. , normal motor and sensation, reflexes intact   Lungs:   Clear to auscultation BL, Respirations unlabored,  No wheezes / crackles  Cardio:    S1/S2, RRR, No murmure, No Rubs or Gallops   Abdomen:  Soft, non-tender, bowel sounds active all four quadrants, no guarding or peritoneal signs.  Muscular  skeletal:  Limited exam -global generalized weaknesses - in bed, able to move all 4 extremities,   2+ pulses,  symmetric, No pitting edema  Skin:  Dry, warm to touch, negative for any Rashes,  Wounds: Please see nursing documentation          Condition at discharge: good  The results of significant diagnostics from this hospitalization (including imaging, microbiology, ancillary and laboratory) are listed below  for reference.   Imaging Studies: DG C-Arm 1-60 Min-No Report Result Date: 10/30/2023 Fluoroscopy was utilized by the requesting physician.  No radiographic interpretation.   CT Renal Stone Study Result Date: 10/28/2023 CLINICAL DATA:  Right-sided pelvic pain. EXAM: CT ABDOMEN AND PELVIS WITHOUT CONTRAST TECHNIQUE: Multidetector CT imaging of the abdomen and pelvis was performed following the standard protocol without IV contrast. RADIATION DOSE REDUCTION: This exam was performed according to the departmental dose-optimization program which includes automated exposure control, adjustment of the mA and/or kV according to patient size and/or use of iterative reconstruction technique. COMPARISON:  None Available. FINDINGS: Lower chest: No acute abnormality. Hepatobiliary: No focal liver abnormality is seen. No gallstones, gallbladder wall thickening, or biliary dilatation. Pancreas: Unremarkable. No pancreatic ductal dilatation or surrounding inflammatory changes. Spleen: Normal in size without focal abnormality. Adrenals/Urinary Tract: Adrenal glands are  unremarkable. Kidneys are normal in size without focal lesions. 3 mm, 5 mm and 11 mm nonobstructing renal calculi are seen within the right kidney. A 3 mm nonobstructing left renal calculus is also seen. A 4 mm obstructing renal calculus is seen within the distal right ureter, near the right UVJ. The urinary bladder is poorly distended and subsequently limited in evaluation. Stomach/Bowel: Stomach is within normal limits. Appendix appears normal. No evidence of bowel wall thickening, distention, or inflammatory changes. Vascular/Lymphatic: No significant vascular findings are present. No enlarged abdominal or pelvic lymph nodes. Reproductive: Status post hysterectomy. No adnexal masses. Other: No abdominal wall hernia or abnormality. No abdominopelvic ascites. Musculoskeletal: No acute or significant osseous findings. IMPRESSION: 1. 4 mm obstructing renal calculus within the distal right ureter, near the right UVJ. 2. Bilateral nonobstructing renal calculi. Electronically Signed   By: Suzen Dials M.D.   On: 10/28/2023 21:52   DG Chest 2 View Result Date: 10/27/2023 CLINICAL DATA:  CP EXAM: CHEST - 2 VIEW COMPARISON:  June 19, 2023 FINDINGS: Hyperinflated lungs. No focal airspace consolidation, pleural effusion, or pneumothorax. No cardiomegaly.No acute fracture or destructive lesion. IMPRESSION: No acute cardiopulmonary abnormality. Electronically Signed   By: Rogelia Myers M.D.   On: 10/27/2023 18:28    Microbiology: Results for orders placed or performed during the hospital encounter of 10/28/23  Urine Culture     Status: Abnormal   Collection Time: 10/28/23  9:09 PM   Specimen: Urine, Clean Catch  Result Value Ref Range Status   Specimen Description   Final    URINE, CLEAN CATCH Performed at [facility], [address], [place], [place] [address]    Special Requests   Final    NONE Performed at [facility], [address], [place], [place]  [address]    Culture >=100,000 COLONIES/mL STAPHYLOCOCCUS EPIDERMIDIS (A)  Final   Report Status 10/30/2023 FINAL  Final   Organism ID, Bacteria STAPHYLOCOCCUS EPIDERMIDIS (A)  Final      Susceptibility   Staphylococcus epidermidis - MIC*    CIPROFLOXACIN  <=0.5 SENSITIVE Sensitive     GENTAMICIN <=0.5 SENSITIVE Sensitive     NITROFURANTOIN <=16 SENSITIVE Sensitive     OXACILLIN >=4 RESISTANT Resistant     TETRACYCLINE <=1 SENSITIVE Sensitive     VANCOMYCIN 1 SENSITIVE Sensitive     TRIMETH/SULFA 160 RESISTANT Resistant     RIFAMPIN <=0.5 SENSITIVE Sensitive     Inducible Clindamycin NEGATIVE Sensitive     * >=100,000 COLONIES/mL STAPHYLOCOCCUS EPIDERMIDIS  Surgical pcr screen     Status: Abnormal   Collection Time: 10/30/23  5:45 AM  Specimen: Nasal Mucosa; Nasal Swab  Result Value Ref Range Status   MRSA, PCR NEGATIVE NEGATIVE Final   Staphylococcus aureus POSITIVE (A) NEGATIVE Final    Comment: (NOTE) The Xpert SA Assay (FDA approved for NASAL specimens in patients 81 years of age and older), is one component of a comprehensive surveillance program. It is not intended to diagnose infection nor to guide or monitor treatment. Performed at [facility], [address], [place], [place] [address]     Labs: CBC: Recent Labs  Lab 10/28/23 1703 10/29/23 0333 10/30/23 0335 10/31/23 0327 11/01/23 0319  WBC 12.8* 9.5 6.4 6.5 7.5  NEUTROABS 11.3*  --   --   --   --   HGB 14.8 11.9* 10.6* 11.1* 10.9*  HCT 44.3 36.9 32.5* 33.4* 33.4*  MCV 96.1 99.7 99.1 95.7 97.1  PLT 207 135* 114* 123* 145*   Basic Metabolic Panel: Recent Labs  Lab 10/28/23 1703 10/29/23 0333 10/30/23 0335 10/31/23 0327 11/01/23 0319  NA 140 141 139 138 142  K 4.6 3.5 3.1* 3.8 3.3*  CL 105 112* 113* 113* 109  CO2 25 19* 21* 20* 22  GLUCOSE 151* 93 91 174* 138*  BUN 18 21* 17 15 19   CREATININE 1.36* 1.40* 1.66* 1.18* 1.22*  CALCIUM 10.2 8.8* 8.0* 8.7* 8.2*  MG 2.3  --  1.7 2.0 1.8   PHOS  --   --  3.3  --   --    Liver Function Tests: Recent Labs  Lab 10/28/23 1703 10/29/23 0333  AST 25 14*  ALT 9 7  ALKPHOS 50 38  BILITOT 1.1 0.6  PROT 7.5 5.8*  ALBUMIN 4.2 3.2*   CBG: No results for input(s): GLUCAP in the last 168 hours.  Discharge time spent: greater than 40 minutes.  Signed: Adriana DELENA Grams, MD [facility] 11/01/2023

## 2023-11-01 NOTE — Plan of Care (Signed)
Care plan complete and adequate for discharge.

## 2023-11-19 ENCOUNTER — Encounter (HOSPITAL_COMMUNITY): Payer: Self-pay

## 2023-11-19 ENCOUNTER — Ambulatory Visit (HOSPITAL_COMMUNITY)
Admission: EM | Admit: 2023-11-19 | Discharge: 2023-11-19 | Disposition: A | Discharge status: Home / Self Care | Attending: Internal Medicine | Admitting: Internal Medicine

## 2023-11-19 DIAGNOSIS — R1031 Right lower quadrant pain: Secondary | ICD-10-CM | POA: Diagnosis not present

## 2023-11-19 DIAGNOSIS — M545 Low back pain, unspecified: Secondary | ICD-10-CM | POA: Diagnosis present

## 2023-11-19 LAB — CBC
HCT: 38.9 % (ref 36.0–46.0)
Hemoglobin: 12.7 g/dL (ref 12.0–15.0)
MCH: 31.6 pg (ref 26.0–34.0)
MCHC: 32.6 g/dL (ref 30.0–36.0)
MCV: 96.8 fL (ref 80.0–100.0)
Platelets: 240 K/uL (ref 150–400)
RBC: 4.02 MIL/uL (ref 3.87–5.11)
RDW: 12.4 % (ref 11.5–15.5)
WBC: 6.4 K/uL (ref 4.0–10.5)
nRBC: 0 % (ref 0.0–0.2)

## 2023-11-19 LAB — BASIC METABOLIC PANEL WITH GFR
Anion gap: 12 (ref 5–15)
BUN: 19 mg/dL (ref 6–20)
CO2: 23 mmol/L (ref 22–32)
Calcium: 9.6 mg/dL (ref 8.9–10.3)
Chloride: 104 mmol/L (ref 98–111)
Creatinine, Ser: 1.14 mg/dL — ABNORMAL HIGH (ref 0.44–1.00)
GFR, Estimated: 58 mL/min — ABNORMAL LOW (ref >60)
Glucose, Bld: 87 mg/dL (ref 70–99)
Potassium: 3.8 mmol/L (ref 3.5–5.1)
Sodium: 139 mmol/L (ref 135–145)

## 2023-11-19 LAB — POCT URINALYSIS DIP (MANUAL ENTRY)
Bilirubin, UA: NEGATIVE
Blood, UA: NEGATIVE
Glucose, UA: NEGATIVE mg/dL
Ketones, POC UA: NEGATIVE mg/dL
Leukocytes, UA: NEGATIVE
Nitrite, UA: NEGATIVE
Protein Ur, POC: NEGATIVE mg/dL
Spec Grav, UA: 1.015 (ref 1.010–1.025)
Urobilinogen, UA: 0.2 U/dL
pH, UA: 7 (ref 5.0–8.0)

## 2023-11-19 NOTE — Discharge Instructions (Addendum)
 Urinalysis done today and is normal. Given the symptoms and the recent hospitalization we will order blood work including complete blood count and metabolic panel. This will take 24-48 hours to get final results. If anything is abnormal and requires further treatment, we will contact you. Your vital signs, physical exam findings and the normal urinalysis are reassuring.   Recommend calling urology as soon as possible to discuss the symptoms as well.

## 2023-11-19 NOTE — ED Triage Notes (Signed)
 Patient here today with c/o some discomfort in urination since last night. Patient was in the hospital last month for UTI.   Patient also c/o mid back pain in the middle of the night last night. Patient states that it feels muscular.

## 2023-11-19 NOTE — ED Provider Notes (Signed)
 [facility]    CSN: [id number] Arrival date & time: 11/19/23  1512      History   Chief Complaint Chief Complaint  Patient presents with   Dysuria   Back Pain    HPI Felicia Rivera is a 53 y.o. female.   53 year old female who presents urgent care with complaints of right groin discomfort and lower back pain.  She reports that last night this started with some right groin pain with urination.  She has also had some lower back pain that seems to be worse with movement.  She was just recently admitted to the hospital secondary to a ureteral stone and had to have stent placement and removal of residual stone secondary to an obstructing ureteral stone.  She removed the stent 5 days after discharge.  She reports she had been doing well overall until yesterday.  She came in as she wanted to be sure she did not have another urinary tract infection.  She denies any fevers, chills, mid back pain, difficulty urinating, nausea, vomiting.  She does have a poor appetite but this has been ongoing and is seeing a nutritionist next week as she is needing help to gain weight.    Dysuria Associated symptoms: no abdominal pain, no fever and no vomiting   Back Pain Associated symptoms: no abdominal pain, no chest pain, no dysuria and no fever     Past Medical History:  Diagnosis Date   Arthritis    Bipolar 1 disorder (HCC) 02/28/2016   Bipolar disorder (HCC) 02/28/2016   Bursitis    Fibromyalgia 02/28/2016   GERD (gastroesophageal reflux disease) 02/28/2016   IBS (irritable bowel syndrome) 02/28/2016   Migraine 02/28/2016   Periodic limb movement disorder (PLMD)    RLS (restless legs syndrome) 02/28/2016   Ulnar neuropathy at elbow, left 02/28/2016   Vitamin D  deficiency 02/28/2016    Patient Active Problem List   Diagnosis Date Noted   Urinary tract infection with hematuria 10/29/2023   Severe protein-calorie malnutrition (HCC) 10/29/2023   Right ureteral stone 10/28/2023    Other fatigue 07/25/2016   Other insomnia 07/25/2016   Bilateral sacroiliitis (HCC) 07/25/2016   Trochanteric bursitis of both hips 07/25/2016   Fibromyalgia 02/28/2016   Ulnar neuropathy at elbow, left 02/28/2016   Bipolar disorder (HCC) 02/28/2016   Vitamin D  deficiency 02/28/2016   Migraine 02/28/2016   RLS (restless legs syndrome) 02/28/2016   IBS (irritable bowel syndrome) 02/28/2016   GERD (gastroesophageal reflux disease) 02/28/2016    Past Surgical History:  Procedure Laterality Date   ABDOMINAL HYSTERECTOMY     CYSTOSCOPY/URETEROSCOPY/HOLMIUM LASER/STENT PLACEMENT Right 10/30/2023   Procedure: CYSTOSCOPY/URETEROSCOPY/HOLMIUM LASER/STENT PLACEMENT;  Surgeon: Lovie Arlyss CROME, MD;  Location: [facility];  Service: Urology;  Laterality: Right;   dental work  04/16/2017   x2    OB History   No obstetric history on file.      Home Medications    Prior to Admission medications   Medication Sig Start Date End Date Taking? Authorizing Provider  acetaminophen  (TYLENOL ) 500 MG tablet Take 500 mg by mouth every 6 (six) hours as needed.    [provider]  albuterol  (PROVENTIL  HFA;VENTOLIN  HFA) 108 (90 Base) MCG/ACT inhaler Inhale into the lungs every 6 (six) hours as needed for wheezing or shortness of breath.    [provider]  baclofen  (LIORESAL ) 10 MG tablet TAKE 1 TABLET BY MOUTH AT BEDTIME AS NEEDED FOR MUSCLE SPASMS. 05/29/21   Cheryl Waddell HERO, PA-C  buPROPion  (WELLBUTRIN  XL) 150 MG 24 hr tablet Take 150 mg by mouth daily.    [provider]  Calcium Carbonate Antacid (TUMS PO) Take by mouth as needed.    [provider]  cetirizine (ZYRTEC) 10 MG tablet Take 10 mg by mouth daily.    [provider]  clonazePAM  (KLONOPIN ) 0.5 MG tablet Take 1 mg by mouth 2 (two) times daily. 09/19/23   [provider]  DiphenhydrAMINE HCl (BENADRYL PO) Take by mouth as needed.    [provider]  fluticasone  (FLONASE ) 50 MCG/ACT nasal  spray USE 2 SPRAYS IN EACH NOSTRIL ONCE A DAY DURING SEASONS OF DIFFICULTY 90    [provider]  fluticasone -salmeterol (WIXELA INHUB) 250-50 MCG/ACT AEPB Inhale 1 puff into the lungs in the morning and at bedtime.    [provider]  ipratropium (ATROVENT ) 0.03 % nasal spray Place 1 spray into the nose 2 (two) times daily.    [provider]  lamoTRIgine  (LAMICTAL ) 200 MG tablet Take 200 mg by mouth daily.    [provider]  loperamide (IMODIUM) 2 MG capsule Take by mouth as needed for diarrhea or loose stools.    [provider]  Menthol, Topical Analgesic, (BIOFREEZE EX) Apply topically as needed.    [provider]  Nerve Stimulator (PRO COMFORT TENS UNIT) [place] by Does not apply route as needed.     [provider]  omeprazole (PRILOSEC) 20 MG capsule Take 20 mg by mouth every morning. 01/09/21   [provider]  ondansetron  (ZOFRAN -ODT) 4 MG disintegrating tablet Take 1 tablet (4 mg total) by mouth every 8 (eight) hours as needed for nausea or vomiting. 11/01/23   Shahmehdi, Adriana LABOR, MD  QUEtiapine  (SEROQUEL ) 300 MG tablet Take 300 mg by mouth at bedtime.    [provider]  Simethicone  (GAS-X PO) Take by mouth as needed.     [provider]  topiramate  (TOPAMAX ) 25 MG tablet Take 2 tablets (50 mg total) by mouth at bedtime. 10/17/23   Cheryl Waddell HERO, PA-C    Family History Family History  Problem Relation Age of Onset   Kidney failure Mother        Stage 3   Diabetes Mother    Glaucoma Mother    Heart Problems Mother        pacemaker, defibrilator    Hypertension Mother    Congestive Heart Failure Mother    Rheum arthritis Mother        possibly   COPD Mother    Emphysema Mother    Hypertension Father    High Cholesterol Father    Heart Problems Father    Leukemia Father    Acute myelogenous leukemia Father     Social History Social History   Tobacco Use   Smoking status: Never     Passive exposure: Past   Smokeless tobacco: Never  Vaping Use   Vaping status: Never Used  Substance Use Topics   Alcohol use: Yes    Comment: Occassionally   Drug use: Never     Allergies   Benztropine , Cefdinir, Clarithromycin, Erythromycin, Hydrocodone, Nsaids, and Pregabalin   Review of Systems Review of Systems  Constitutional:  Positive for appetite change (poor for months). Negative for chills and fever.  HENT:  Negative for ear pain and sore throat.   Eyes:  Negative for pain and visual disturbance.  Respiratory:  Negative for cough and shortness of breath.   Cardiovascular:  Negative  for chest pain and palpitations.  Gastrointestinal:  Negative for abdominal pain and vomiting.  Genitourinary:  Negative for dysuria and hematuria.       Right groin pain with urination  Musculoskeletal:  Positive for back pain. Negative for arthralgias.  Skin:  Negative for color change and rash.  Neurological:  Negative for seizures and syncope.  All other systems reviewed and are negative.    Physical Exam Triage Vital Signs ED Triage Vitals  Encounter Vitals Group     BP 11/19/23 1547 (!) 105/58     Girls Systolic BP Percentile --      Girls Diastolic BP Percentile --      Boys Systolic BP Percentile --      Boys Diastolic BP Percentile --      Pulse Rate 11/19/23 1547 (!) 105     Resp 11/19/23 1547 16     Temp 11/19/23 1547 97.8 F (36.6 C)     Temp Source 11/19/23 1547 Oral     SpO2 11/19/23 1547 94 %     Weight --      Height --      Head Circumference --      Peak Flow --      Pain Score 11/19/23 1546 5     Pain Loc --      Pain Education --      Exclude from Growth Chart --    No data found.  Updated Vital Signs BP (!) 105/58 (BP Location: Left Arm)   Pulse (!) 105   Temp 97.8 F (36.6 C) (Oral)   Resp 16   SpO2 94%   Visual Acuity Right Eye Distance:   Left Eye Distance:   Bilateral Distance:    Right Eye Near:   Left Eye Near:    Bilateral  Near:     Physical Exam Vitals and nursing note reviewed.  Constitutional:      General: She is not in acute distress.    Appearance: She is well-developed.  HENT:     Head: Normocephalic and atraumatic.     Right Ear: Tympanic membrane normal.     Left Ear: Tympanic membrane normal.     Nose: Nose normal.     Mouth/Throat:     Mouth: Mucous membranes are moist.  Eyes:     Conjunctiva/sclera: Conjunctivae normal.  Cardiovascular:     Rate and Rhythm: Normal rate and regular rhythm.     Heart sounds: No murmur heard. Pulmonary:     Effort: Pulmonary effort is normal. No respiratory distress.     Breath sounds: Normal breath sounds.  Abdominal:     General: Bowel sounds are normal.     Palpations: Abdomen is soft.     Tenderness: There is no abdominal tenderness.     Hernia: No hernia is present.  Musculoskeletal:        General: No swelling.     Cervical back: Neck supple.  Skin:    General: Skin is warm and dry.     Capillary Refill: Capillary refill takes less than 2 seconds.  Neurological:     Mental Status: She is alert.  Psychiatric:        Mood and Affect: Mood normal.      UC Treatments / Results  Labs (all labs ordered are listed, but only abnormal results are displayed) Labs Reviewed  BASIC METABOLIC PANEL WITH GFR  CBC  POCT URINALYSIS DIP (MANUAL ENTRY)    EKG  Radiology No results found.  Procedures Procedures (including critical care time)  Medications Ordered in UC Medications - No data to display  Initial Impression / Assessment and Plan / UC Course  I have reviewed the triage vital signs and the nursing notes.  Pertinent labs & imaging results that were available during my care of the patient were reviewed by me and considered in my medical decision making (see chart for details).     Acute bilateral low back pain without sciatica  Right groin pain   Urinalysis done today and is normal. Given the symptoms and the recent  hospitalization we will order blood work including complete blood count and metabolic panel. This will take 24-48 hours to get final results. If anything is abnormal and requires further treatment, we will contact you. Your vital signs, physical exam findings and the normal urinalysis are reassuring.   Recommend calling urology as soon as possible to discuss the symptoms as well.   Final Clinical Impressions(s) / UC Diagnoses   Final diagnoses:  Acute bilateral low back pain without sciatica  Right groin pain     Discharge Instructions      Urinalysis done today and is normal. Given the symptoms and the recent hospitalization we will order blood work including complete blood count and metabolic panel. This will take 24-48 hours to get final results. If anything is abnormal and requires further treatment, we will contact you. Your vital signs, physical exam findings and the normal urinalysis are reassuring.   Recommend calling urology as soon as possible to discuss the symptoms as well.      ED Prescriptions   None    PDMP not reviewed this encounter.   Teresa Almarie LABOR, PA-C 11/19/23 [phone]

## 2023-11-20 ENCOUNTER — Ambulatory Visit (HOSPITAL_COMMUNITY): Payer: Self-pay

## 2023-11-20 NOTE — Telephone Encounter (Signed)
 Agree there has been modest improvement in patient's renal function.  Complete blood cell count is normal not concerning for any sign of infection.  Recommend continued follow-up with primary care.  No change to current plan of care.

## 2024-01-12 ENCOUNTER — Other Ambulatory Visit: Payer: Self-pay | Admitting: Physician Assistant

## 2024-01-13 NOTE — Telephone Encounter (Signed)
 Last Fill: 10/17/2023  Next Visit: 04/21/2024  Last Visit: 10/17/2023  Dx: Fibromyalgia   Current Dose per office note on 10/17/2023: Topamax  back to 50 mg at bedtime   Okay to refill Topamax ?

## 2024-04-07 NOTE — Progress Notes (Unsigned)
 Office Visit Note  Patient: Felicia Rivera             Date of Birth: 13-Jul-1970           MRN: [id number]             PCP: Sun, Vyvyan, MD Referring: Sun, Vyvyan, MD Visit Date: 04/21/2024 Occupation: Data Unavailable  Subjective:  No chief complaint on file.   History of Present Illness: Felicia Rivera is a 53 y.o. female ***     Activities of Daily Living:  Patient reports morning stiffness for *** {minute/hour:19697}.   Patient {ACTIONS;DENIES/REPORTS:21021675::Denies} nocturnal pain.  Difficulty dressing/grooming: {ACTIONS;DENIES/REPORTS:21021675::Denies} Difficulty climbing stairs: {ACTIONS;DENIES/REPORTS:21021675::Denies} Difficulty getting out of chair: {ACTIONS;DENIES/REPORTS:21021675::Denies} Difficulty using hands for taps, buttons, cutlery, and/or writing: {ACTIONS;DENIES/REPORTS:21021675::Denies}  No Rheumatology ROS completed.   PMFS History:  Patient Active Problem List   Diagnosis Date Noted   Urinary tract infection with hematuria 10/29/2023   Severe protein-calorie malnutrition 10/29/2023   Right ureteral stone 10/28/2023   Other fatigue 07/25/2016   Other insomnia 07/25/2016   Bilateral sacroiliitis 07/25/2016   Trochanteric bursitis of both hips 07/25/2016   Fibromyalgia 02/28/2016   Ulnar neuropathy at elbow, left 02/28/2016   Bipolar disorder (HCC) 02/28/2016   Vitamin D  deficiency 02/28/2016   Migraine 02/28/2016   RLS (restless legs syndrome) 02/28/2016   IBS (irritable bowel syndrome) 02/28/2016   GERD (gastroesophageal reflux disease) 02/28/2016    Past Medical History:  Diagnosis Date   Arthritis    Bipolar 1 disorder (HCC) 02/28/2016   Bipolar disorder (HCC) 02/28/2016   Bursitis    Fibromyalgia 02/28/2016   GERD (gastroesophageal reflux disease) 02/28/2016   IBS (irritable bowel syndrome) 02/28/2016   Migraine 02/28/2016   Periodic limb movement disorder (PLMD)    RLS (restless legs syndrome) 02/28/2016   Ulnar  neuropathy at elbow, left 02/28/2016   Vitamin D  deficiency 02/28/2016    Family History  Problem Relation Age of Onset   Kidney failure Mother        Stage 3   Diabetes Mother    Glaucoma Mother    Heart Problems Mother        pacemaker, defibrilator    Hypertension Mother    Congestive Heart Failure Mother    Rheum arthritis Mother        possibly   COPD Mother    Emphysema Mother    Hypertension Father    High Cholesterol Father    Heart Problems Father    Leukemia Father    Acute myelogenous leukemia Father    Past Surgical History:  Procedure Laterality Date   ABDOMINAL HYSTERECTOMY     CYSTOSCOPY/URETEROSCOPY/HOLMIUM LASER/STENT PLACEMENT Right 10/30/2023   Procedure: CYSTOSCOPY/URETEROSCOPY/HOLMIUM LASER/STENT PLACEMENT;  Surgeon: Lovie Arlyss CROME, MD;  Location: [facility];  Service: Urology;  Laterality: Right;   dental work  04/16/2017   x2   Social History   Tobacco Use   Smoking status: Never    Passive exposure: Past   Smokeless tobacco: Never  Vaping Use   Vaping status: Never Used  Substance Use Topics   Alcohol use: Yes    Comment: Occassionally   Drug use: Never   Social History   Social History Narrative   Left handed     Immunization History  Administered Date(s) Administered   PFIZER(Purple Top)SARS-COV-2 Vaccination 07/18/2019, 08/08/2019, 02/09/2020     Objective: Vital Signs: There were no vitals taken for this visit.   Physical Exam   Musculoskeletal Exam: ***  CDAI Exam: CDAI Score: -- Patient Global: --; Provider Global: -- Swollen: --; Tender: -- Joint Exam 04/21/2024   No joint exam has been documented for this visit   There is currently no information documented on the homunculus. Go to the Rheumatology activity and complete the homunculus joint exam.  Investigation: No additional findings.  Imaging: No results found.  Recent Labs: Lab Results  Component Value Date   WBC 6.4 11/19/2023   HGB 12.7 11/19/2023    PLT 240 11/19/2023   NA 139 11/19/2023   K 3.8 11/19/2023   CL 104 11/19/2023   CO2 23 11/19/2023   GLUCOSE 87 11/19/2023   BUN 19 11/19/2023   CREATININE 1.14 (H) 11/19/2023   BILITOT 0.6 10/29/2023   ALKPHOS 38 10/29/2023   AST 14 (L) 10/29/2023   ALT 7 10/29/2023   PROT 5.8 (L) 10/29/2023   ALBUMIN 3.2 (L) 10/29/2023   CALCIUM 9.6 11/19/2023   GFRAA 51 (L) 07/31/2016    Speciality Comments: No specialty comments available.  Procedures:  No procedures performed Allergies: Benztropine , Cefdinir, Clarithromycin, Erythromycin, Hydrocodone, Nsaids, and Pregabalin   Assessment / Plan:     Visit Diagnoses: No diagnosis found.  Orders: No orders of the defined types were placed in this encounter.  No orders of the defined types were placed in this encounter.   Face-to-face time spent with patient was *** minutes. Greater than 50% of time was spent in counseling and coordination of care.  Follow-Up Instructions: No follow-ups on file.   Daved JAYSON Gavel, CMA  Note - This record has been created using Animal nutritionist.  Chart creation errors have been sought, but may not always  have been located. Such creation errors do not reflect on  the standard of medical care.

## 2024-04-10 ENCOUNTER — Other Ambulatory Visit: Payer: Self-pay

## 2024-04-10 MED ORDER — TOPIRAMATE 25 MG PO TABS: 50.0000 mg | ORAL_TABLET | Freq: Every day | ORAL | 0 refills | Status: AC

## 2024-04-10 NOTE — Telephone Encounter (Signed)
 Refill request received via fax from [facility] for Topamax   Last Fill: 01/13/2024  Next Visit: 04/21/2024  Last Visit: 10/17/2023  Dx: Fibromyalgia   Current Dose per office note on 10/17/2023: Topamax  back to 50 mg at bedtime   Okay to refill Topamax ?

## 2024-04-21 ENCOUNTER — Encounter: Payer: Self-pay | Admitting: Rheumatology

## 2024-04-21 ENCOUNTER — Ambulatory Visit: Attending: Rheumatology | Admitting: Rheumatology

## 2024-04-21 VITALS — BP 105/73 | HR 116 | Temp 98.2°F | Resp 16 | Ht 64.0 in | Wt 105.0 lb

## 2024-04-21 DIAGNOSIS — R5383 Other fatigue: Secondary | ICD-10-CM | POA: Diagnosis present

## 2024-04-21 DIAGNOSIS — M7061 Trochanteric bursitis, right hip: Secondary | ICD-10-CM | POA: Diagnosis present

## 2024-04-21 DIAGNOSIS — M797 Fibromyalgia: Secondary | ICD-10-CM | POA: Diagnosis present

## 2024-04-21 DIAGNOSIS — G2581 Restless legs syndrome: Secondary | ICD-10-CM | POA: Insufficient documentation

## 2024-04-21 DIAGNOSIS — Z8659 Personal history of other mental and behavioral disorders: Secondary | ICD-10-CM | POA: Insufficient documentation

## 2024-04-21 DIAGNOSIS — Q667 Congenital pes cavus, unspecified foot: Secondary | ICD-10-CM | POA: Insufficient documentation

## 2024-04-21 DIAGNOSIS — M62838 Other muscle spasm: Secondary | ICD-10-CM | POA: Insufficient documentation

## 2024-04-21 DIAGNOSIS — Z8639 Personal history of other endocrine, nutritional and metabolic disease: Secondary | ICD-10-CM | POA: Insufficient documentation

## 2024-04-21 DIAGNOSIS — Z8719 Personal history of other diseases of the digestive system: Secondary | ICD-10-CM | POA: Diagnosis present

## 2024-04-21 DIAGNOSIS — M19041 Primary osteoarthritis, right hand: Secondary | ICD-10-CM | POA: Diagnosis present

## 2024-04-21 DIAGNOSIS — M7062 Trochanteric bursitis, left hip: Secondary | ICD-10-CM | POA: Diagnosis present

## 2024-04-21 DIAGNOSIS — M19042 Primary osteoarthritis, left hand: Secondary | ICD-10-CM | POA: Diagnosis present

## 2024-04-21 DIAGNOSIS — Z8669 Personal history of other diseases of the nervous system and sense organs: Secondary | ICD-10-CM | POA: Diagnosis present

## 2024-04-21 DIAGNOSIS — G4709 Other insomnia: Secondary | ICD-10-CM | POA: Diagnosis present

## 2024-04-21 NOTE — Patient Instructions (Signed)
 Thoracic Strain Rehab Ask your health care provider which exercises are safe for you. Do exercises exactly as told by your provider and adjust them as directed. It is normal to feel mild stretching, pulling, tightness, or discomfort as you do these exercises. Stop right away if you feel sudden pain or your pain gets worse. Do not begin these exercises until told by your provider. Stretching and range-of-motion exercise This exercise warms up your muscles and joints and improves the movement and flexibility of your back and shoulders. This exercise also helps to relieve pain. Chest and spine stretch  Lie down on your back on a firm surface. Roll a towel or a small blanket so it is about 4 inches (10 cm) in diameter. Put the towel under the middle of your back so it is under your spine, but not under your shoulder blades. Put your hands behind your head and let your elbows fall to your sides. This will increase your stretch. Take a deep breath (inhale). Hold for __________ seconds. Relax after you breathe out (exhale). Repeat __________ times. Complete this exercise __________ times a day. Strengthening exercises These exercises build strength and endurance in your back and your shoulder blade muscles. Endurance is the ability to use your muscles for a long time, even after they get tired. Alternating arm and leg raises  Get on your hands and knees on a firm surface. If you are on a hard floor, you may want to use padding, such as an exercise mat, to cushion your knees. Line up your arms and legs. Your hands should be directly below your shoulders, and your knees should be directly below your hips. Lift your left leg behind you. At the same time, raise your right arm and straighten it in front of you. Do not lift your leg higher than your hip. Do not lift your arm higher than your shoulder. Keep your abdominal and back muscles tight. Keep your hips facing the ground. Do not arch your  back. Carefully stay balanced. Do not hold your breath. Hold for __________ seconds. Slowly return to the starting position and repeat with your right leg and your left arm. Repeat __________ times. Complete this exercise __________ times a day. Straight arm rows This exercise is also called the shoulder extension exercise. Stand with your feet shoulder width apart. Secure an exercise band to a stable object in front of you so the band is at or above shoulder height. Hold one end of the exercise band in each hand. Straighten your elbows and lift your hands up to shoulder height. Step back, away from the secured end of the exercise band, until the band stretches. Squeeze your shoulder blades together and pull your hands down to the sides of your thighs. Stop when your hands are straight down by your sides. This is shoulder extension. Do not let your hands go behind your body. Hold for __________ seconds. Slowly return to the starting position. Repeat __________ times. Complete this exercise __________ times a day. Rowing scapular retraction This is an exercise in which the shoulder blades (scapulae) are pulled toward each other (retraction). Sit in a stable chair without armrests, or stand up. Secure an exercise band to a stable object in front of you so the band is at shoulder height. Hold one end of the exercise band in each hand. Your palms should face toward each other. Bring your arms out straight in front of you. Step back, away from the secured end of the  exercise band, until the band stretches. Pull the band backward. As you do this, bend your elbows and squeeze your shoulder blades together, but avoid letting the rest of your body move. Do not shrug your shoulders upward while you do this. Stop when your elbows are at your sides or slightly behind your body. Hold for __________ seconds. Slowly straighten your arms to return to the starting position. Repeat __________ times.  Complete this exercise __________ times a day. Posture and body mechanics Good posture and healthy body mechanics can help to relieve stress in your body's tissues and joints. Body mechanics refers to the movements and positions of your body while you do your daily activities. Posture is part of body mechanics. Good posture means: Your spine is in its natural S-curve position (neutral). Your shoulders are pulled back slightly. Your head is not tipped forward. Follow these guidelines to improve your posture and body mechanics in your everyday activities. Standing  When standing, keep your spine neutral and your feet about hip width apart. Keep a slight bend in your knees. Your ears, shoulders, and hips should line up with each other. When you do a task in which you lean forward while standing in one place for a long time, place one foot up on a stable object that is 2-4 inches (5-10 cm) high, such as a footstool. This helps keep your spine neutral. Sitting  When sitting, keep your spine neutral and keep your feet flat on the floor. Use a footrest if needed. Keep your thighs parallel to the floor. Avoid rounding your shoulders, and avoid tilting your head forward. When working at a desk or a computer, keep your desk at a height where your hands are slightly lower than your elbows. Slide your chair under your desk so you are close enough to maintain good posture. When working at a computer, place your monitor at a height where you are looking straight ahead and you do not have to tilt your head forward or downward to look at the screen. Resting When lying down and resting, avoid positions that are most painful for you. If you have pain with activities such as sitting, bending, stooping, or squatting (flexion-basedactivities), lie in a position in which your body does not bend very much. For example, avoid curling up on your side with your arms and knees near your chest (fetal position). If you have  pain with activities such as standing for a long time or reaching with your arms (extension-basedactivities), lie with your spine in a neutral position and bend your knees slightly. Try the following positions: Lie on your side with a pillow between your knees. Lie on your back with a pillow under your knees.  Lifting  When lifting objects, keep your feet at least shoulder width apart and tighten your abdominal muscles. Bend your knees and hips and keep your spine neutral. It is important to lift using the strength of your legs, not your back. Do not lock your knees straight out. Always ask for help to lift heavy or awkward objects. This information is not intended to replace advice given to you by your health care provider. Make sure you discuss any questions you have with your health care provider. Document Revised: 09/28/2022 Document Reviewed: 12/04/2021 Elsevier Patient Education  2024 [employer]  Low Back Sprain or Strain Rehab Ask your health care provider which exercises are safe for you. Do exercises exactly as told by your health care provider and adjust them as directed.  It is normal to feel mild stretching, pulling, tightness, or discomfort as you do these exercises. Stop right away if you feel sudden pain or your pain gets worse. Do not begin these exercises until told by your health care provider. Stretching and range-of-motion exercises These exercises warm up your muscles and joints and improve the movement and flexibility of your back. These exercises also help to relieve pain, numbness, and tingling. Lumbar rotation  Lie on your back on a firm bed or the floor with your knees bent. Straighten your arms out to your sides so each arm forms a 90-degree angle (right angle) with a side of your body. Slowly move (rotate) both of your knees to one side of your body until you feel a stretch in your lower back (lumbar). Try not to let your shoulders lift off the floor. Hold this  position for __________ seconds. Tense your abdominal muscles and slowly move your knees back to the starting position. Repeat this exercise on the other side of your body. Repeat __________ times. Complete this exercise __________ times a day. Single knee to chest  Lie on your back on a firm bed or the floor with both legs straight. Bend one of your knees. Use your hands to move your knee up toward your chest until you feel a gentle stretch in your lower back and buttock. Hold your leg in this position by holding on to the front of your knee. Keep your other leg as straight as possible. Hold this position for __________ seconds. Slowly return to the starting position. Repeat with your other leg. Repeat __________ times. Complete this exercise __________ times a day. Prone extension on elbows  Lie on your abdomen on a firm bed or the floor (prone position). Prop yourself up on your elbows. Use your arms to help lift your chest up until you feel a gentle stretch in your abdomen and your lower back. This will place some of your body weight on your elbows. If this is uncomfortable, try stacking pillows under your chest. Your hips should stay down, against the surface that you are lying on. Keep your hip and back muscles relaxed. Hold this position for __________ seconds. Slowly relax your upper body and return to the starting position. Repeat __________ times. Complete this exercise __________ times a day. Strengthening exercises These exercises build strength and endurance in your back. Endurance is the ability to use your muscles for a long time, even after they get tired. Pelvic tilt This exercise strengthens the muscles that lie deep in the abdomen. Lie on your back on a firm bed or the floor with your legs extended. Bend your knees so they are pointing toward the ceiling and your feet are flat on the floor. Tighten your lower abdominal muscles to press your lower back against the  floor. This motion will tilt your pelvis so your tailbone points up toward the ceiling instead of pointing to your feet or the floor. To help with this exercise, you may place a small towel under your lower back and try to push your back into the towel. Hold this position for __________ seconds. Let your muscles relax completely before you repeat this exercise. Repeat __________ times. Complete this exercise __________ times a day. Alternating arm and leg raises  Get on your hands and knees on a firm surface. If you are on a hard floor, you may want to use padding, such as an exercise mat, to cushion your knees. Line up your  arms and legs. Your hands should be directly below your shoulders, and your knees should be directly below your hips. Lift your left leg behind you. At the same time, raise your right arm and straighten it in front of you. Do not lift your leg higher than your hip. Do not lift your arm higher than your shoulder. Keep your abdominal and back muscles tight. Keep your hips facing the ground. Do not arch your back. Keep your balance carefully, and do not hold your breath. Hold this position for __________ seconds. Slowly return to the starting position. Repeat with your right leg and your left arm. Repeat __________ times. Complete this exercise __________ times a day. Abdominal set with straight leg raise  Lie on your back on a firm bed or the floor. Bend one of your knees and keep your other leg straight. Tense your abdominal muscles and lift your straight leg up, 4-6 inches (10-15 cm) off the ground. Keep your abdominal muscles tight and hold this position for __________ seconds. Do not hold your breath. Do not arch your back. Keep it flat against the ground. Keep your abdominal muscles tense as you slowly lower your leg back to the starting position. Repeat with your other leg. Repeat __________ times. Complete this exercise __________ times a day. Single leg lower  with bent knees Lie on your back on a firm bed or the floor. Tense your abdominal muscles and lift your feet off the floor, one foot at a time, so your knees and hips are bent in 90-degree angles (right angles). Your knees should be over your hips and your lower legs should be parallel to the floor. Keeping your abdominal muscles tense and your knee bent, slowly lower one of your legs so your toe touches the ground. Lift your leg back up to return to the starting position. Do not hold your breath. Do not let your back arch. Keep your back flat against the ground. Repeat with your other leg. Repeat __________ times. Complete this exercise __________ times a day. Posture and body mechanics Good posture and healthy body mechanics can help to relieve stress in your body's tissues and joints. Body mechanics refers to the movements and positions of your body while you do your daily activities. Posture is part of body mechanics. Good posture means: Your spine is in its natural S-curve position (neutral). Your shoulders are pulled back slightly. Your head is not tipped forward (neutral). Follow these guidelines to improve your posture and body mechanics in your everyday activities. Standing  When standing, keep your spine neutral and your feet about hip-width apart. Keep a slight bend in your knees. Your ears, shoulders, and hips should line up. When you do a task in which you stand in one place for a long time, place one foot up on a stable object that is 2-4 inches (5-10 cm) high, such as a footstool. This helps keep your spine neutral. Sitting  When sitting, keep your spine neutral and keep your feet flat on the floor. Use a footrest, if necessary, and keep your thighs parallel to the floor. Avoid rounding your shoulders, and avoid tilting your head forward. When working at a desk or a computer, keep your desk at a height where your hands are slightly lower than your elbows. Slide your chair under  your desk so you are close enough to maintain good posture. When working at a computer, place your monitor at a height where you are looking straight ahead and  you do not have to tilt your head forward or downward to look at the screen. Resting When lying down and resting, avoid positions that are most painful for you. If you have pain with activities such as sitting, bending, stooping, or squatting, lie in a position in which your body does not bend very much. For example, avoid curling up on your side with your arms and knees near your chest (fetal position). If you have pain with activities such as standing for a long time or reaching with your arms, lie with your spine in a neutral position and bend your knees slightly. Try the following positions: Lying on your side with a pillow between your knees. Lying on your back with a pillow under your knees. Lifting  When lifting objects, keep your feet at least shoulder-width apart and tighten your abdominal muscles. Bend your knees and hips and keep your spine neutral. It is important to lift using the strength of your legs, not your back. Do not lock your knees straight out. Always ask for help to lift heavy or awkward objects. This information is not intended to replace advice given to you by your health care provider. Make sure you discuss any questions you have with your health care provider. Document Revised: 08/20/2022 Document Reviewed: 07/04/2020 Elsevier Patient Education  2024 [employer].

## 2024-10-20 ENCOUNTER — Ambulatory Visit: Admitting: Rheumatology
